# Patient Record
Sex: Male | Born: 1965 | Race: White | Hispanic: No | Marital: Single | Smoking: Never smoker
Health system: Southern US, Community
[De-identification: ages and names within clinical notes are randomized; demographics above are authoritative.]

## PROBLEM LIST (undated history)

## (undated) DIAGNOSIS — E785 Hyperlipidemia, unspecified: Secondary | ICD-10-CM

## (undated) DIAGNOSIS — E7800 Pure hypercholesterolemia, unspecified: Secondary | ICD-10-CM

## (undated) DIAGNOSIS — M549 Dorsalgia, unspecified: Secondary | ICD-10-CM

## (undated) DIAGNOSIS — I2510 Atherosclerotic heart disease of native coronary artery without angina pectoris: Secondary | ICD-10-CM

## (undated) HISTORY — DX: Pure hypercholesterolemia, unspecified: E78.00

## (undated) HISTORY — PX: BACK SURGERY: SHX140

## (undated) HISTORY — PX: SHUNT REPLACEMENT: SHX5403

## (undated) HISTORY — PX: CLUB FOOT RELEASE: SHX1363

---

## 1999-12-01 ENCOUNTER — Encounter: Admission: RE | Admit: 1999-12-01 | Discharge: 1999-12-01 | Payer: Self-pay | Admitting: Neurosurgery

## 1999-12-09 ENCOUNTER — Inpatient Hospital Stay (HOSPITAL_COMMUNITY): Admission: RE | Admit: 1999-12-09 | Discharge: 1999-12-11 | Payer: Self-pay | Admitting: Neurosurgery

## 1999-12-10 ENCOUNTER — Encounter: Payer: Self-pay | Admitting: Neurological Surgery

## 2000-01-01 ENCOUNTER — Encounter: Admission: RE | Admit: 2000-01-01 | Discharge: 2000-01-01 | Payer: Self-pay | Admitting: Neurosurgery

## 2000-02-12 ENCOUNTER — Encounter: Admission: RE | Admit: 2000-02-12 | Discharge: 2000-02-12 | Payer: Self-pay | Admitting: Neurosurgery

## 2000-04-15 ENCOUNTER — Encounter: Admission: RE | Admit: 2000-04-15 | Discharge: 2000-04-15 | Payer: Self-pay | Admitting: Neurosurgery

## 2000-07-15 ENCOUNTER — Encounter: Admission: RE | Admit: 2000-07-15 | Discharge: 2000-07-15 | Payer: Self-pay | Admitting: Neurosurgery

## 2001-01-17 ENCOUNTER — Encounter: Admission: RE | Admit: 2001-01-17 | Discharge: 2001-01-17 | Payer: Self-pay | Admitting: Neurosurgery

## 2002-02-06 ENCOUNTER — Emergency Department (HOSPITAL_COMMUNITY): Admission: EM | Admit: 2002-02-06 | Discharge: 2002-02-06 | Payer: Self-pay | Admitting: Emergency Medicine

## 2006-06-15 ENCOUNTER — Emergency Department (HOSPITAL_COMMUNITY): Admission: EM | Admit: 2006-06-15 | Discharge: 2006-06-15 | Payer: Self-pay | Admitting: Emergency Medicine

## 2006-07-04 ENCOUNTER — Emergency Department (HOSPITAL_COMMUNITY): Admission: EM | Admit: 2006-07-04 | Discharge: 2006-07-04 | Payer: Self-pay | Admitting: Emergency Medicine

## 2006-07-06 ENCOUNTER — Emergency Department (HOSPITAL_COMMUNITY): Admission: EM | Admit: 2006-07-06 | Discharge: 2006-07-06 | Payer: Self-pay | Admitting: Emergency Medicine

## 2006-07-08 ENCOUNTER — Ambulatory Visit (HOSPITAL_COMMUNITY): Admission: RE | Admit: 2006-07-08 | Discharge: 2006-07-08 | Payer: Self-pay | Admitting: Urology

## 2006-11-26 ENCOUNTER — Emergency Department (HOSPITAL_COMMUNITY): Admission: EM | Admit: 2006-11-26 | Discharge: 2006-11-26 | Payer: Self-pay | Admitting: Emergency Medicine

## 2010-11-15 ENCOUNTER — Encounter: Payer: Self-pay | Admitting: Urology

## 2010-11-15 ENCOUNTER — Encounter: Payer: Self-pay | Admitting: Family Medicine

## 2012-10-02 ENCOUNTER — Observation Stay (HOSPITAL_COMMUNITY)
Admission: EM | Admit: 2012-10-02 | Discharge: 2012-10-02 | Disposition: A | Discharge status: Home / Self Care | Payer: Medicare Other | Attending: Emergency Medicine | Admitting: Emergency Medicine

## 2012-10-02 ENCOUNTER — Emergency Department (HOSPITAL_COMMUNITY): Payer: Medicare Other

## 2012-10-02 ENCOUNTER — Encounter (HOSPITAL_COMMUNITY): Payer: Self-pay | Admitting: Emergency Medicine

## 2012-10-02 ENCOUNTER — Observation Stay (HOSPITAL_COMMUNITY): Payer: Medicare Other

## 2012-10-02 DIAGNOSIS — R0602 Shortness of breath: Secondary | ICD-10-CM | POA: Insufficient documentation

## 2012-10-02 DIAGNOSIS — I2510 Atherosclerotic heart disease of native coronary artery without angina pectoris: Secondary | ICD-10-CM

## 2012-10-02 DIAGNOSIS — Z79899 Other long term (current) drug therapy: Secondary | ICD-10-CM | POA: Insufficient documentation

## 2012-10-02 DIAGNOSIS — R911 Solitary pulmonary nodule: Secondary | ICD-10-CM

## 2012-10-02 DIAGNOSIS — Z7982 Long term (current) use of aspirin: Secondary | ICD-10-CM | POA: Insufficient documentation

## 2012-10-02 DIAGNOSIS — R079 Chest pain, unspecified: Principal | ICD-10-CM | POA: Insufficient documentation

## 2012-10-02 HISTORY — DX: Dorsalgia, unspecified: M54.9

## 2012-10-02 LAB — CBC
HCT: 37.3 % — ABNORMAL LOW (ref 39.0–52.0)
Hemoglobin: 12.3 g/dL — ABNORMAL LOW (ref 13.0–17.0)
MCHC: 33 g/dL (ref 30.0–36.0)
MCV: 74.7 fL — ABNORMAL LOW (ref 78.0–100.0)
RDW: 16 % — ABNORMAL HIGH (ref 11.5–15.5)

## 2012-10-02 LAB — POCT I-STAT, CHEM 8
Creatinine, Ser: 0.8 mg/dL (ref 0.50–1.35)
HCT: 39 % (ref 39.0–52.0)
Hemoglobin: 13.3 g/dL (ref 13.0–17.0)
Potassium: 3.3 mEq/L — ABNORMAL LOW (ref 3.5–5.1)
Sodium: 134 mEq/L — ABNORMAL LOW (ref 135–145)
TCO2: 25 mmol/L (ref 0–100)

## 2012-10-02 LAB — POCT I-STAT TROPONIN I
Troponin i, poc: 0 ng/mL (ref 0.00–0.08)
Troponin i, poc: 0.01 ng/mL (ref 0.00–0.08)
Troponin i, poc: 0 ng/mL (ref 0.00–0.08)

## 2012-10-02 LAB — BASIC METABOLIC PANEL
BUN: 11 mg/dL (ref 6–23)
Chloride: 96 mEq/L (ref 96–112)
Creatinine, Ser: 0.85 mg/dL (ref 0.50–1.35)
GFR calc Af Amer: >90 mL/min (ref >90)
GFR calc non Af Amer: >90 mL/min (ref >90)
Glucose, Bld: 122 mg/dL — ABNORMAL HIGH (ref 70–99)
Potassium: 3.2 mEq/L — ABNORMAL LOW (ref 3.5–5.1)

## 2012-10-02 LAB — D-DIMER, QUANTITATIVE: D-Dimer, Quant: <0.27 ug/mL-FEU (ref 0.00–0.48)

## 2012-10-02 LAB — PROTIME-INR: INR: 1.04 (ref 0.00–1.49)

## 2012-10-02 MED ORDER — IOHEXOL 350 MG/ML SOLN
80.0000 mL | Freq: Once | INTRAVENOUS | Status: AC | PRN
  Administered 2012-10-02: 80 mL via INTRAVENOUS

## 2012-10-02 MED ORDER — NITROGLYCERIN 0.4 MG SL SUBL
SUBLINGUAL_TABLET | SUBLINGUAL | Status: AC
  Administered 2012-10-02: 16:00:00
  Filled 2012-10-02: qty 25

## 2012-10-02 MED ORDER — ASPIRIN 81 MG PO CHEW
324.0000 mg | CHEWABLE_TABLET | Freq: Once | ORAL | Status: AC
  Administered 2012-10-02: 324 mg via ORAL
  Filled 2012-10-02: qty 4

## 2012-10-02 MED ORDER — SODIUM CHLORIDE 0.9 % IV SOLN: Freq: Once | INTRAVENOUS | Status: DC

## 2012-10-02 MED ORDER — SODIUM CHLORIDE 0.9 % IV SOLN
Freq: Once | INTRAVENOUS | Status: AC
  Administered 2012-10-02: 04:00:00 via INTRAVENOUS

## 2012-10-02 MED ORDER — METOPROLOL TARTRATE 25 MG PO TABS
50.0000 mg | ORAL_TABLET | Freq: Once | ORAL | Status: AC
  Administered 2012-10-02: 50 mg via ORAL
  Filled 2012-10-02: qty 2

## 2012-10-02 MED ORDER — MORPHINE SULFATE 4 MG/ML IJ SOLN: 4.0000 mg | INTRAMUSCULAR | Status: DC | PRN

## 2012-10-02 MED ORDER — MORPHINE SULFATE 4 MG/ML IJ SOLN
4.0000 mg | Freq: Once | INTRAMUSCULAR | Status: AC
  Administered 2012-10-02: 4 mg via INTRAVENOUS
  Filled 2012-10-02: qty 1

## 2012-10-02 MED ORDER — METOPROLOL TARTRATE 25 MG PO TABS
100.0000 mg | ORAL_TABLET | Freq: Once | ORAL | Status: AC
  Administered 2012-10-02: 100 mg via ORAL
  Filled 2012-10-02: qty 4

## 2012-10-02 MED ORDER — ONDANSETRON HCL 4 MG/2ML IJ SOLN: 4.0000 mg | Freq: Once | INTRAMUSCULAR | Status: DC

## 2012-10-02 MED ORDER — METOPROLOL TARTRATE 1 MG/ML IV SOLN
5.0000 mg | Freq: Once | INTRAVENOUS | Status: DC
  Filled 2012-10-02: qty 5

## 2012-10-02 NOTE — ED Notes (Signed)
Family at bedside. 

## 2012-10-02 NOTE — ED Notes (Signed)
Patient is alert and orientedx4.  Patient was explained discharge instructions and he understood them.  Patient's sister, Minerva Ends was also present during the explanation.  One of his family members are transporting him home.

## 2012-10-02 NOTE — ED Notes (Signed)
C/o pressure in center of chest that started 30 min ago.  States he also had pain a couple nights ago.  Denies nausea, vomiting, sob, or any other symptoms.

## 2012-10-02 NOTE — ED Notes (Signed)
Medications ordered at 1730 not given, [place], PA discharged the patient.

## 2012-10-02 NOTE — ED Provider Notes (Signed)
Cody Preston is a 46 y.o. male transferred to CDU from Pod B. Signout from [facility] as follows: Patient with chest pain at rest onset this a.m. he has no cardiac risk factors except for a strong family history. He is on CDU chest pain protocol pending coronary CT.  Verbal report from radiologist Dr. entered and appreciated: He states that the patient has extensive but nonobstructive coronary artery disease with a calcium score of over 500 this is in the 99th percentile. This does not account for the patient's symptoms. Incidentally a 6 mm right middle lobe pulmonary nodule is noted. Patient will be advised to follow with pulmonology and have a repeat chest CT in 12 months.  Cardiology consult from Dr. Sharyn Lull appreciated: He feels no emergent intervention or hospitalization is indicated at this time. He would like to see the patient in his office next week. Advises pulmonary followup for nodule.  Pt seen and examined at the bedside he is resting comfortably with his family at the bedside. He is currently chest pain-free. Physical exam shows heart rate is a regular rate and rhythm, lung sounds are clear to auscultation, abdominal exam is benign.  Results discussed with patient and family.  Followup with Dr. Sharyn Lull in the next week and and pulmonology referral for repeat chest CT in 6 months to evaluate pulmonary nodule as patient has risk factors of asbestos exposure.   Pt has been advised return to the ED if develop any exertional CP- strict return precautions discussed & patient's questions answered. Pt appears reliable for follow up and is agreeable to discharge.   Results for orders placed during the hospital encounter of 10/02/12  CBC      Component Value Range   WBC 6.1  4.0 - 10.5 K/uL   RBC 4.99  4.22 - 5.81 MIL/uL   Hemoglobin 12.3 (*) 13.0 - 17.0 g/dL   HCT 82.9 (*) 56.2 - 13.0 %   MCV 74.7 (*) 78.0 - 100.0 fL   MCH 24.6 (*) 26.0 - 34.0 pg   MCHC 33.0  30.0 - 36.0 g/dL   RDW 86.5 (*)  78.4 - 15.5 %   Platelets 438 (*) 150 - 400 K/uL  BASIC METABOLIC PANEL      Component Value Range   Sodium 132 (*) 135 - 145 mEq/L   Potassium 3.2 (*) 3.5 - 5.1 mEq/L   Chloride 96  96 - 112 mEq/L   CO2 27  19 - 32 mEq/L   Glucose, Bld 122 (*) 70 - 99 mg/dL   BUN 11  6 - 23 mg/dL   Creatinine, Ser 6.96  0.50 - 1.35 mg/dL   Calcium 9.1  8.4 - 29.5 mg/dL   GFR calc non Af Amer >90  >90 mL/min   GFR calc Af Amer >90  >90 mL/min  APTT      Component Value Range   aPTT 35  24 - 37 seconds  PROTIME-INR      Component Value Range   Prothrombin Time 13.5  11.6 - 15.2 seconds   INR 1.04  0.00 - 1.49  D-DIMER, QUANTITATIVE      Component Value Range   D-Dimer, Quant <0.27  0.00 - 0.48 ug/mL-FEU  POCT I-STAT TROPONIN I      Component Value Range   Troponin i, poc 0.00  0.00 - 0.08 ng/mL   Comment 3           POCT I-STAT, CHEM 8  Component Value Range   Sodium 134 (*) 135 - 145 mEq/L   Potassium 3.3 (*) 3.5 - 5.1 mEq/L   Chloride 97  96 - 112 mEq/L   BUN 11  6 - 23 mg/dL   Creatinine, Ser 8.11  0.50 - 1.35 mg/dL   Glucose, Bld 914 (*) 70 - 99 mg/dL   Calcium, Ion 7.82 (*) 1.12 - 1.23 mmol/L   TCO2 25  0 - 100 mmol/L   Hemoglobin 13.3  13.0 - 17.0 g/dL   HCT 95.6  21.3 - 08.6 %  POCT I-STAT TROPONIN I      Component Value Range   Troponin i, poc 0.01  0.00 - 0.08 ng/mL   Comment 3           POCT I-STAT TROPONIN I      Component Value Range   Troponin i, poc 0.00  0.00 - 0.08 ng/mL   Comment 3            Ct Heart Morp W/cta Cor W/score W/ca W/cm &/or Wo/cm  10/02/2012  *RADIOLOGY REPORT*  INDICATION: 46 year old male with history of intermittent chest pressure.  CT ANGIOGRAPHY OF THE HEART, CORONARY ARTERY, STRUCTURE, AND MORPHOLOGY  COMPARISON:  No priors.  CONTRAST: 80mL OMNIPAQUE IOHEXOL 350 MG/ML SOLN  TECHNIQUE:  CT angiography of the coronary vessels was performed on a 256 channel system using prospective ECG gating.  A scout and ECG- gated noncontrast exam (for calcium  scoring) were performed. Appropriate delay was determined by bolus tracking after injection of iodinated contrast, and an ECG-gated coronary CTA was performed with sub-mm slice collimation during late diastole.  Imaging post processing was performed on an independent workstation creating multiplanar and 3-D images, allowing for quantitative analysis of the heart and coronary arteries.  Note that this exam targets the heart and the chest was not imaged in its entirety.  PREMEDICATION: Lopressor  150 mg, P.O. Nitroglycerin  400 mcg, sublingual.  FINDINGS: Technical quality: Excellent. Heart rate:  62.  CORONARY ARTERIES: Left Main:              Mildly diseased with predominately noncalcified plaque resulting in 0-25% stenosis. LAD:              Heavily diseased with a larger burden of the calcified and noncalcified atherosclerotic plaque with areas of stenosis of 25-50% in the mid LAD.  A short segment of shallow myocardial bridging the distal LAD without any associated luminal narrowing of the tunneled segment of the vessel on these diastolic images. Diagonals:              Dimminutive D1, negative for atherosclerosis.  Large-caliber D2 with mild disease at the ostium resulting in 25-50% stenosis. LCx:              Mixed plaque at the ostium resulting in 0-25% stenosis. OMs:              Large-caliber OM1 with mixed plaque in the proximal vessel resulting in 25-50% stenosis.  Small caliber OM2, negative for atherosclerosis. RCA:              Moderately diseased with mixed calcified and noncalcified atherosclerotic plaque with areas of stenosis of 25- 50% in the mid RCA. PDA:                    Negative. Dominance:        Right.  CORONARY CALCIUM:  Total Agatston Score:  528 MESA database percentile:     99th  AORTA AND PULMONARY MEASUREMENTS:  Aortic root (21 - 40 mm):     26 mm at the annulus                               35 mm at the sinuses of Valsalva                               24 mm at the sinotubular   junction                                Ascending aorta:  (< 40 mm):  27 mm Descending aorta:  (< 40 mm): 21 mm Main pulmonary artery: (< 30 mm): 24 mm  OTHER FINDINGS: 6 mm subpleural nodule in the right middle lobe (image 22 of series 5).  Extensive scarring in the lung bases bilaterally (right lower lobe greater than left).  No acute consolidative air space disease. No pleural effusions.  Small amount pleural thickening posteriorly bilaterally (right greater than left), likely part of the underlying scarring.  Visualized portions of the upper abdomen are unremarkable.  There are no aggressive appearing lytic or blastic lesions noted in the visualized portions of the skeleton.  IMPRESSION: 1.  Extensive age advanced nonobstructive coronary artery disease, as detailed above.  The patient's total coronary artery calcium score is 528 which is 99th percentile for patient's of matched age, gender and race/ethnicity. 2.  No acute findings in the thorax to account for the patient's symptoms. 3.  6 mm subpleural nodule in the right middle lobe.  This is nonspecific, but does warrant attention on follow-up studies. If the patient is at high risk for bronchogenic carcinoma, follow-up chest CT at 6-12 months is recommended.  If the patient is at low risk for bronchogenic carcinoma, follow-up chest CT at 12 months is recommended.  This recommendation follows the consensus statement: Guidelines for Management of Small Pulmonary Nodules Detected on CT Scans: A Statement from the Fleischner Society as published in Radiology 2005; 237:395-400. 4.  Right coronary artery dominance.  Report was called to CDU mid level at [phone] at 04:45 p.m. on 10/02/2012.   Original Report Authenticated By: Trudie Reed, M.D.    Dg Chest Port 1 View  10/02/2012  *RADIOLOGY REPORT*  Clinical Data: Chest pain  PORTABLE CHEST - 1 VIEW  Comparison: None.  Findings: Linear atelectasis or fibrosis in the right lung base. The heart size and  pulmonary vascularity are normal. The lungs appear clear and expanded without focal air space disease or consolidation. No blunting of the costophrenic angles.  No pneumothorax.  Mediastinal contours appear intact.  IMPRESSION: No evidence of active pulmonary disease.  Linear atelectasis or fibrosis in the right lung base.   Original Report Authenticated By: Burman Nieves, M.D.         Wynetta Emery, PA-C 10/02/12 1755

## 2012-10-02 NOTE — Progress Notes (Signed)
Utilization review completed.  P.J. Onalee Steinbach,RN,BSN Case Manager [phone]  

## 2012-10-02 NOTE — ED Notes (Signed)
Cardiology paged.  

## 2012-10-02 NOTE — ED Notes (Signed)
Per MD pt to be given 50mg  lopressor orally and ct scan to be done at 3pm today

## 2012-10-02 NOTE — ED Notes (Signed)
Pt ambulated to restroom without assistance.

## 2012-10-02 NOTE — ED Notes (Addendum)
Into room via w/c, EDP at [facility], alert, NAD, calm, interactive, skin W&D, hands cold, resps e/u, c/o CP, onset 2d ago, comes and goes, describes as pressure,  worse when lying down, "feels better now than previously", h/o chronic back pain, officially disabled, "had just gone to bed around 0315". (Denies other sx), tender to sternal palpation, reproduceable. PCP Dr. Loleta Chance. Mentions h/o PE. Family h/o MI.

## 2012-10-02 NOTE — ED Notes (Signed)
Xray at BS 

## 2012-10-02 NOTE — ED Provider Notes (Signed)
History     CSN: [id number]  Arrival date & time 10/02/12  0348   First MD Initiated Contact with Patient 10/02/12 0356      Chief Complaint  Patient presents with  . Chest Pain    (Consider location/radiation/quality/duration/timing/severity/associated sxs/prior treatment) HPI Comments: Pt is a 46 y/o male who states that he has a hx of Fractured back and Pelvis, PE (after back fracture), and a very strong hx of ACS in the family with multiple family members who have had MI's.  He states that he has had a heaviness in his chest which has been intermittent, started 3 days ago but became more intense this evening prompting his visit.  He has had mild SOB with this but no nausea or diaphoresis.  He has taken Hydrocodone prior to arrival with good relief of his pain and he has very mild pain at this time.  He denies f/c/n/v/cough/swelling / trauma / travel / immobilization / hormone use or hx of CA.  PMD: Mirna Mires  Patient is a 46 y.o. male presenting with chest pain. The history is provided by the patient and medical records.  Chest Pain     Past Medical History  Diagnosis Date  . Back pain     Past Surgical History  Procedure Date  . Back surgery     No family history on file.  History  Substance Use Topics  . Smoking status: Never Smoker   . Smokeless tobacco: Not on file  . Alcohol Use: No      Review of Systems  Cardiovascular: Positive for chest pain.  All other systems reviewed and are negative.    Allergies  Review of patient's allergies indicates no known allergies.  Home Medications   Current Outpatient Rx  Name  Route  Sig  Dispense  Refill  . ASPIRIN EC 81 MG PO TBEC   Oral   Take 81 mg by mouth daily.         . IBUPROFEN 200 MG PO TABS   Oral   Take 200 mg by mouth every 6 (six) hours as needed. For pain         . TRAMADOL HCL 50 MG PO TABS   Oral   Take 50 mg by mouth every 8 (eight) hours as needed. For back pain            BP 132/92  Pulse 83  Temp 98.4 F (36.9 C) (Oral)  Resp 18  SpO2 100%  Physical Exam  Nursing note and vitals reviewed. Constitutional: He appears well-developed and well-nourished. No distress.  HENT:  Head: Normocephalic and atraumatic.  Mouth/Throat: Oropharynx is clear and moist. No oropharyngeal exudate.       Small non tender non erythematous growth to the L anterior chin (states has been there for some time)  Eyes: Conjunctivae normal and EOM are normal. Pupils are equal, round, and reactive to light. Right eye exhibits no discharge. Left eye exhibits no discharge. No scleral icterus.  Neck: Normal range of motion. Neck supple. No JVD present. No thyromegaly present.  Cardiovascular: Normal rate, regular rhythm, normal heart sounds and intact distal pulses.  Exam reveals no gallop and no friction rub.   No murmur heard. Pulmonary/Chest: Effort normal and breath sounds normal. No respiratory distress. He has no wheezes. He has no rales.  Abdominal: Soft. Bowel sounds are normal. He exhibits no distension and no mass. There is no tenderness.  Musculoskeletal: Normal range of motion. He exhibits  no edema and no tenderness.  Lymphadenopathy:    He has no cervical adenopathy.  Neurological: He is alert. Coordination normal.  Skin: Skin is warm and dry. No rash noted. No erythema.  Psychiatric: He has a normal mood and affect. His behavior is normal.    ED Course  Procedures (including critical care time)  Labs Reviewed  CBC - Abnormal; Notable for the following:    Hemoglobin 12.3 (*)     HCT 37.3 (*)     MCV 74.7 (*)     MCH 24.6 (*)     RDW 16.0 (*)     Platelets 438 (*)     All other components within normal limits  BASIC METABOLIC PANEL - Abnormal; Notable for the following:    Sodium 132 (*)     Potassium 3.2 (*)     Glucose, Bld 122 (*)     All other components within normal limits  POCT I-STAT, CHEM 8 - Abnormal; Notable for the following:    Sodium 134  (*)     Potassium 3.3 (*)     Glucose, Bld 124 (*)     Calcium, Ion 1.11 (*)     All other components within normal limits  APTT  PROTIME-INR  D-DIMER, QUANTITATIVE  POCT I-STAT TROPONIN I  POCT I-STAT TROPONIN I  POCT I-STAT TROPONIN I   Dg Chest Port 1 View  10/02/2012  *RADIOLOGY REPORT*  Clinical Data: Chest pain  PORTABLE CHEST - 1 VIEW  Comparison: None.  Findings: Linear atelectasis or fibrosis in the right lung base. The heart size and pulmonary vascularity are normal. The lungs appear clear and expanded without focal air space disease or consolidation. No blunting of the costophrenic angles.  No pneumothorax.  Mediastinal contours appear intact.  IMPRESSION: No evidence of active pulmonary disease.  Linear atelectasis or fibrosis in the right lung base.   Original Report Authenticated By: Burman Nieves, M.D.      1. Chest pain       MDM  The pt has no edema, he does have a tachycardia on the monitor to 105, his ECG is non ischemic but BP is elevated at 145/90, and the rest of his exam is benign.  Will need to r/o PE, but has strong FHx of ACS - would benefit from r/o if not a PE.   Pt does not have any sx at this time - he has had 2 neg sets of CE's but due to his FHx he will receive a Cardiac Coronary CT this AM.  Holding orders written.  Change of shift - care signed out to oncoming physician and PA     Vida Roller, MD 10/02/12 [phone]

## 2012-10-03 NOTE — ED Provider Notes (Signed)
Medical screening examination/treatment/procedure(s) were performed by non-physician practitioner and as supervising physician I was immediately available for consultation/collaboration.   Glynn Octave, MD 10/03/12 [phone]

## 2012-12-28 ENCOUNTER — Telehealth: Payer: Self-pay | Admitting: Internal Medicine

## 2012-12-28 NOTE — Telephone Encounter (Signed)
Patient called stating he needed to be seen because of his heart.  I have looked through patients chart and according to [facility] in ED patient had cardiac workup and CT done which showed a pulmonary nodule and rec patient see pulmonologist. Appt scheduled for 12/29/12 @ 145pm w Dr.Wert.

## 2012-12-29 ENCOUNTER — Institutional Professional Consult (permissible substitution): Payer: 59 | Admitting: Internal Medicine

## 2013-01-11 ENCOUNTER — Encounter: Payer: Self-pay | Admitting: Cardiology

## 2013-01-16 ENCOUNTER — Encounter: Payer: Self-pay | Admitting: Cardiovascular Disease

## 2013-01-16 ENCOUNTER — Ambulatory Visit (INDEPENDENT_AMBULATORY_CARE_PROVIDER_SITE_OTHER): Payer: Medicare Other | Admitting: Cardiovascular Disease

## 2013-01-16 VITALS — BP 136/86 | HR 112 | Ht 67.0 in | Wt 153.2 lb

## 2013-01-16 DIAGNOSIS — E785 Hyperlipidemia, unspecified: Secondary | ICD-10-CM

## 2013-01-16 DIAGNOSIS — E789 Disorder of lipoprotein metabolism, unspecified: Secondary | ICD-10-CM | POA: Insufficient documentation

## 2013-01-16 DIAGNOSIS — I2510 Atherosclerotic heart disease of native coronary artery without angina pectoris: Secondary | ICD-10-CM

## 2013-01-16 DIAGNOSIS — I25119 Atherosclerotic heart disease of native coronary artery with unspecified angina pectoris: Secondary | ICD-10-CM | POA: Insufficient documentation

## 2013-01-16 MED ORDER — SIMVASTATIN 20 MG PO TABS: 20.0000 mg | ORAL_TABLET | Freq: Every day | ORAL | Status: DC

## 2013-01-16 NOTE — Progress Notes (Signed)
Patient ID: Cody Preston, male   DOB: October 14, 1966, 47 y.o.   MRN: [id number] 47 yo with positive family history of CAD.  Seen in ER 12/13 for chest pain.  R/O ECG normal. Reviewed cardiac CT read by Dr George Ina. Calcium score over 500 but no obstructive disease. Has not had SSCP since. Concerned about his heart. Primary ? Mirna Mires.  Says he's had lipids checked but not on statin. Nonsmoker non diabetic He is sedentary and does not work. Larey Seat form 30 feet 15 years ago and has back problems.    ROS: Denies fever, malais, weight loss, blurry vision, decreased visual acuity, cough, sputum, SOB, hemoptysis, pleuritic pain, palpitaitons, heartburn, abdominal pain, melena, lower extremity edema, claudication, or rash.  All other systems reviewed and negative   General: Affect appropriate Healthy:  appears stated age HEENT: normal poor dentition Neck supple with no adenopathy JVP normal no bruits no thyromegaly Lungs clear with no wheezing and good diaphragmatic motion Heart:  S1/S2 no murmur,rub, gallop or click PMI normal Abdomen: benighn, BS positve, no tenderness, no AAA no bruit.  No HSM or HJR Distal pulses intact with no bruits No edema Neuro non-focal Skin warm and dry No muscular weakness  Medications Current Outpatient Prescriptions  Medication Sig Dispense Refill  . Acetaminophen (ARTHRITIS PAIN RELIEF PO) Take by mouth.      Marland Kitchen aspirin EC 81 MG tablet Take 81 mg by mouth daily.      . traMADol (ULTRAM) 50 MG tablet Take 50 mg by mouth every 8 (eight) hours as needed. For back pain      . simvastatin (ZOCOR) 20 MG tablet Take 1 tablet (20 mg total) by mouth at bedtime.  90 tablet  2   No current facility-administered medications for this visit.    Allergies Review of patient's allergies indicates no known allergies.  Family History: No family history on file.  Social History: History   Social History  . Marital Status: Single    Spouse Name: N/A    Number of Children:  N/A  . Years of Education: N/A   Occupational History  . Not on file.   Social History Main Topics  . Smoking status: Never Smoker   . Smokeless tobacco: Not on file  . Alcohol Use: No  . Drug Use: No  . Sexually Active:    Other Topics Concern  . Not on file   Social History Narrative  . No narrative on file    Electrocardiogram: 10/03/12  NSR normal ECG  Assessment and Plan

## 2013-01-16 NOTE — Patient Instructions (Addendum)
Your physician wants you to follow-up in: 6 months with Dr Eden Emms. You will receive a reminder letter in the mail two months in advance. If you don't receive a letter, please call our office to schedule the follow-up appointment.  Your physician has recommended you make the following change in your medication: START Simvastatin 20 mg daily  Your physician recommends that you return for lab work in: 6-8 weeks (lipid and liver profiles)

## 2013-01-16 NOTE — Assessment & Plan Note (Signed)
Nonobstructive but calcium score 99% for age and sex matched controls Start statin check labs in 8 weeks and take ASA 81mg 

## 2013-01-16 NOTE — Assessment & Plan Note (Signed)
Start statin target LDL less than 70 labs in 8 weeks

## 2013-01-19 ENCOUNTER — Institutional Professional Consult (permissible substitution): Payer: 59 | Admitting: Cardiology

## 2013-01-24 ENCOUNTER — Institutional Professional Consult (permissible substitution): Payer: 59 | Admitting: Internal Medicine

## 2013-02-23 ENCOUNTER — Encounter: Payer: Self-pay | Admitting: Critical Care Medicine

## 2013-02-23 ENCOUNTER — Ambulatory Visit (INDEPENDENT_AMBULATORY_CARE_PROVIDER_SITE_OTHER): Payer: Medicare Other | Admitting: Critical Care Medicine

## 2013-02-23 ENCOUNTER — Institutional Professional Consult (permissible substitution): Payer: Medicare Other | Admitting: Internal Medicine

## 2013-02-23 VITALS — BP 138/90 | HR 94 | Temp 97.6°F | Ht 67.0 in | Wt 149.0 lb

## 2013-02-23 DIAGNOSIS — R911 Solitary pulmonary nodule: Secondary | ICD-10-CM | POA: Insufficient documentation

## 2013-02-23 NOTE — Patient Instructions (Signed)
OBtain CT CHest at [facility] location I will call with results

## 2013-02-23 NOTE — Progress Notes (Signed)
Subjective:    Patient ID: Cody Preston, male    DOB: 04/22/1966, Cody y.o.   MRN: [id number]  HPI Cody y.o. WM hx of lung nodule seen on coronary artery CT done 09/2012.  Cody Preston had recent abd CT that confirmed and referred for further Eval. 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 No real cough.  This will spasm.  No edema in feet.  No wheeze.  Notes some heartburn.   Never smoker.   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 Cody Preston also denies any obvious fluctuation in symptoms with  weather or environmental change or other alleviating or aggravating factors  Past Medical History  Diagnosis Date  . Back pain   . Hypercholesteremia      Family History  Problem Relation Age of Onset  . Allergies Sister   . Heart attack Mother   . Heart attack Brother   . Arthritis Mother   . Arthritis Sister   . Lung cancer Mother      History   Social History  . Marital Status: Single    Spouse Name: N/A    Number of Children: N/A  . Years of Education: N/A   Occupational History  . Retired    Social History Main Topics  . Smoking status: Never Smoker   . Smokeless tobacco: Never Used  . Alcohol Use: Yes     Comment: occas  . Drug Use: No  . Sexually Active: Not on file   Other Topics Concern  . Not on file   Social History Narrative  . No narrative on file     No Known Allergies   Outpatient Prescriptions Prior to Visit  Medication Sig Dispense Refill  . Acetaminophen (ARTHRITIS PAIN RELIEF PO) Take by mouth as needed.       Marland Kitchen aspirin EC 81  MG tablet Take 81 mg by mouth daily.      . simvastatin (ZOCOR) 20 MG tablet Take 1 tablet (20 mg total) by mouth at bedtime.  90 tablet  2  . traMADol (ULTRAM) 50 MG tablet Take 50 mg by mouth every 8 (eight) hours as needed. For back pain       No facility-administered medications prior to visit.      Review of Systems  Constitutional: Negative for fever, chills, diaphoresis, activity change, appetite change, fatigue and unexpected weight change.  HENT: Negative for hearing loss, ear pain, nosebleeds, congestion, sore throat, facial swelling, rhinorrhea, sneezing, mouth sores, trouble swallowing, neck pain, neck stiffness, dental problem, voice change, postnasal drip, sinus pressure, tinnitus and ear discharge.   Eyes: Negative for photophobia, discharge, itching and visual disturbance.  Respiratory: Negative for apnea, cough, choking, chest tightness, shortness of breath, wheezing and stridor.   Cardiovascular: Negative for chest pain, palpitations and leg swelling.  Gastrointestinal: Negative for nausea, vomiting, abdominal pain, constipation, blood in stool and abdominal distention.  Genitourinary: Negative for dysuria, urgency, frequency, hematuria, flank pain, decreased urine volume and difficulty urinating.  Musculoskeletal: Positive for back pain. Negative for myalgias, joint swelling, arthralgias and gait problem.  Skin: Negative for color  change, pallor and rash.  Neurological: Negative for dizziness, tremors, seizures, syncope, speech difficulty, weakness, light-headedness, numbness and headaches.  Hematological: Negative for adenopathy. Does not bruise/bleed easily.  Psychiatric/Behavioral: Negative for confusion, sleep disturbance and agitation. The patient is not nervous/anxious.        Objective:   Physical Exam Filed Vitals:   02/23/13 1631  BP: 138/90  Pulse: 94  Temp: 97.6 F (36.4 C)  TempSrc: Oral  Height: 5\' 7"  (1.702 m)  Weight: 149 lb (67.586 kg)  SpO2: 98%     Gen: Pleasant, well-nourished, in no distress,  normal affect  ENT: No lesions,  mouth clear,  oropharynx clear, no postnasal drip  Neck: No JVD, no TMG, no carotid bruits  Lungs: No use of accessory muscles, no dullness to percussion, clear without rales or rhonchi  Cardiovascular: RRR, heart sounds normal, no murmur or gallops, no peripheral edema  Abdomen: soft and NT, no HSM,  BS normal  Musculoskeletal: No deformities, no cyanosis or clubbing  Neuro: alert, mild left sided weakness in LUE  Skin: Warm, no lesions or rashes  No results found.   CT Chest 09/2012: done for CAD eval.  6mm anterior subpleural nodule RUL no other nodules or LAN seen     Assessment & Plan:   Lung nodule 6mm Anterior sub pleural nodule seen on ABD CT and Coronary artery CT 09/2012. 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 Acetaminophen (ARTHRITIS PAIN RELIEF PO) Take by mouth as needed.       Marland Kitchen aspirin EC 81 MG tablet Take 81 mg by mouth daily.      . simvastatin (ZOCOR) 20 MG tablet Take 1 tablet (20 mg total) by mouth at bedtime.  90 tablet  2  . traMADol (ULTRAM) 50 MG tablet Take 50 mg by mouth every 8 (eight) hours as needed. For back pain       No facility-administered encounter medications on file as of 02/23/2013.

## 2013-02-24 NOTE — Assessment & Plan Note (Signed)
6mm Anterior sub pleural nodule seen on ABD CT and Coronary artery CT 09/2012. 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

## 2013-02-27 ENCOUNTER — Other Ambulatory Visit: Payer: Medicare Other

## 2013-03-02 ENCOUNTER — Ambulatory Visit (INDEPENDENT_AMBULATORY_CARE_PROVIDER_SITE_OTHER)
Admission: RE | Admit: 2013-03-02 | Discharge: 2013-03-02 | Disposition: A | Discharge status: Home / Self Care | Payer: Medicare Other | Source: Ambulatory Visit | Attending: Critical Care Medicine | Admitting: Critical Care Medicine

## 2013-03-02 DIAGNOSIS — R911 Solitary pulmonary nodule: Secondary | ICD-10-CM

## 2013-03-06 NOTE — Progress Notes (Signed)
Quick Note:  lmomtcb for pt ______ 

## 2013-03-08 NOTE — Progress Notes (Signed)
Quick Note:  Called, spoke with pt. Informed him of CT Chest results and recs per Dr. Delford Field. He verbalized understanding and voiced no further questions or concerns at this time. He is aware we will contact him closer to when scan is due next year to schedule. Dr. Delford Field has reminder for this in his box. ______

## 2013-03-29 ENCOUNTER — Ambulatory Visit: Payer: Medicare Other | Admitting: Critical Care Medicine

## 2014-03-07 ENCOUNTER — Telehealth: Payer: Self-pay | Admitting: *Deleted

## 2014-03-07 DIAGNOSIS — R911 Solitary pulmonary nodule: Secondary | ICD-10-CM

## 2014-03-07 NOTE — Telephone Encounter (Signed)
Message copied by Adalberto Ill on Thu Mar 07, 2014  1:53 PM ------      Message from: Asencion Noble E      Created: Thu Mar 07, 2014 11:33 AM       Needs f/u Ct chest on lung nodule, no contrast       ----- Message -----         From: Elsie Stain, MD         Sent: 03/05/2013   8:54 AM           To: Elsie Stain, MD            chk Ct chest       ------

## 2014-03-07 NOTE — Telephone Encounter (Signed)
ATC # listed as pt's home # - went directly to VM.  [facility] ATC # listed as cell #.  This # is not accepting calls at this time.  CT Chest order placed - will ned to inform pt PCCs will be calling to schedule this.

## 2014-03-11 NOTE — Telephone Encounter (Signed)
Looks like CT Chest is scheduled for June 8 ATC pt's home #- went directly to VM. lmomtcb ATC pt's cell # - went to [facility] stating # not accepting incoming calls. WCB

## 2014-03-12 NOTE — Telephone Encounter (Signed)
Called, spoke with pt. He is aware of pending CT Chest scheduled for June 8.  He is to call back if he has any further questions or concerns.  He voiced no further questions or concerns at this time.

## 2014-03-14 ENCOUNTER — Other Ambulatory Visit: Payer: Medicare Other

## 2014-04-01 ENCOUNTER — Other Ambulatory Visit: Payer: Medicare Other

## 2014-04-09 ENCOUNTER — Telehealth: Payer: Self-pay | Admitting: Critical Care Medicine

## 2014-04-14 NOTE — Telephone Encounter (Signed)
Opened in error

## 2014-06-04 ENCOUNTER — Inpatient Hospital Stay: Admission: RE | Admit: 2014-06-04 | Payer: Medicare Other | Source: Ambulatory Visit

## 2014-06-04 ENCOUNTER — Telehealth: Payer: Self-pay | Admitting: Critical Care Medicine

## 2014-06-04 NOTE — Telephone Encounter (Signed)
Yes. pls reschedule

## 2014-06-04 NOTE — Telephone Encounter (Signed)
PW do you want to set up a new appt date for CT?  Pt has cancelled the above dates for his CT>  thanks

## 2014-06-04 NOTE — Telephone Encounter (Signed)
Called pt's home #, [phone], it is not a working [employer] # listed as pt's cell # x 2, [phone], rang several times then received fast busy signal each call. WCB

## 2014-06-05 NOTE — Telephone Encounter (Signed)
ATC both home and cell #s Both numbers unavailable

## 2014-06-06 NOTE — Telephone Encounter (Signed)
ATC both home and cell #s again Both numbers unavailable

## 2014-06-07 NOTE — Telephone Encounter (Signed)
ATC again both home and cell #'s  Both NA

## 2014-06-11 NOTE — Telephone Encounter (Signed)
i have attempted to call pt again.  Both numbers are not working numbers at this time. Will sign off of this message at this time.

## 2014-06-20 ENCOUNTER — Encounter: Payer: Self-pay | Admitting: *Deleted

## 2014-06-20 NOTE — Telephone Encounter (Signed)
Noted  Pt not adherent to CT Scan followups.  Despite our efforts.

## 2014-06-20 NOTE — Telephone Encounter (Signed)
Called # listed as pt's cell # and pt's emergency contact #, [phone], received msg that the subscriber you are trying to call is not in service.  If you feel you have reached this msg in error please try your call again later. Called # listed as pt's home #, [phone], received msg that the number you have dialed is not a working number. There are no other contact numbers for pt in chart. I will sign off on msg and will send letter to address on file for pt.  Will send msg to Dr. Joya Gaskins as Juluis Rainier - pt has cancelled CT chest x 3.  No pending appts -- We have been unable to contact him to reschedule.

## 2014-06-20 NOTE — Telephone Encounter (Signed)
Letter mailed to pt.  

## 2014-06-27 ENCOUNTER — Telehealth: Payer: Self-pay | Admitting: Critical Care Medicine

## 2014-06-27 NOTE — Telephone Encounter (Signed)
Crystal  Was this just about updating the pts #'s and the CT scan that needs to be scheduled.  Anything further needed?  Please advise. thanks

## 2014-06-28 NOTE — Telephone Encounter (Signed)
Pt was contacted by Cody Preston at [facility] CT Dept and CT Chest was scheduled directly with patient for Wed 07/03/14 at 2:30 at [facility]. Pt advised by Cody Preston that he needs to keep this appointment. That pt has no showed at least 3 CT Chest appointments and the importance of keeping this appointment.  Nothing else needed at this time. Rhonda J Cobb

## 2014-06-28 NOTE — Telephone Encounter (Signed)
Need to speak with pt to see if he would like to reschedule CT.  It has been scheduled a few times and appt cancelled.  Called, spoke with pt.  Confirmed we have correct contact on file for him now.  Pt states he would like to proceed with having CT Chest scheduled.  He would like it done Wednesday or Thursday after lunch next week.  PCCs, please advise.  Thank you.  Pt aware we will call him back once scan is scheduled.

## 2014-07-03 ENCOUNTER — Telehealth: Payer: Self-pay | Admitting: Critical Care Medicine

## 2014-07-03 ENCOUNTER — Other Ambulatory Visit: Payer: Medicare Other

## 2014-07-03 NOTE — Telephone Encounter (Signed)
Noted and thanks.

## 2014-07-03 NOTE — Telephone Encounter (Signed)
Pt will reschedule this in the winter, not the fall per Rose at [facility] [address]

## 2014-07-03 NOTE — Telephone Encounter (Signed)
Per last CT results, recommendations were as follows: Notes Recorded by Elsie Stain, MD on 03/05/2013 at 8:54 AM Call the pt , Tell him CT shows likely benign lymph node unchanged. No other nodules rescan him in ONE YEAR  Pt cancelled CT appt and advised he will call and reschedule sometime in the winter. I will forward this as an FYI. [place] Bing, CMA

## 2014-07-11 ENCOUNTER — Telehealth: Payer: Self-pay | Admitting: Critical Care Medicine

## 2014-07-11 NOTE — Telephone Encounter (Addendum)
Pt had CT Chest on May 2014 with recs to have a repeat CT in 1 yr.   Repeat CT Chest has been scheduled multiple times and cancelled by pt:  Dates ar 03/14/14, 04/01/14, 06/04/14, and 07/03/14. Given 07/03/14 appt was also cancelled, spoke with Dr. Joya Gaskins regarding above again.  Per Dr. Joya Gaskins:  Call pt - last CT Chest there was a 6 mm nodule.  Dr. Joya Gaskins highly reccomends and encourages pt to follow through with CT Chest.  When calling pt to discuss, see if there are barriers to him cancelling appt. Called, spoke with pt - Educated pt on above per Dr. Joya Gaskins.  He verbalized understanding and states he would like to follow through with having the CT Chest.  Pt reports he's needed to cancel this because he's had vehicle issues and wasn't able to make it to appts.  Pt is requesting to have the CT Chest rescheduled in Novemeber in the afternoon.  Attempted to see if we can schedule this sooner - pt states Nov is the soonest he can schedule right now.  Explained to pt the importance of this follow up in a timely manner and encouraged to pt schedule this scan prior to Nov. However, pt declined at this time to schedule any sooner than Nov 2015.  Advised would have PCCs schedule.  They would call him back with a new appt and encouraged pt to call office back if he there are any changes and he would like to have scan sooner.   PCCs, will you please work on schedule CT Chest for Nov 2015.  Pt prefers afternoon appt.  Thank you.

## 2014-07-12 NOTE — Telephone Encounter (Signed)
i am aware of this change

## 2014-07-12 NOTE — Telephone Encounter (Signed)
Called patient and R/S CT Chest to Wed. Nov 4 at 3:00 at [facility] at [address] 3rd floor. No prep. Pt was advised to please try and keep this appointment. That this f/u CT Chest is very important. Pt was mailed appointment information date, time and location of the CT. No prep. Pt voiced understanding and stated that he would keep this appointment. Order given to [facility] to obtain pre cert in late October. Nothing else needed at this time. Rhonda J Cobb

## 2014-08-28 ENCOUNTER — Other Ambulatory Visit: Payer: Medicare Other

## 2014-08-30 ENCOUNTER — Ambulatory Visit (INDEPENDENT_AMBULATORY_CARE_PROVIDER_SITE_OTHER)
Admission: RE | Admit: 2014-08-30 | Discharge: 2014-08-30 | Disposition: A | Discharge status: Home / Self Care | Payer: Medicare Other | Source: Ambulatory Visit | Attending: Critical Care Medicine | Admitting: Critical Care Medicine

## 2014-08-30 DIAGNOSIS — R911 Solitary pulmonary nodule: Secondary | ICD-10-CM

## 2016-07-03 IMAGING — CT CT CHEST W/O CM
2 of 4 series · 15 of 36 positions shown, 18 images · IV contrast (Omnipaque 300)
Comparison: 03/02/2013 and 10/02/2012

CLINICAL DATA: Followup indeterminate right middle lobe nodule.

EXAM:
CT CHEST WITHOUT CONTRAST
TECHNIQUE: Multidetector CT imaging of the chest was performed following the
standard protocol without IV contrast.

[Series 2: chest routine with · axial · 0.60mm/px · z∈[-275,-45]mm · 12 of 56 slices shown, 15 images]
[im 5/56  mediastinal]
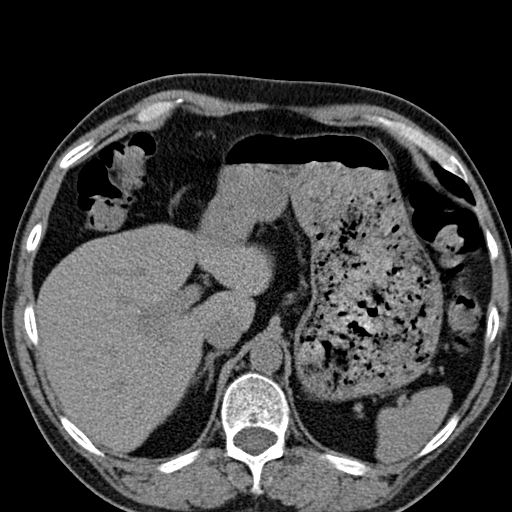
[im 5/56  lung]
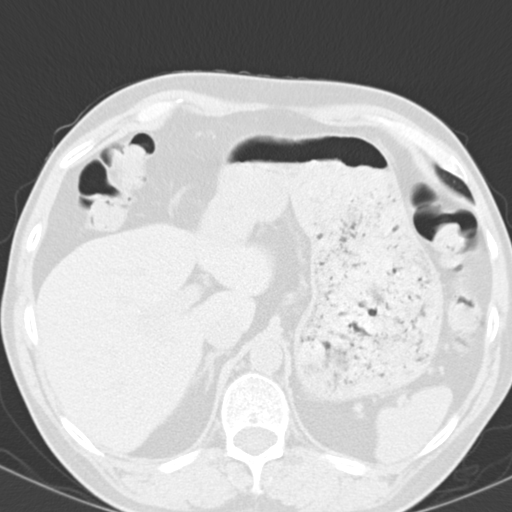
[im 9/56  lung]
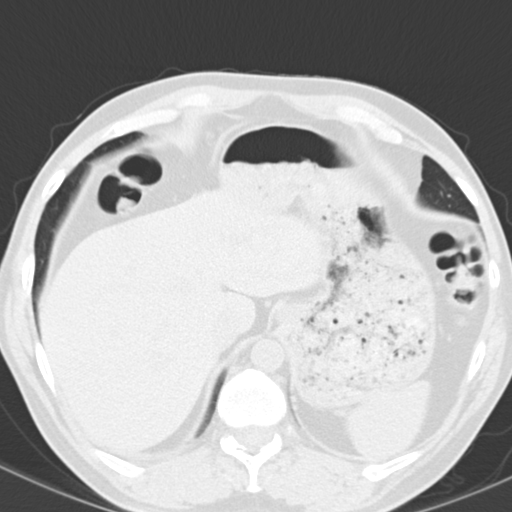
[im 13/56  lung]
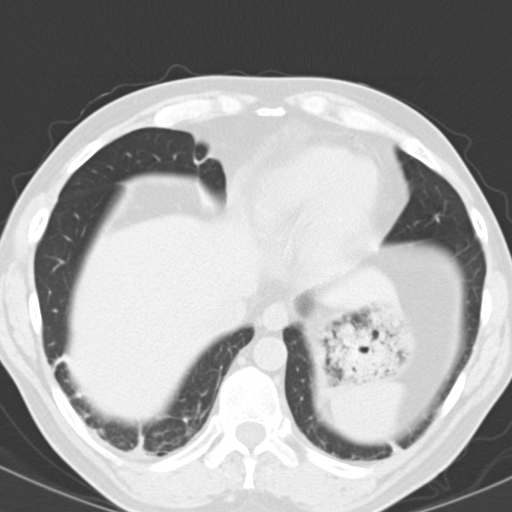
[im 17/56  lung]
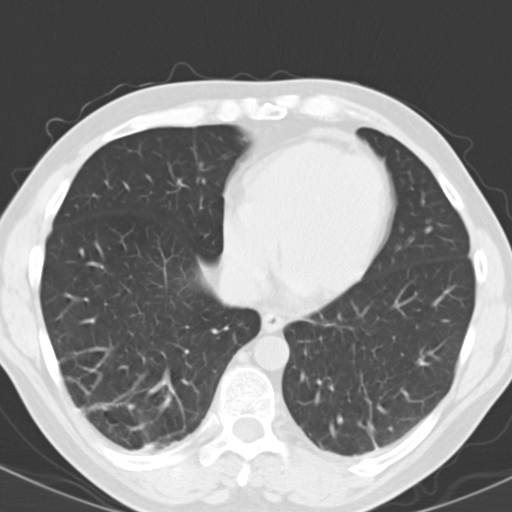
[im 22/56  mediastinal]
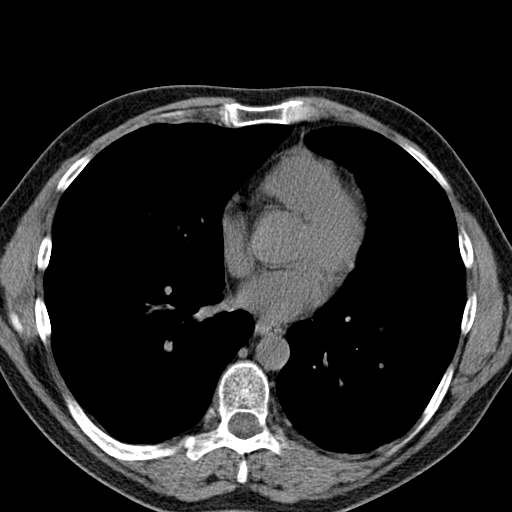
[im 22/56  lung]
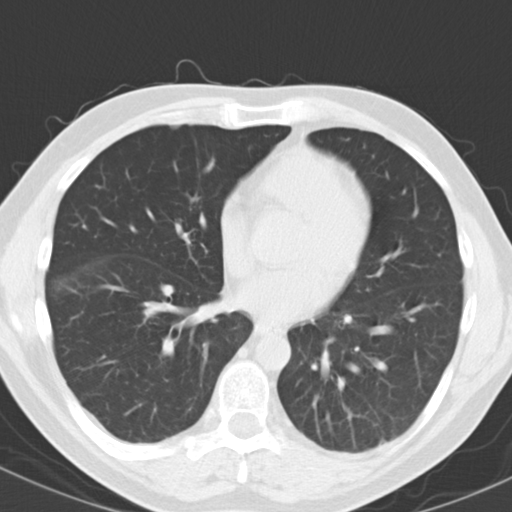
[im 26/56  lung]
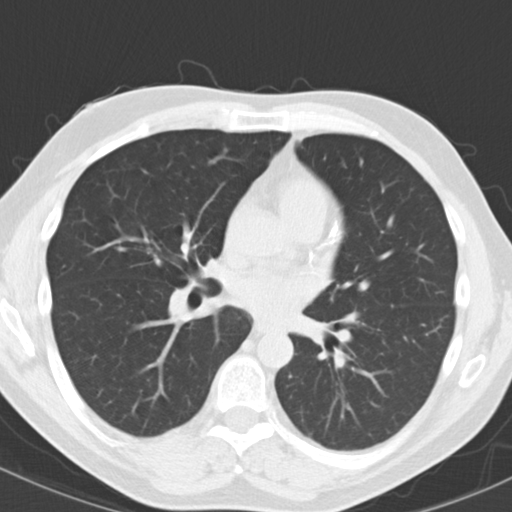
[im 30/56  lung]
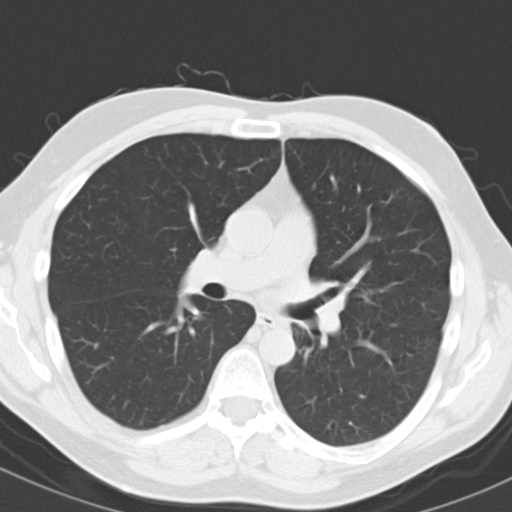
[im 34/56  lung]
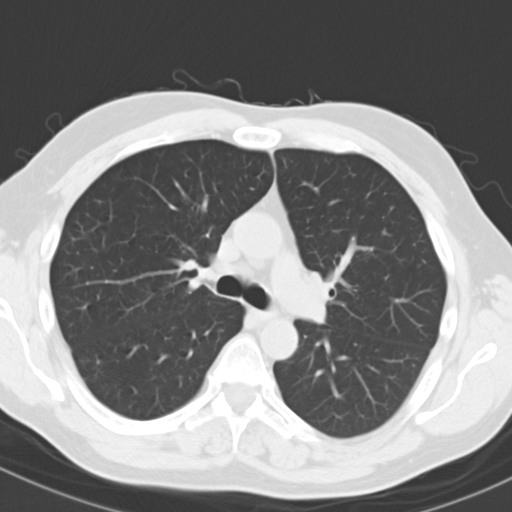
[im 39/56  mediastinal]
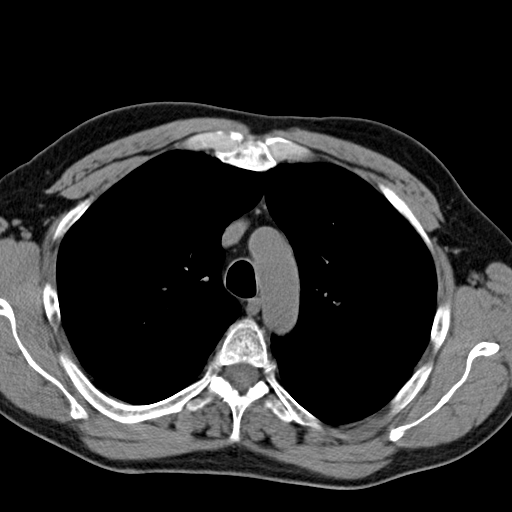
[im 39/56  lung]
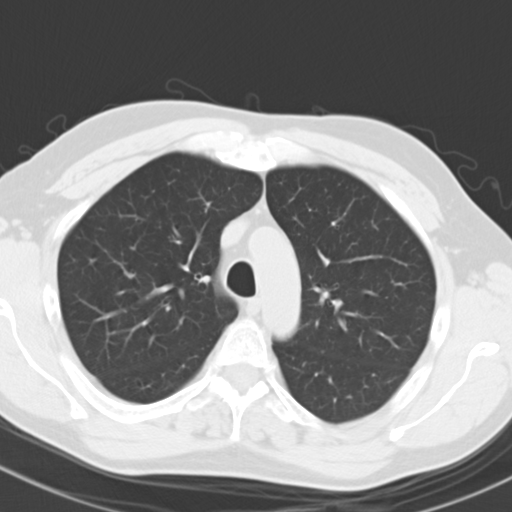
[im 43/56  lung]
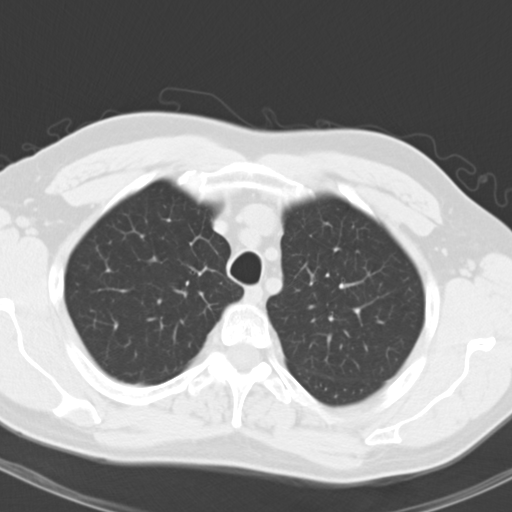
[im 47/56  lung]
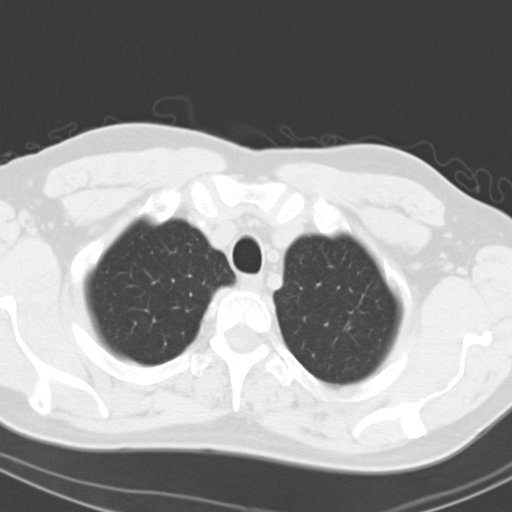
[im 51/56  lung]
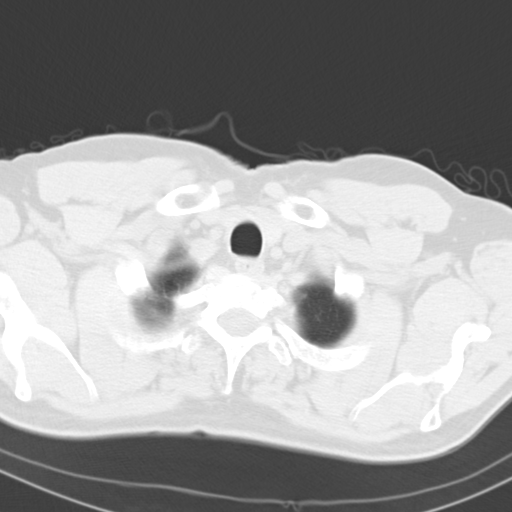

[Series 602: coronals · coronal · 0.63mm/px · 3 of 120 slices shown]
[im 24/120  lung]
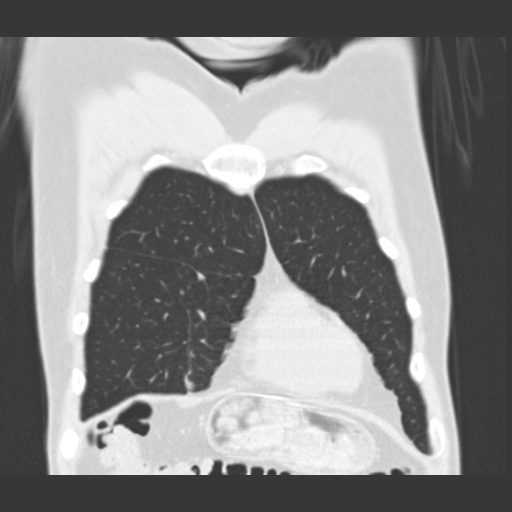
[im 48/120  lung]
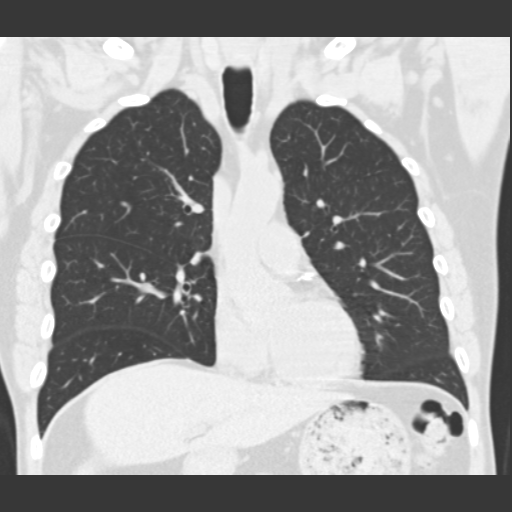
[im 72/120  lung]
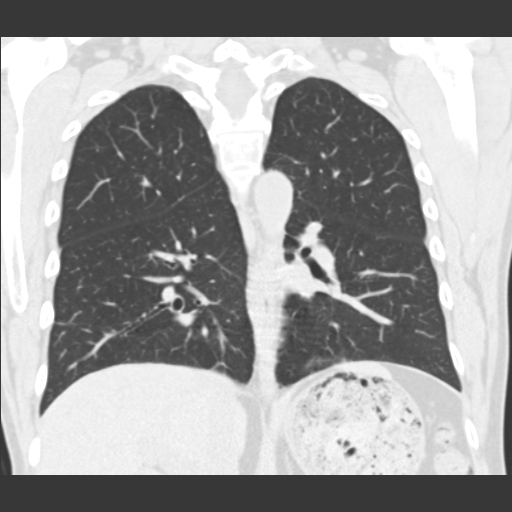

[15 of 36 positions shown; findings below may reference images not displayed]

FINDINGS: Mediastinum/Hilar Regions: No masses or pathologically enlarged
lymph nodes identified.

Other Thoracic Lymphadenopathy:  None.

Lungs: Tiny 6 mm subpleural nodule in the anterior aspect of the
right middle lobe on image 35 is stable. No new or enlarging
pulmonary nodules or masses are identified. Bibasilar scarring
remains stable. No evidence of pulmonary infiltrate.

Pleura:  No evidence of effusion or mass.

Vascular/Cardiac:  No acute findings identified.

Other:  None.

Musculoskeletal:  No suspicious bone lesions identified.
IMPRESSION: Stable 6 mm subpleural nodule in the anterior right middle lobe,
consistent with benign etiology.

Stable bibasilar scarring. No active disease or acute findings
within the thorax.

## 2017-03-01 DIAGNOSIS — E785 Hyperlipidemia, unspecified: Secondary | ICD-10-CM | POA: Diagnosis not present

## 2017-03-01 DIAGNOSIS — M545 Low back pain: Secondary | ICD-10-CM | POA: Diagnosis not present

## 2017-06-06 DIAGNOSIS — Z125 Encounter for screening for malignant neoplasm of prostate: Secondary | ICD-10-CM | POA: Diagnosis not present

## 2017-06-06 DIAGNOSIS — M545 Low back pain: Secondary | ICD-10-CM | POA: Diagnosis not present

## 2017-09-05 DIAGNOSIS — M545 Low back pain: Secondary | ICD-10-CM | POA: Diagnosis not present

## 2017-09-05 DIAGNOSIS — Z23 Encounter for immunization: Secondary | ICD-10-CM | POA: Diagnosis not present

## 2017-09-05 DIAGNOSIS — R220 Localized swelling, mass and lump, head: Secondary | ICD-10-CM | POA: Diagnosis not present

## 2017-09-05 DIAGNOSIS — E785 Hyperlipidemia, unspecified: Secondary | ICD-10-CM | POA: Diagnosis not present

## 2017-11-10 DIAGNOSIS — M545 Low back pain: Secondary | ICD-10-CM | POA: Diagnosis not present

## 2017-11-10 DIAGNOSIS — R5382 Chronic fatigue, unspecified: Secondary | ICD-10-CM | POA: Diagnosis not present

## 2017-11-10 DIAGNOSIS — G8929 Other chronic pain: Secondary | ICD-10-CM | POA: Diagnosis not present

## 2017-12-05 DIAGNOSIS — M545 Low back pain: Secondary | ICD-10-CM | POA: Diagnosis not present

## 2017-12-05 DIAGNOSIS — E785 Hyperlipidemia, unspecified: Secondary | ICD-10-CM | POA: Diagnosis not present

## 2018-03-13 DIAGNOSIS — M545 Low back pain: Secondary | ICD-10-CM | POA: Diagnosis not present

## 2018-03-13 DIAGNOSIS — M542 Cervicalgia: Secondary | ICD-10-CM | POA: Diagnosis not present

## 2018-03-13 DIAGNOSIS — E785 Hyperlipidemia, unspecified: Secondary | ICD-10-CM | POA: Diagnosis not present

## 2018-03-13 DIAGNOSIS — E782 Mixed hyperlipidemia: Secondary | ICD-10-CM | POA: Diagnosis not present

## 2018-03-13 DIAGNOSIS — R079 Chest pain, unspecified: Secondary | ICD-10-CM | POA: Diagnosis not present

## 2018-07-17 DIAGNOSIS — Z6824 Body mass index (BMI) 24.0-24.9, adult: Secondary | ICD-10-CM | POA: Diagnosis not present

## 2018-07-17 DIAGNOSIS — E785 Hyperlipidemia, unspecified: Secondary | ICD-10-CM | POA: Diagnosis not present

## 2018-10-16 DIAGNOSIS — Z6824 Body mass index (BMI) 24.0-24.9, adult: Secondary | ICD-10-CM | POA: Diagnosis not present

## 2018-10-16 DIAGNOSIS — E785 Hyperlipidemia, unspecified: Secondary | ICD-10-CM | POA: Diagnosis not present

## 2018-10-16 DIAGNOSIS — R799 Abnormal finding of blood chemistry, unspecified: Secondary | ICD-10-CM | POA: Diagnosis not present

## 2018-10-16 DIAGNOSIS — R221 Localized swelling, mass and lump, neck: Secondary | ICD-10-CM | POA: Diagnosis not present

## 2019-01-15 DIAGNOSIS — M545 Low back pain: Secondary | ICD-10-CM | POA: Diagnosis not present

## 2019-01-15 DIAGNOSIS — E785 Hyperlipidemia, unspecified: Secondary | ICD-10-CM | POA: Diagnosis not present

## 2019-01-15 DIAGNOSIS — Z0000 Encounter for general adult medical examination without abnormal findings: Secondary | ICD-10-CM | POA: Diagnosis not present

## 2019-04-17 DIAGNOSIS — Z6824 Body mass index (BMI) 24.0-24.9, adult: Secondary | ICD-10-CM | POA: Diagnosis not present

## 2019-04-17 DIAGNOSIS — E785 Hyperlipidemia, unspecified: Secondary | ICD-10-CM | POA: Diagnosis not present

## 2019-04-17 DIAGNOSIS — Z7189 Other specified counseling: Secondary | ICD-10-CM | POA: Diagnosis not present

## 2019-04-17 DIAGNOSIS — M545 Low back pain: Secondary | ICD-10-CM | POA: Diagnosis not present

## 2019-06-12 ENCOUNTER — Other Ambulatory Visit: Payer: Self-pay

## 2019-06-12 ENCOUNTER — Emergency Department (HOSPITAL_COMMUNITY)
Admission: EM | Admit: 2019-06-12 | Discharge: 2019-06-12 | Discharge status: Against Medical Advice | Payer: Medicare Other | Attending: Emergency Medicine | Admitting: Emergency Medicine

## 2019-06-12 ENCOUNTER — Encounter (HOSPITAL_COMMUNITY): Payer: Self-pay | Admitting: Emergency Medicine

## 2019-06-12 DIAGNOSIS — Z5321 Procedure and treatment not carried out due to patient leaving prior to being seen by health care provider: Secondary | ICD-10-CM | POA: Diagnosis not present

## 2019-06-12 DIAGNOSIS — R000 Tachycardia, unspecified: Secondary | ICD-10-CM | POA: Diagnosis not present

## 2019-06-12 DIAGNOSIS — R079 Chest pain, unspecified: Secondary | ICD-10-CM | POA: Diagnosis not present

## 2019-06-12 DIAGNOSIS — R0789 Other chest pain: Secondary | ICD-10-CM | POA: Diagnosis present

## 2019-06-12 MED ORDER — SODIUM CHLORIDE 0.9% FLUSH: 3.0000 mL | Freq: Once | INTRAVENOUS | Status: DC

## 2019-06-12 NOTE — ED Notes (Signed)
Patient sister brenda asking for an update  [phone] Please call when we can

## 2019-06-12 NOTE — ED Triage Notes (Signed)
Pt brought to ED by [facility] from home for c/o mid cp for the past 3 days, pt denies any SOB, nausea or vomiting. Pt states he thinks that he is positive for COVID, states he doesn't use his face mask every day. No close family know with COVID- 19. 324 mg ASA given by EMS pta to ed, SR to ST on the monitor still 4/10 CP. BP 142/88, HR 87, R-14, SPO2 99% RA 98 Tempeture.

## 2019-07-17 DIAGNOSIS — R079 Chest pain, unspecified: Secondary | ICD-10-CM | POA: Diagnosis not present

## 2019-09-06 ENCOUNTER — Ambulatory Visit: Payer: Self-pay | Admitting: Cardiology

## 2019-09-11 DIAGNOSIS — E785 Hyperlipidemia, unspecified: Secondary | ICD-10-CM | POA: Diagnosis not present

## 2019-09-11 DIAGNOSIS — R079 Chest pain, unspecified: Secondary | ICD-10-CM | POA: Diagnosis not present

## 2019-11-16 ENCOUNTER — Ambulatory Visit (INDEPENDENT_AMBULATORY_CARE_PROVIDER_SITE_OTHER): Payer: Medicare Other | Admitting: Cardiology

## 2019-11-16 ENCOUNTER — Other Ambulatory Visit: Payer: Self-pay

## 2019-11-16 ENCOUNTER — Encounter: Payer: Self-pay | Admitting: Cardiology

## 2019-11-16 VITALS — BP 141/94 | HR 81 | Temp 99.3°F | Ht 67.0 in | Wt 142.4 lb

## 2019-11-16 DIAGNOSIS — I209 Angina pectoris, unspecified: Secondary | ICD-10-CM | POA: Diagnosis not present

## 2019-11-16 DIAGNOSIS — I10 Essential (primary) hypertension: Secondary | ICD-10-CM

## 2019-11-16 DIAGNOSIS — R0609 Other forms of dyspnea: Secondary | ICD-10-CM

## 2019-11-16 DIAGNOSIS — E7800 Pure hypercholesterolemia, unspecified: Secondary | ICD-10-CM | POA: Diagnosis not present

## 2019-11-16 DIAGNOSIS — Z8249 Family history of ischemic heart disease and other diseases of the circulatory system: Secondary | ICD-10-CM | POA: Diagnosis not present

## 2019-11-16 MED ORDER — METOPROLOL SUCCINATE ER 50 MG PO TB24: 50.0000 mg | ORAL_TABLET | Freq: Every day | ORAL | 2 refills | Status: DC

## 2019-11-16 MED ORDER — NITROGLYCERIN 0.4 MG SL SUBL: 0.4000 mg | SUBLINGUAL_TABLET | SUBLINGUAL | 3 refills | Status: DC | PRN

## 2019-11-16 NOTE — Progress Notes (Signed)
Primary Physician/Referring:  Iona Beard, MD  Patient ID: Cody Preston, male    DOB: 1966-01-22, 54 y.o.   MRN: [id number]  Chief Complaint  Patient presents with  . Chest Pain  . Hyperlipidemia  . Coronary Artery Disease  . New Patient (Initial Visit)   HPI:    Cody Preston  is a 54 y.o. 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  He'd been doing well and now presents with chest pain that started about 6 months ago, described as chest tightness with radiation to his neck and jaw, comes on with physical exertion activity and is relieved with rest.  Recently has been getting worse and is associated with shortness of breath and also palpitations.  Past Medical History:  Diagnosis Date  . Back pain   . Hypercholesteremia    Past Surgical History:  Procedure Laterality Date  . BACK SURGERY    . BACK SURGERY    . CLUB FOOT RELEASE    . SHUNT REPLACEMENT     at 69 months of age   Social History   Tobacco Use  . Smoking status: Never Smoker  . Smokeless tobacco: Never Used  Substance Use Topics  . Alcohol use: Yes   ROS  Review of Systems  Constitution: Negative for weight gain.  Cardiovascular: Positive for chest pain, dyspnea on exertion and palpitations. Negative for leg swelling and syncope.  Respiratory: Negative for hemoptysis.   Endocrine: Negative for cold intolerance.  Hematologic/Lymphatic: Does not bruise/bleed easily.  Musculoskeletal: Positive for back pain and joint pain.  Gastrointestinal: Negative for hematochezia and melena.  Neurological: Negative for headaches and light-headedness.   Objective  Blood pressure (!) 141/94, pulse 81,  temperature 99.3 F (37.4 C), height 5\' 7"  (1.702 m), weight 142 lb 6.4 oz (64.6 kg), SpO2 98 %.  Vitals with BMI 11/16/2019 06/12/2019 02/23/2013  Height 5\' 7"  - 5\' 7"   Weight 142 lbs 6 oz - 149 lbs  BMI 99991111 - 0000000  Systolic Q000111Q 123456 0000000  Diastolic 94 A999333 90  Pulse 81 108 94     Physical Exam  Neck: No thyromegaly present.  Cardiovascular: Normal rate, regular rhythm, normal heart sounds and intact distal pulses. Exam reveals no gallop.  No murmur heard. No leg edema, no JVD.  Pulmonary/Chest: Effort normal and breath sounds normal.  Abdominal: Soft. Bowel sounds are normal.  Musculoskeletal:     Cervical back: Neck supple.  Skin: Skin is warm and dry.   Laboratory examination:   No results for input(s): NA, K, CL, CO2, GLUCOSE, BUN, CREATININE, CALCIUM, GFRNONAA, GFRAA in the last 8760 hours. CrCl cannot be calculated (Patient's most recent lab result is older than the maximum 21 days allowed.).  CMP Latest Ref Rng & Units 10/02/2012 10/02/2012  Glucose 70 - 99 mg/dL 124(H) 122(H)  BUN 6 - 23 mg/dL 11 11  Creatinine 0.50 - 1.35 mg/dL 0.80 0.85  Sodium 135 - 145 mEq/L 134(L) 132(L)  Potassium 3.5 - 5.1 mEq/L 3.3(L) 3.2(L)  Chloride 96 - 112 mEq/L 97 96  CO2 19 - 32 mEq/L - 27  Calcium 8.4 - 10.5 mg/dL - 9.1   CBC Latest Ref Rng &  Units 10/02/2012 10/02/2012  WBC 4.0 - 10.5 K/uL - 6.1  Hemoglobin 13.0 - 17.0 g/dL 13.3 12.3(L)  Hematocrit 39.0 - 52.0 % 39.0 37.3(L)  Platelets 150 - 400 K/uL - 438(H)   Lipid Panel  No results found for: CHOL, TRIG, HDL, CHOLHDL, VLDL, LDLCALC, LDLDIRECT HEMOGLOBIN A1C No results found for: HGBA1C, MPG TSH No results for input(s): TSH in the last 8760 hours.  Outside labs: 04/17/2019 Cholesterol, total 162.000 m  HDL 71.000 mg 04/17/2019 LDL 77.000 mg 04/17/2019 Triglycerides 63.000 mg 04/17/2019  Medications and allergies  No Known Allergies   Current Outpatient Medications  Medication Instructions  . Acetaminophen (ARTHRITIS PAIN RELIEF  PO) As needed  . aspirin EC 81 mg, Daily  . metoprolol succinate (TOPROL-XL) 50 mg, Oral, Daily, Take with or immediately following a meal.  . nitroGLYCERIN (NITROSTAT) 0.4 mg, Sublingual, Every 5 min PRN  . simvastatin (ZOCOR) 20 mg, Oral, Daily at bedtime  . traMADol (ULTRAM) 50 mg, Every 8 hours PRN    Radiology:  No results found.  Cardiac Studies:   None  Assessment     ICD-10-CM   1. Angina pectoris (HCC)  I20.9 EKG 12-Lead    metoprolol succinate (TOPROL-XL) 50 MG 24 hr tablet    nitroGLYCERIN (NITROSTAT) 0.4 MG SL tablet    PCV MYOCARDIAL PERFUSION WITH LEXISCAN    PCV ECHOCARDIOGRAM COMPLETE  2. Dyspnea on exertion  R06.00 PCV ECHOCARDIOGRAM COMPLETE  3. Primary hypertension  I10 PCV ECHOCARDIOGRAM COMPLETE  4. Family history of premature CAD. Mother and brother MI at 55Y and 61 Y  Z66.49   5. Hypercholesteremia  E78.00     EKG 11/15/2018: Normal sinus rhythm with rate of 94 bpm, normal axis.  No evidence of ischemia, normal EKG.    Meds ordered this encounter  Medications  . metoprolol succinate (TOPROL-XL) 50 MG 24 hr tablet    Sig: Take 1 tablet (50 mg total) by mouth daily. Take with or immediately following a meal.    Dispense:  30 tablet    Refill:  2  . nitroGLYCERIN (NITROSTAT) 0.4 MG SL tablet    Sig: Place 1 tablet (0.4 mg total) under the tongue every 5 (five) minutes as needed for up to 25 days for chest pain.    Dispense:  25 tablet    Refill:  3    There are no discontinued medications.   Recommendations:   [name]  is a 54 y.o. 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  He now presents with chest pain  suggestive of angina pectoris with radiation to his jaw started 6 months ago.  Symptoms are very classic, I'll start him on metoprolol succinate 50 mg p.o. q. Daily, blood pressure is also elevated, I rechecked his blood pressure, no change.  I suspect he probably has mild hypertension. S/L NTG was prescribed and explained how to and when to use it and to notify us if there is change in frequency of use. Interaction with cialis-like agents (if applicable was discussed). He will also continue with aspirin for prophylaxis for now. I reviewed his external labs (being uploaded), lipids are controlled and normal renal function and CBC.  Will schedule for an echocardiogram. Schedule for a Lexiscan Sestamibi stress test to evaluate for myocardial ischemia. Patient unable to do treadmill stress testing due to back pain. Office visit following the work-up/investigations.   [name], MD, [facility] 11/16/2019, 9:30 AM [facility] [place] Office: [phone]

## 2019-11-19 ENCOUNTER — Ambulatory Visit (INDEPENDENT_AMBULATORY_CARE_PROVIDER_SITE_OTHER): Payer: Medicare Other

## 2019-11-19 ENCOUNTER — Other Ambulatory Visit: Payer: Self-pay

## 2019-11-19 DIAGNOSIS — I209 Angina pectoris, unspecified: Secondary | ICD-10-CM

## 2019-11-19 DIAGNOSIS — R0602 Shortness of breath: Secondary | ICD-10-CM

## 2019-11-19 DIAGNOSIS — R9431 Abnormal electrocardiogram [ECG] [EKG]: Secondary | ICD-10-CM | POA: Diagnosis not present

## 2019-11-19 DIAGNOSIS — R0789 Other chest pain: Secondary | ICD-10-CM | POA: Diagnosis not present

## 2019-11-20 ENCOUNTER — Ambulatory Visit (INDEPENDENT_AMBULATORY_CARE_PROVIDER_SITE_OTHER): Payer: Medicare Other

## 2019-11-20 DIAGNOSIS — R0609 Other forms of dyspnea: Secondary | ICD-10-CM | POA: Diagnosis not present

## 2019-11-20 DIAGNOSIS — I10 Essential (primary) hypertension: Secondary | ICD-10-CM

## 2019-11-20 DIAGNOSIS — I209 Angina pectoris, unspecified: Secondary | ICD-10-CM

## 2019-11-20 NOTE — Progress Notes (Signed)
Pt aware.

## 2019-11-20 NOTE — Progress Notes (Signed)
High risk stress.  Please prepone his echo and OV with me to earlier dates, but not necessarily stat.

## 2019-11-20 NOTE — Progress Notes (Signed)
Can you get the dates and ill call him; if im not here then another MA can do it

## 2019-11-22 ENCOUNTER — Ambulatory Visit: Payer: Medicare Other | Admitting: Cardiology

## 2019-11-28 ENCOUNTER — Ambulatory Visit: Payer: Medicare Other | Admitting: Cardiology

## 2019-11-30 NOTE — Progress Notes (Signed)
Spoke with patient and discussed echo results. Patient said that he had an appt scheduled on Tues, but he had already canceled that appt., I transferred the call to [facility] to reschedule that appt.

## 2019-12-04 ENCOUNTER — Encounter: Payer: Self-pay | Admitting: Cardiology

## 2019-12-04 ENCOUNTER — Ambulatory Visit (INDEPENDENT_AMBULATORY_CARE_PROVIDER_SITE_OTHER): Payer: Medicare Other | Admitting: Cardiology

## 2019-12-04 ENCOUNTER — Other Ambulatory Visit: Payer: Self-pay

## 2019-12-04 VITALS — BP 130/92 | HR 75 | Temp 98.4°F | Resp 17 | Wt 144.5 lb

## 2019-12-04 DIAGNOSIS — I209 Angina pectoris, unspecified: Secondary | ICD-10-CM

## 2019-12-04 DIAGNOSIS — R9439 Abnormal result of other cardiovascular function study: Secondary | ICD-10-CM

## 2019-12-04 DIAGNOSIS — E7800 Pure hypercholesterolemia, unspecified: Secondary | ICD-10-CM

## 2019-12-04 DIAGNOSIS — I10 Essential (primary) hypertension: Secondary | ICD-10-CM

## 2019-12-04 MED ORDER — ROSUVASTATIN CALCIUM 20 MG PO TABS: 20.0000 mg | ORAL_TABLET | Freq: Every day | ORAL | 1 refills | Status: DC

## 2019-12-04 MED ORDER — AMLODIPINE BESYLATE 5 MG PO TABS: 5.0000 mg | ORAL_TABLET | Freq: Every day | ORAL | 2 refills | Status: DC

## 2019-12-04 NOTE — Progress Notes (Signed)
Primary Physician/Referring:  Iona Beard, MD  Patient ID: Cody Preston, male    DOB: 1966-06-17, 54 y.o.   MRN: [id number]  Chief Complaint  Patient presents with  . Hyperlipidemia  . Coronary Artery Disease  . Follow-up  . Results    echo   HPI:    Cody Preston  is a 54 y.o. 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 Recently evaluated for symptoms of angina. Underwent echocardiogram and nuclear stress test and now presents for follow up.  Patient continues to have symptoms of exertional chest discomfort and dyspnea that resolves with resting.  Nitroglycerin does seem to help his chest pain.  Sisters present at bedside.  Past Medical History:  Diagnosis Date  . Back pain   . Hypercholesteremia    Past Surgical History:  Procedure Laterality Date  . BACK SURGERY    . BACK SURGERY    . CLUB FOOT RELEASE    . SHUNT REPLACEMENT     at 35 months of age   Social History   Tobacco Use  . Smoking status: Never Smoker  . Smokeless tobacco: Never Used  Substance Use Topics  . Alcohol use: Not Currently   ROS  Review of Systems  Constitution: Negative for weight gain.  Cardiovascular: Positive for chest pain, dyspnea on exertion and palpitations. Negative for leg swelling and syncope.  Respiratory: Negative for hemoptysis.   Endocrine: Negative for cold intolerance.  Hematologic/Lymphatic: Does not bruise/bleed easily.  Musculoskeletal: Positive for back pain and joint pain.  Gastrointestinal: Negative for hematochezia and melena.  Neurological: Negative for headaches and light-headedness.   Objective  Blood pressure (!) 130/92, pulse 75, temperature 98.4 F  (36.9 C), resp. rate 17, weight 144 lb 8 oz (65.5 kg), SpO2 97 %.  Vitals with BMI 12/04/2019 12/04/2019 11/16/2019  Height - - 5\' 7"   Weight - 144 lbs 8 oz 142 lbs 6 oz  BMI - [id number]  Systolic [id number]  Diastolic 92 [id number] 94  Pulse - 75 81     Physical Exam  Neck: No thyromegaly present.  Cardiovascular: Normal rate, regular rhythm, normal heart sounds and intact distal pulses. Exam reveals no gallop.  No murmur heard. No leg edema, no JVD.  Pulmonary/Chest: Effort normal and breath sounds normal.  Abdominal: Soft. Bowel sounds are normal.  Musculoskeletal:     Cervical back: Neck supple.  Skin: Skin is warm and dry.   Laboratory examination:   No results for input(s): NA, K, CL, CO2, GLUCOSE, BUN, CREATININE, CALCIUM, GFRNONAA, GFRAA in the last 8760 hours. CrCl cannot be calculated (Patient's most recent lab result is older than the maximum 21 days allowed.).  CMP Latest Ref Rng & Units 10/02/2012 10/02/2012  Glucose 70 - 99 mg/dL 124(H) 122(H)  BUN 6 - 23 mg/dL 11 11  Creatinine 0.50 - 1.35 mg/dL 0.80 0.85  Sodium 135 - 145 mEq/L 134(L) 132(L)  Potassium 3.5 - 5.1 mEq/L 3.3(L) 3.2(L)  Chloride 96 - 112 mEq/L 97 96  CO2 19 - 32 mEq/L - 27  Calcium 8.4 - 10.5 mg/dL - 9.1   CBC Latest Ref Rng & Units 10/02/2012 10/02/2012  WBC 4.0 - 10.5 K/uL -  6.1  Hemoglobin 13.0 - 17.0 g/dL 13.3 12.3(L)  Hematocrit 39.0 - 52.0 % 39.0 37.3(L)  Platelets 150 - 400 K/uL - 438(H)   Lipid Panel  No results found for: CHOL, TRIG, HDL, CHOLHDL, VLDL, LDLCALC, LDLDIRECT HEMOGLOBIN A1C No results found for: HGBA1C, MPG TSH No results for input(s): TSH in the last 8760 hours.  Outside labs: 04/17/2019 Cholesterol, total 162.000 m  HDL 71.000 mg 04/17/2019 LDL 77.000 mg 04/17/2019 Triglycerides 63.000 mg 04/17/2019  Medications and allergies  No Known Allergies   Current Outpatient Medications  Medication Instructions  . Acetaminophen (ARTHRITIS PAIN RELIEF PO) As needed  . amLODipine  (NORVASC) 5 mg, Oral, Daily  . aspirin EC 81 mg, Daily  . metoprolol succinate (TOPROL-XL) 50 mg, Oral, Daily, Take with or immediately following a meal.  . nitroGLYCERIN (NITROSTAT) 0.4 mg, Sublingual, Every 5 min PRN  . rosuvastatin (CRESTOR) 20 mg, Oral, Daily  . traMADol (ULTRAM) 50 mg, Every 8 hours PRN    Radiology:  No results found.  Cardiac Studies:   Lexiscan Tetrofosmin Stress Test  11/19/2019: Abnormal ECG stress. Intravenous Lexiscan protocol. Resting EKG/ECG demonstrated normal sinus rhythm. Peak EKG/ECG revealed 1 mm horizontal ST depression of the inferolateral leads. During infusion the patient developed no chest pain, and exhibited arm pain, dizziness, jaw pain, & headache. Large extent perfusion defect consistent with severe ischemia in the mid to distal anterior and anterolateral wall in LAD distribution.  Gated SPECT imaging of the left ventricle demonstrating akinesis in the same region.  Stress LVEF moderately depressed at 37%.  No previous exam available for comparison. High risk study.   Echocardiogram 11/22/2019:  Left ventricle cavity is normal in size and function. Normal global wall  motion. Calculated EF 47%. Visual LVEF 55-60%  Left atrial cavity is mildly dilated by volume.  Structurally normal tricuspid valve. Mild tricuspid regurgitation. No  evidence of pulmonary hypertension.  Assessment     ICD-10-CM   1. Abnormal nuclear stress test  R94.39   2. Angina pectoris (HCC)  I20.9   3. Primary hypertension  I10   4. Hypercholesteremia  E78.00     EKG 11/15/2018: Normal sinus rhythm with rate of 94 bpm, normal axis.  No evidence of ischemia, normal EKG.    Meds ordered this encounter  Medications  . rosuvastatin (CRESTOR) 20 MG tablet    Sig: Take 1 tablet (20 mg total) by mouth daily.    Dispense:  30 tablet    Refill:  1    Order Specific Question:   Supervising Provider    Answer:   [name] [[id number]]  . amLODipine (NORVASC) 5 MG tablet     Sig: Take 1 tablet (5 mg total) by mouth daily.    Dispense:  30 tablet    Refill:  2    Order Specific Question:   Supervising Provider    Answer:   [name] [[id number]]    Medications Discontinued During This Encounter  Medication Reason  . simvastatin (ZOCOR) 20 MG tablet Discontinued by provider     Recommendations:   Cody Preston  is a 54 y.o. 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   I have  reviewed and discussed his recent echocardiogram and nuclear stress test results with the patient and his sister. Given his abnormal stress test findings and his persistent symptoms of angina, feel that we should pursue coronary angiogram for further evaluation. Patient and his sister agree. Schedule for cardiac catheterization, and possible angioplasty. We discussed regarding risks, benefits, alternatives to this including stress testing, CTA and continued medical therapy. Patient wants to proceed. Understands <1-2% risk of death, stroke, MI, urgent CABG, bleeding, infection, renal failure but not limited to these.  His blood pressure continues to be elevated, will add amlodipine 5 mg daily both for angina and hypertension. Continue with other present medications. Encouraged him to avoid any strenuous activities until he can have further evaluation.   He is on Simvastatin for hyperlipidemia, in view of abnormal nuclear stress test, will change to Crestor for improved LDL reduction. Will need reevaluation in the next 8 weeks. Will see him back after the procedure for follow up.    Miquel Dunn, MSN, APRN, FNP-C [facility]. [place] Office:  [phone] Fax: [phone]

## 2019-12-04 NOTE — H&P (View-Only) (Signed)
Primary Physician/Referring:  Iona Beard, MD  Patient ID: Cody Preston, male    DOB: 1966/02/21, 54 y.o.   MRN: [id number]  Chief Complaint  Patient presents with  . Hyperlipidemia  . Coronary Artery Disease  . Follow-up  . Results    echo   HPI:    Cody Preston  is a 54 y.o. 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 Recently evaluated for symptoms of angina. Underwent echocardiogram and nuclear stress test and now presents for follow up.  Patient continues to have symptoms of exertional chest discomfort and dyspnea that resolves with resting.  Nitroglycerin does seem to help his chest pain.  Sisters present at bedside.  Past Medical History:  Diagnosis Date  . Back pain   . Hypercholesteremia    Past Surgical History:  Procedure Laterality Date  . BACK SURGERY    . BACK SURGERY    . CLUB FOOT RELEASE    . SHUNT REPLACEMENT     at 43 months of age   Social History   Tobacco Use  . Smoking status: Never Smoker  . Smokeless tobacco: Never Used  Substance Use Topics  . Alcohol use: Not Currently   ROS  Review of Systems  Constitution: Negative for weight gain.  Cardiovascular: Positive for chest pain, dyspnea on exertion and palpitations. Negative for leg swelling and syncope.  Respiratory: Negative for hemoptysis.   Endocrine: Negative for cold intolerance.  Hematologic/Lymphatic: Does not bruise/bleed easily.  Musculoskeletal: Positive for back pain and joint pain.  Gastrointestinal: Negative for hematochezia and melena.  Neurological: Negative for headaches and light-headedness.   Objective  Blood pressure (!) 130/92, pulse 75, temperature 98.4 F  (36.9 C), resp. rate 17, weight 144 lb 8 oz (65.5 kg), SpO2 97 %.  Vitals with BMI 12/04/2019 12/04/2019 11/16/2019  Height - - 5\' 7"   Weight - 144 lbs 8 oz 142 lbs 6 oz  BMI - [id number]  Systolic [id number]  Diastolic 92 [id number] 94  Pulse - 75 81     Physical Exam  Neck: No thyromegaly present.  Cardiovascular: Normal rate, regular rhythm, normal heart sounds and intact distal pulses. Exam reveals no gallop.  No murmur heard. No leg edema, no JVD.  Pulmonary/Chest: Effort normal and breath sounds normal.  Abdominal: Soft. Bowel sounds are normal.  Musculoskeletal:     Cervical back: Neck supple.  Skin: Skin is warm and dry.   Laboratory examination:   No results for input(s): NA, K, CL, CO2, GLUCOSE, BUN, CREATININE, CALCIUM, GFRNONAA, GFRAA in the last 8760 hours. CrCl cannot be calculated (Patient's most recent lab result is older than the maximum 21 days allowed.).  CMP Latest Ref Rng & Units 10/02/2012 10/02/2012  Glucose 70 - 99 mg/dL 124(H) 122(H)  BUN 6 - 23 mg/dL 11 11  Creatinine 0.50 - 1.35 mg/dL 0.80 0.85  Sodium 135 - 145 mEq/L 134(L) 132(L)  Potassium 3.5 - 5.1 mEq/L 3.3(L) 3.2(L)  Chloride 96 - 112 mEq/L 97 96  CO2 19 - 32 mEq/L - 27  Calcium 8.4 - 10.5 mg/dL - 9.1   CBC Latest Ref Rng & Units 10/02/2012 10/02/2012  WBC 4.0 - 10.5 K/uL -  6.1  Hemoglobin 13.0 - 17.0 g/dL 13.3 12.3(L)  Hematocrit 39.0 - 52.0 % 39.0 37.3(L)  Platelets 150 - 400 K/uL - 438(H)   Lipid Panel  No results found for: CHOL, TRIG, HDL, CHOLHDL, VLDL, LDLCALC, LDLDIRECT HEMOGLOBIN A1C No results found for: HGBA1C, MPG TSH No results for input(s): TSH in the last 8760 hours.  Outside labs: 04/17/2019 Cholesterol, total 162.000 m  HDL 71.000 mg 04/17/2019 LDL 77.000 mg 04/17/2019 Triglycerides 63.000 mg 04/17/2019  Medications and allergies  No Known Allergies   Current Outpatient Medications  Medication Instructions  . Acetaminophen (ARTHRITIS PAIN RELIEF PO) As needed  . amLODipine  (NORVASC) 5 mg, Oral, Daily  . aspirin EC 81 mg, Daily  . metoprolol succinate (TOPROL-XL) 50 mg, Oral, Daily, Take with or immediately following a meal.  . nitroGLYCERIN (NITROSTAT) 0.4 mg, Sublingual, Every 5 min PRN  . rosuvastatin (CRESTOR) 20 mg, Oral, Daily  . traMADol (ULTRAM) 50 mg, Every 8 hours PRN    Radiology:  No results found.  Cardiac Studies:   Lexiscan Tetrofosmin Stress Test  11/19/2019: Abnormal ECG stress. Intravenous Lexiscan protocol. Resting EKG/ECG demonstrated normal sinus rhythm. Peak EKG/ECG revealed 1 mm horizontal ST depression of the inferolateral leads. During infusion the patient developed no chest pain, and exhibited arm pain, dizziness, jaw pain, & headache. Large extent perfusion defect consistent with severe ischemia in the mid to distal anterior and anterolateral wall in LAD distribution.  Gated SPECT imaging of the left ventricle demonstrating akinesis in the same region.  Stress LVEF moderately depressed at 37%.  No previous exam available for comparison. High risk study.   Echocardiogram 11/22/2019:  Left ventricle cavity is normal in size and function. Normal global wall  motion. Calculated EF 47%. Visual LVEF 55-60%  Left atrial cavity is mildly dilated by volume.  Structurally normal tricuspid valve. Mild tricuspid regurgitation. No  evidence of pulmonary hypertension.  Assessment     ICD-10-CM   1. Abnormal nuclear stress test  R94.39   2. Angina pectoris (HCC)  I20.9   3. Primary hypertension  I10   4. Hypercholesteremia  E78.00     EKG 11/15/2018: Normal sinus rhythm with rate of 94 bpm, normal axis.  No evidence of ischemia, normal EKG.    Meds ordered this encounter  Medications  . rosuvastatin (CRESTOR) 20 MG tablet    Sig: Take 1 tablet (20 mg total) by mouth daily.    Dispense:  30 tablet    Refill:  1    Order Specific Question:   Supervising Provider    Answer:   [name] [[id number]]  . amLODipine (NORVASC) 5 MG tablet     Sig: Take 1 tablet (5 mg total) by mouth daily.    Dispense:  30 tablet    Refill:  2    Order Specific Question:   Supervising Provider    Answer:   [name] [[id number]]    Medications Discontinued During This Encounter  Medication Reason  . simvastatin (ZOCOR) 20 MG tablet Discontinued by provider     Recommendations:   TREVIUS KEMPER  is a 54 y.o. 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   I have  reviewed and discussed his recent echocardiogram and nuclear stress test results with the patient and his sister. Given his abnormal stress test findings and his persistent symptoms of angina, feel that we should pursue coronary angiogram for further evaluation. Patient and his sister agree. Schedule for cardiac catheterization, and possible angioplasty. We discussed regarding risks, benefits, alternatives to this including stress testing, CTA and continued medical therapy. Patient wants to proceed. Understands <1-2% risk of death, stroke, MI, urgent CABG, bleeding, infection, renal failure but not limited to these.  His blood pressure continues to be elevated, will add amlodipine 5 mg daily both for angina and hypertension. Continue with other present medications. Encouraged him to avoid any strenuous activities until he can have further evaluation.   He is on Simvastatin for hyperlipidemia, in view of abnormal nuclear stress test, will change to Crestor for improved LDL reduction. Will need reevaluation in the next 8 weeks. Will see him back after the procedure for follow up.    Miquel Dunn, MSN, APRN, FNP-C [facility]. [place] Office:  [phone] Fax: [phone]

## 2019-12-06 ENCOUNTER — Encounter: Payer: Self-pay | Admitting: Cardiology

## 2019-12-14 ENCOUNTER — Other Ambulatory Visit (HOSPITAL_COMMUNITY)
Admission: RE | Admit: 2019-12-14 | Discharge: 2019-12-14 | Disposition: A | Discharge status: Home / Self Care | Payer: Medicare Other | Source: Ambulatory Visit | Attending: Cardiology | Admitting: Cardiology

## 2019-12-14 DIAGNOSIS — Z20822 Contact with and (suspected) exposure to covid-19: Secondary | ICD-10-CM | POA: Insufficient documentation

## 2019-12-14 DIAGNOSIS — R9439 Abnormal result of other cardiovascular function study: Secondary | ICD-10-CM | POA: Diagnosis not present

## 2019-12-14 DIAGNOSIS — Z01812 Encounter for preprocedural laboratory examination: Secondary | ICD-10-CM | POA: Diagnosis not present

## 2019-12-14 LAB — SARS CORONAVIRUS 2 (TAT 6-24 HRS): SARS Coronavirus 2: NEGATIVE

## 2019-12-15 LAB — CBC
Hematocrit: 40.2 % (ref 37.5–51.0)
Hemoglobin: 13.7 g/dL (ref 13.0–17.7)
MCH: 28.8 pg (ref 26.6–33.0)
MCHC: 34.1 g/dL (ref 31.5–35.7)
MCV: 85 fL (ref 79–97)
Platelets: 360 10*3/uL (ref 150–450)
RBC: 4.75 x10E6/uL (ref 4.14–5.80)
RDW: 13.5 % (ref 11.6–15.4)
WBC: 5 10*3/uL (ref 3.4–10.8)

## 2019-12-15 LAB — BASIC METABOLIC PANEL
BUN/Creatinine Ratio: 16 (ref 9–20)
BUN: 14 mg/dL (ref 6–24)
CO2: 24 mmol/L (ref 20–29)
Calcium: 9.4 mg/dL (ref 8.7–10.2)
Chloride: 95 mmol/L — ABNORMAL LOW (ref 96–106)
Creatinine, Ser: 0.86 mg/dL (ref 0.76–1.27)
GFR calc Af Amer: 115 mL/min/{1.73_m2} (ref >59)
GFR calc non Af Amer: 100 mL/min/{1.73_m2} (ref >59)
Glucose: 92 mg/dL (ref 65–99)
Potassium: 4.6 mmol/L (ref 3.5–5.2)
Sodium: 134 mmol/L (ref 134–144)

## 2019-12-18 ENCOUNTER — Ambulatory Visit (HOSPITAL_COMMUNITY)
Admission: RE | Admit: 2019-12-18 | Discharge: 2019-12-19 | Disposition: A | Discharge status: Home / Self Care | Payer: Medicare Other | Attending: Cardiology | Admitting: Cardiology

## 2019-12-18 ENCOUNTER — Other Ambulatory Visit: Payer: Self-pay

## 2019-12-18 ENCOUNTER — Encounter (HOSPITAL_COMMUNITY)
Admission: RE | Disposition: A | Discharge status: Home / Self Care | Payer: Self-pay | Source: Home / Self Care | Attending: Cardiology

## 2019-12-18 ENCOUNTER — Encounter (HOSPITAL_COMMUNITY): Payer: Self-pay | Admitting: Cardiology

## 2019-12-18 DIAGNOSIS — Z7982 Long term (current) use of aspirin: Secondary | ICD-10-CM | POA: Diagnosis not present

## 2019-12-18 DIAGNOSIS — I25118 Atherosclerotic heart disease of native coronary artery with other forms of angina pectoris: Secondary | ICD-10-CM | POA: Diagnosis not present

## 2019-12-18 DIAGNOSIS — Z8249 Family history of ischemic heart disease and other diseases of the circulatory system: Secondary | ICD-10-CM | POA: Insufficient documentation

## 2019-12-18 DIAGNOSIS — R9439 Abnormal result of other cardiovascular function study: Secondary | ICD-10-CM

## 2019-12-18 DIAGNOSIS — Z955 Presence of coronary angioplasty implant and graft: Secondary | ICD-10-CM

## 2019-12-18 DIAGNOSIS — I10 Essential (primary) hypertension: Secondary | ICD-10-CM | POA: Insufficient documentation

## 2019-12-18 DIAGNOSIS — E7800 Pure hypercholesterolemia, unspecified: Secondary | ICD-10-CM | POA: Diagnosis not present

## 2019-12-18 DIAGNOSIS — Z23 Encounter for immunization: Secondary | ICD-10-CM | POA: Diagnosis not present

## 2019-12-18 DIAGNOSIS — Z79899 Other long term (current) drug therapy: Secondary | ICD-10-CM | POA: Insufficient documentation

## 2019-12-18 DIAGNOSIS — E785 Hyperlipidemia, unspecified: Secondary | ICD-10-CM | POA: Insufficient documentation

## 2019-12-18 DIAGNOSIS — I25119 Atherosclerotic heart disease of native coronary artery with unspecified angina pectoris: Secondary | ICD-10-CM | POA: Diagnosis present

## 2019-12-18 DIAGNOSIS — Z9861 Coronary angioplasty status: Secondary | ICD-10-CM

## 2019-12-18 DIAGNOSIS — I2510 Atherosclerotic heart disease of native coronary artery without angina pectoris: Secondary | ICD-10-CM | POA: Diagnosis present

## 2019-12-18 HISTORY — PX: CORONARY ATHERECTOMY: CATH118238

## 2019-12-18 HISTORY — PX: LEFT HEART CATH AND CORONARY ANGIOGRAPHY: CATH118249

## 2019-12-18 HISTORY — DX: Hyperlipidemia, unspecified: E78.5

## 2019-12-18 HISTORY — PX: CORONARY STENT INTERVENTION: CATH118234

## 2019-12-18 HISTORY — DX: Atherosclerotic heart disease of native coronary artery without angina pectoris: I25.10

## 2019-12-18 HISTORY — PX: INTRAVASCULAR ULTRASOUND/IVUS: CATH118244

## 2019-12-18 LAB — POCT ACTIVATED CLOTTING TIME
Activated Clotting Time: 279 seconds
Activated Clotting Time: 324 seconds
Activated Clotting Time: 378 seconds
Activated Clotting Time: 395 seconds

## 2019-12-18 SURGERY — LEFT HEART CATH AND CORONARY ANGIOGRAPHY
Anesthesia: LOCAL

## 2019-12-18 MED ORDER — ACETAMINOPHEN 325 MG PO TABS
650.0000 mg | ORAL_TABLET | ORAL | Status: DC | PRN
  Administered 2019-12-18 – 2019-12-19 (×2): 650 mg via ORAL
  Filled 2019-12-18 (×2): qty 2

## 2019-12-18 MED ORDER — LIDOCAINE HCL (PF) 1 % IJ SOLN
INTRAMUSCULAR | Status: DC | PRN
  Administered 2019-12-18: 2 mL via SUBCUTANEOUS

## 2019-12-18 MED ORDER — ASPIRIN EC 81 MG PO TBEC
81.0000 mg | DELAYED_RELEASE_TABLET | Freq: Every day | ORAL | Status: DC
  Administered 2019-12-19: 09:00:00 81 mg via ORAL
  Filled 2019-12-18: qty 1

## 2019-12-18 MED ORDER — SODIUM CHLORIDE 0.9 % WEIGHT BASED INFUSION
3.0000 mL/kg/h | INTRAVENOUS | Status: DC
  Administered 2019-12-18: 10:00:00 3 mL/kg/h via INTRAVENOUS

## 2019-12-18 MED ORDER — CLOPIDOGREL BISULFATE 300 MG PO TABS
ORAL_TABLET | ORAL | Status: AC
  Filled 2019-12-18: qty 2

## 2019-12-18 MED ORDER — SODIUM CHLORIDE 0.9 % IV SOLN
250.0000 mL | INTRAVENOUS | Status: DC | PRN
  Administered 2019-12-18 (×2): 250 mL via INTRAVENOUS

## 2019-12-18 MED ORDER — HEPARIN SODIUM (PORCINE) 1000 UNIT/ML IJ SOLN
INTRAMUSCULAR | Status: DC | PRN
  Administered 2019-12-18: 4000 [IU] via INTRAVENOUS
  Administered 2019-12-18 (×2): 3000 [IU] via INTRAVENOUS
  Administered 2019-12-18: 4000 [IU] via INTRAVENOUS

## 2019-12-18 MED ORDER — MIDAZOLAM HCL 2 MG/2ML IJ SOLN
INTRAMUSCULAR | Status: AC
  Filled 2019-12-18: qty 2

## 2019-12-18 MED ORDER — HEPARIN (PORCINE) IN NACL 1000-0.9 UT/500ML-% IV SOLN
INTRAVENOUS | Status: AC
  Filled 2019-12-18: qty 1000

## 2019-12-18 MED ORDER — INFLUENZA VAC SPLIT QUAD 0.5 ML IM SUSY
0.5000 mL | PREFILLED_SYRINGE | INTRAMUSCULAR | Status: AC
  Administered 2019-12-19: 14:00:00 0.5 mL via INTRAMUSCULAR
  Filled 2019-12-18 (×2): qty 0.5

## 2019-12-18 MED ORDER — SODIUM CHLORIDE 0.9% FLUSH: 3.0000 mL | Freq: Two times a day (BID) | INTRAVENOUS | Status: DC

## 2019-12-18 MED ORDER — HEPARIN SODIUM (PORCINE) 1000 UNIT/ML IJ SOLN
INTRAMUSCULAR | Status: AC
  Filled 2019-12-18: qty 1

## 2019-12-18 MED ORDER — HYDRALAZINE HCL 20 MG/ML IJ SOLN: 10.0000 mg | INTRAMUSCULAR | Status: AC | PRN

## 2019-12-18 MED ORDER — LABETALOL HCL 5 MG/ML IV SOLN: 10.0000 mg | INTRAVENOUS | Status: AC | PRN

## 2019-12-18 MED ORDER — VERAPAMIL HCL 2.5 MG/ML IV SOLN
INTRAVENOUS | Status: DC | PRN
  Administered 2019-12-18: 10 mL via INTRA_ARTERIAL

## 2019-12-18 MED ORDER — ROSUVASTATIN CALCIUM 20 MG PO TABS
20.0000 mg | ORAL_TABLET | Freq: Every day | ORAL | Status: DC
  Administered 2019-12-18: 20 mg via ORAL
  Filled 2019-12-18: qty 1

## 2019-12-18 MED ORDER — CLOPIDOGREL BISULFATE 300 MG PO TABS
ORAL_TABLET | ORAL | Status: DC | PRN
  Administered 2019-12-18: 600 mg via ORAL

## 2019-12-18 MED ORDER — CLOPIDOGREL BISULFATE 75 MG PO TABS: 75.0000 mg | ORAL_TABLET | Freq: Every day | ORAL | 1 refills | Status: DC

## 2019-12-18 MED ORDER — MIDAZOLAM HCL 2 MG/2ML IJ SOLN
INTRAMUSCULAR | Status: DC | PRN
  Administered 2019-12-18: 2 mg via INTRAVENOUS
  Administered 2019-12-18: 1 mg via INTRAVENOUS
  Administered 2019-12-18: 2 mg via INTRAVENOUS
  Administered 2019-12-18 (×2): 1 mg via INTRAVENOUS

## 2019-12-18 MED ORDER — ASPIRIN 81 MG PO CHEW: 81.0000 mg | CHEWABLE_TABLET | ORAL | Status: DC

## 2019-12-18 MED ORDER — SODIUM CHLORIDE 0.9% FLUSH
3.0000 mL | INTRAVENOUS | Status: DC | PRN
  Administered 2019-12-19: 3 mL via INTRAVENOUS

## 2019-12-18 MED ORDER — NITROGLYCERIN 0.4 MG SL SUBL: 0.4000 mg | SUBLINGUAL_TABLET | SUBLINGUAL | Status: DC | PRN

## 2019-12-18 MED ORDER — VERAPAMIL HCL 2.5 MG/ML IV SOLN
INTRAVENOUS | Status: DC | PRN
  Administered 2019-12-18: 200 ug via INTRACORONARY

## 2019-12-18 MED ORDER — SODIUM CHLORIDE 0.9 % IV SOLN: INTRAVENOUS | Status: AC

## 2019-12-18 MED ORDER — HEPARIN (PORCINE) IN NACL 1000-0.9 UT/500ML-% IV SOLN
INTRAVENOUS | Status: DC | PRN
  Administered 2019-12-18 (×2): 500 mL

## 2019-12-18 MED ORDER — METOPROLOL SUCCINATE ER 50 MG PO TB24
50.0000 mg | ORAL_TABLET | Freq: Every day | ORAL | Status: DC
  Administered 2019-12-19: 50 mg via ORAL
  Filled 2019-12-18: qty 1

## 2019-12-18 MED ORDER — SODIUM CHLORIDE 0.9 % WEIGHT BASED INFUSION
1.0000 mL/kg/h | INTRAVENOUS | Status: DC
  Administered 2019-12-18: 16:00:00 1 mL/kg/h via INTRAVENOUS

## 2019-12-18 MED ORDER — VERAPAMIL HCL 2.5 MG/ML IV SOLN
INTRAVENOUS | Status: AC
  Filled 2019-12-18: qty 2

## 2019-12-18 MED ORDER — AMLODIPINE BESYLATE 5 MG PO TABS
5.0000 mg | ORAL_TABLET | Freq: Every day | ORAL | Status: DC
  Administered 2019-12-18: 5 mg via ORAL
  Filled 2019-12-18: qty 1

## 2019-12-18 MED ORDER — TRAMADOL HCL 50 MG PO TABS
75.0000 mg | ORAL_TABLET | Freq: Two times a day (BID) | ORAL | Status: DC
  Administered 2019-12-18 – 2019-12-19 (×2): 75 mg via ORAL
  Filled 2019-12-18 (×2): qty 2

## 2019-12-18 MED ORDER — FENTANYL CITRATE (PF) 100 MCG/2ML IJ SOLN
INTRAMUSCULAR | Status: AC
  Filled 2019-12-18: qty 2

## 2019-12-18 MED ORDER — NITROGLYCERIN 1 MG/10 ML FOR IR/CATH LAB
INTRA_ARTERIAL | Status: AC
  Filled 2019-12-18: qty 10

## 2019-12-18 MED ORDER — PANTOPRAZOLE SODIUM 40 MG PO TBEC
40.0000 mg | DELAYED_RELEASE_TABLET | Freq: Every day | ORAL | Status: DC
  Administered 2019-12-19: 09:00:00 40 mg via ORAL
  Filled 2019-12-18: qty 1

## 2019-12-18 MED ORDER — ONDANSETRON HCL 4 MG/2ML IJ SOLN: 4.0000 mg | Freq: Four times a day (QID) | INTRAMUSCULAR | Status: DC | PRN

## 2019-12-18 MED ORDER — SODIUM CHLORIDE 0.9 % IV SOLN: 250.0000 mL | INTRAVENOUS | Status: DC | PRN

## 2019-12-18 MED ORDER — CLOPIDOGREL BISULFATE 75 MG PO TABS
75.0000 mg | ORAL_TABLET | Freq: Every day | ORAL | Status: DC
  Administered 2019-12-19: 09:00:00 75 mg via ORAL
  Filled 2019-12-18: qty 1

## 2019-12-18 MED ORDER — IOHEXOL 350 MG/ML SOLN
INTRAVENOUS | Status: DC | PRN
  Administered 2019-12-18: 165 mL

## 2019-12-18 MED ORDER — LIDOCAINE HCL (PF) 1 % IJ SOLN
INTRAMUSCULAR | Status: AC
  Filled 2019-12-18: qty 30

## 2019-12-18 MED ORDER — SODIUM CHLORIDE 0.9% FLUSH: 3.0000 mL | INTRAVENOUS | Status: DC | PRN

## 2019-12-18 MED ORDER — ANGIOPLASTY BOOK
Freq: Once | Status: AC
  Administered 2019-12-19: 01:00:00 1
  Filled 2019-12-18: qty 1

## 2019-12-18 MED ORDER — FENTANYL CITRATE (PF) 100 MCG/2ML IJ SOLN
INTRAMUSCULAR | Status: DC | PRN
  Administered 2019-12-18 (×3): 25 ug via INTRAVENOUS
  Administered 2019-12-18: 50 ug via INTRAVENOUS
  Administered 2019-12-18: 25 ug via INTRAVENOUS

## 2019-12-18 MED FILL — CLOPIDOGREL 75 MG TABLET: 75 | 30 days supply | Qty: 30 | Fill #0

## 2019-12-18 SURGICAL SUPPLY — 30 items
BALLN SAPPHIRE 3.0X15 (BALLOONS) ×2
BALLN SAPPHIRE ~~LOC~~ 3.5X15 (BALLOONS) ×1 IMPLANT
BALLN SAPPHIRE ~~LOC~~ 4.0X8 (BALLOONS) ×1 IMPLANT
BALLN WOLVERINE 2.50X6 (BALLOONS) ×2
BALLN WOLVERINE 2.75X6 (BALLOONS) ×2
BALLOON SAPPHIRE 3.0X15 (BALLOONS) IMPLANT
BALLOON WOLVERINE 2.50X6 (BALLOONS) IMPLANT
BALLOON WOLVERINE 2.75X6 (BALLOONS) IMPLANT
CATH LAUNCHER 6FR EBU3.5 (CATHETERS) ×1 IMPLANT
CATH OPTICROSS HD (CATHETERS) ×1 IMPLANT
CATH OPTITORQUE TIG 4.0 5F (CATHETERS) ×1 IMPLANT
CATH TELEPORT (CATHETERS) ×1 IMPLANT
CROWN DIAMONDBACK CLASSIC 1.25 (BURR) ×1 IMPLANT
DEVICE RAD COMP TR BAND LRG (VASCULAR PRODUCTS) ×1 IMPLANT
ELECT DEFIB PAD ADLT CADENCE (PAD) ×1 IMPLANT
GLIDESHEATH SLEND A-KIT 6F 22G (SHEATH) ×1 IMPLANT
GUIDEWIRE INQWIRE 1.5J.035X260 (WIRE) IMPLANT
INQWIRE 1.5J .035X260CM (WIRE) ×2
KIT ENCORE 26 ADVANTAGE (KITS) ×1 IMPLANT
KIT HEART LEFT (KITS) ×2 IMPLANT
KIT HEMO VALVE WATCHDOG (MISCELLANEOUS) ×1 IMPLANT
LUBRICANT VIPERSLIDE CORONARY (MISCELLANEOUS) ×1 IMPLANT
PACK CARDIAC CATHETERIZATION (CUSTOM PROCEDURE TRAY) ×2 IMPLANT
SLED PULL BACK IVUS (MISCELLANEOUS) ×1 IMPLANT
STENT RESOLUTE ONYX 3.0X18 (Permanent Stent) ×1 IMPLANT
STENT RESOLUTE ONYX 3.0X26 (Permanent Stent) ×1 IMPLANT
TRANSDUCER W/STOPCOCK (MISCELLANEOUS) ×2 IMPLANT
TUBING CIL FLEX 10 FLL-RA (TUBING) ×2 IMPLANT
WIRE COUGAR XT STRL 300CM (WIRE) ×1 IMPLANT
WIRE VIPERWIRE COR FLEX .012 (WIRE) ×1 IMPLANT

## 2019-12-18 NOTE — Interval H&P Note (Signed)
History and Physical Interval Note:  12/18/2019 12:59 PM  Cody Preston  has presented today for surgery, with the diagnosis of positive stress test.  The various methods of treatment have been discussed with the patient and family. After consideration of risks, benefits and other options for treatment, the patient has consented to  Procedure(s): LEFT HEART CATH AND CORONARY ANGIOGRAPHY (N/A) as a surgical intervention.  The patient's history has been reviewed, patient examined, no change in status, stable for surgery.  I have reviewed the patient's chart and labs.  Questions were answered to the patient's satisfaction.    2016/2017 Appropriate Use Criteria for Coronary Revascularization Clinical Presentation: Diabetes Mellitus? Symptom Status? S/P CABG? Antianginal Therapy (# of long-acting drugs)? Results of Non-invasive testing? FFR/iFR results in all diseased vessels? Patient undergoing renal transplant? Patient undergoing percutaneous valve procedure (TAVR, MitraClip, Others)? Symptom Status:  Ischemic Symptoms  Non-invasive Testing:  High risk  If no or indeterminate stress test, FFR/iFR results in all diseased vessels:  N/A  Diabetes Mellitus:  No  S/P CABG:  No  Antianginal therapy (number of long-acting drugs):  >=2  Patient undergoing renal transplant:  No  Patient undergoing percutaneous valve procedure:  No    newline 1 Vessel Disease PCI CABG  No proximal LAD involvement, No proximal left dominant LCX involvement A (8); Indication 2 M (6); Indication 2   Proximal left dominant LCX involvement A (8); Indication 5 A (8); Indication 5   Proximal LAD involvement A (8); Indication 5 A (8); Indication 5   newline 2 Vessel Disease  No proximal LAD involvement A (8); Indication 8 A (7); Indication 8   Proximal LAD involvement A (8); Indication 11 A (8); Indication 11   newline 3 Vessel Disease  Low disease complexity (e.g., focal stenoses, SYNTAX <=22) A (8); Indication 17 A (8);  Indication 17   Intermediate or high disease complexity (e.g., SYNTAX >=23) M (6); Indication 21 A (9); Indication 21   newline Left Main Disease  Isolated LMCA disease: ostial or midshaft A (7); Indication 24 A (9); Indication 24   Isolated LMCA disease: bifurcation involvement M (6); Indication 25 A (9); Indication 25   LMCA ostial or midshaft, concurrent low disease burden multivessel disease (e.g., 1-2 additional focal stenoses, SYNTAX <=22) A (7); Indication 26 A (9); Indication 26   LMCA ostial or midshaft, concurrent intermediate or high disease burden multivessel disease (e.g., 1-2 additional bifurcation stenoses, long stenoses, SYNTAX >=23) M (4); Indication 27 A (9); Indication 27   LMCA bifurcation involvement, concurrent low disease burden multivessel disease (e.g., 1-2 additional focal stenoses, SYNTAX <=22) M (6); Indication 28 A (9); Indication 28   LMCA bifurcation involvement, concurrent intermediate or high disease burden multivessel disease (e.g., 1-2 additional bifurcation stenoses, long stenoses, SYNTAX >=23) R (3); Indication 29 A (9); Indication [place]

## 2019-12-19 DIAGNOSIS — E7800 Pure hypercholesterolemia, unspecified: Secondary | ICD-10-CM | POA: Diagnosis not present

## 2019-12-19 DIAGNOSIS — Z7982 Long term (current) use of aspirin: Secondary | ICD-10-CM | POA: Diagnosis not present

## 2019-12-19 DIAGNOSIS — Z23 Encounter for immunization: Secondary | ICD-10-CM | POA: Diagnosis not present

## 2019-12-19 DIAGNOSIS — I25118 Atherosclerotic heart disease of native coronary artery with other forms of angina pectoris: Secondary | ICD-10-CM | POA: Diagnosis not present

## 2019-12-19 DIAGNOSIS — Z79899 Other long term (current) drug therapy: Secondary | ICD-10-CM | POA: Diagnosis not present

## 2019-12-19 DIAGNOSIS — E785 Hyperlipidemia, unspecified: Secondary | ICD-10-CM | POA: Diagnosis not present

## 2019-12-19 DIAGNOSIS — Z8249 Family history of ischemic heart disease and other diseases of the circulatory system: Secondary | ICD-10-CM | POA: Diagnosis not present

## 2019-12-19 DIAGNOSIS — R9439 Abnormal result of other cardiovascular function study: Secondary | ICD-10-CM | POA: Diagnosis not present

## 2019-12-19 DIAGNOSIS — I10 Essential (primary) hypertension: Secondary | ICD-10-CM | POA: Diagnosis not present

## 2019-12-19 LAB — CBC
HCT: 36.8 % — ABNORMAL LOW (ref 39.0–52.0)
Hemoglobin: 12.5 g/dL — ABNORMAL LOW (ref 13.0–17.0)
MCH: 29.1 pg (ref 26.0–34.0)
MCHC: 34 g/dL (ref 30.0–36.0)
MCV: 85.6 fL (ref 80.0–100.0)
Platelets: 300 10*3/uL (ref 150–400)
RBC: 4.3 MIL/uL (ref 4.22–5.81)
RDW: 13.7 % (ref 11.5–15.5)
WBC: 8.7 10*3/uL (ref 4.0–10.5)
nRBC: 0 % (ref 0.0–0.2)

## 2019-12-19 LAB — BASIC METABOLIC PANEL
Anion gap: 10 (ref 5–15)
BUN: 11 mg/dL (ref 6–20)
CO2: 26 mmol/L (ref 22–32)
Calcium: 8.6 mg/dL — ABNORMAL LOW (ref 8.9–10.3)
Chloride: 99 mmol/L (ref 98–111)
Creatinine, Ser: 0.83 mg/dL (ref 0.61–1.24)
GFR calc Af Amer: >60 mL/min (ref >60)
GFR calc non Af Amer: >60 mL/min (ref >60)
Glucose, Bld: 97 mg/dL (ref 70–99)
Potassium: 3.7 mmol/L (ref 3.5–5.1)
Sodium: 135 mmol/L (ref 135–145)

## 2019-12-19 MED ORDER — PANTOPRAZOLE SODIUM 40 MG PO TBEC: 40.0000 mg | DELAYED_RELEASE_TABLET | Freq: Every day | ORAL | 2 refills | Status: DC

## 2019-12-19 MED FILL — Nitroglycerin IV Soln 100 MCG/ML in D5W: INTRA_ARTERIAL | Qty: 10 | Status: AC

## 2019-12-19 NOTE — Discharge Summary (Addendum)
Physician Discharge Summary  Patient ID: Cody Preston MRN: [id number] DOB/AGE: [birth year] 54 y.o.  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  Patient underwent IVUS guided atherectomy and complex stenting to prox-mid LAD. Patient ambulated without chest pain this morning. He is being discharged on DAPT with Aspirin and plavix. Ibuprofen is stopped. Minimize use of tramadol. Lipoprotein a is being checked. He will follow up outpatient on 01/01/2020 with Dr. Einar Gip.   Patient had asymptoamtic 8 beat NSVT at 4 AM during sleep. With minimal physical activity, his heart rate would increase to up to 140 in sinus tachycardia without any specific chest pain complaints. I suspect this is due to deconditioning. Patient and sister brenda mentioned that he has cut down any physical activity since August 2020 due to chest pain. I anticipate his exaggerated HR response will improve with better conditioning. Continue beta blocker metoprolol succinate 50 mg for now.   Discharge Exam: Blood pressure 115/79, pulse 77, temperature 98.1 F (36.7 C), temperature source Oral, resp. rate 20, height 5\' 7"  (1.702 m), weight 63 kg, SpO2 99 %.   Physical Exam  Constitutional: He is oriented to person, place, and time. He appears well-developed and well-nourished. No distress.  HENT:  Head: Normocephalic and atraumatic.  Eyes: Pupils are equal, round, and reactive to light. Conjunctivae are normal.  Neck: No JVD present.  Cardiovascular: Normal rate, regular rhythm and intact distal pulses.  Pulmonary/Chest: Effort normal and breath sounds normal. He has no wheezes. He has no  rales.  Abdominal: Soft. Bowel sounds are normal. There is no rebound.  Musculoskeletal:        General: No edema.  Lymphadenopathy:    He has no cervical adenopathy.  Neurological: He is alert and oriented to person, place, and time. No cranial nerve deficit.  Skin: Skin is warm and dry.  Psychiatric: He has a normal mood and affect.  Nursing note and vitals reviewed.    Significant Diagnostic Studies:  EKG 12/19/19: Sinus rhythm. Normal EKG.  Coronary angiogram/intervention 12/18/19: LM: Normal LAD: Prox calcific 95% stenosis        Mid calcific 75% stenosis, followed by focal 30% stenosis        Calcified Diag 1 CTO LCx: Prox 40% stenosis RCA: Mid 20% stenosis  Diamondback 360 orbital atherectomy IVUS guided PTCA and overlapping stents prox-mid lAD     Resolute Onyx 3.0X26 mm DES prox LAD, post dilated proximally up to 4.0X8 [place] balloon     Resolute Onyx 3.0X18 mm DES mid LAD   12 months DAPT, given complex overlapping stenting   Labs:   Lab Results  Component Value Date   WBC 8.7 12/19/2019   HGB 12.5 (L) 12/19/2019   HCT 36.8 (L) 12/19/2019   MCV 85.6 12/19/2019   PLT 300 12/19/2019    Recent Labs  Lab 12/19/19 0302  NA 135  K 3.7  CL 99  CO2 26  BUN 11  CREATININE 0.83  CALCIUM 8.6*  GLUCOSE 97    Radiology: CARDIAC CATHETERIZATION  Result Date: 12/18/2019 LM: Normal LAD: Prox calcific 95% stenosis        Mid calcific 75% stenosis, followed by focal 30% stenosis  Calcified Diag 1 CTO LCx: Prox 40% stenosis RCA: Mid 20% stenosis Diamondback 360 orbital atherectomy IVUS guided PTCA and overlapping stents prox-mid lAD     Resolute Onyx 3.0X26 mm DES prox LAD, post dilated proximally up to 4.0X8 [place] balloon     Resolute Onyx 3.0X18 mm DES mid LAD 12 months DAPT, given complex overlapping stenting Nigel Mormon, MD [facility] [place] Pager: [phone] Office: [phone]    PCV ECHOCARDIOGRAM COMPLETE  Addendum Date: 11/26/2019    Echocardiogram 11/22/2019: Left ventricle cavity is normal in size and function. Normal global wall motion. Calculated EF 47%. Visual LVEF 55-60% Left atrial cavity is mildly dilated by volume. Structurally normal tricuspid valve.  Mild tricuspid regurgitation. No evidence of pulmonary hypertension.  Addendum Date: 11/26/2019   Echocardiogram 11/22/2019: Left ventricle cavity is normal in size and function. Normal global wall motion. Calculated EF 47%. Visual LVEF 55-60% Left atrial cavity is mildly dilated by volume. Structurally normal tricuspid valve.  Mild tricuspid regurgitation. No evidence of pulmonary hypertension.  Result Date: 11/26/2019 Echocardiogram 11/22/2019: Left ventricle cavity is normal in size and function. Normal global wall motion. Calculated EF 47%. Left atrial cavity is mildly dilated by volume. Structurally normal tricuspid valve.  Mild tricuspid regurgitation. No evidence of pulmonary hypertension.  PCV MYOCARDIAL PERFUSION WITH LEXISCAN  Result Date: 11/19/2019 Carlton Adam Tetrofosmin Stress Test  11/19/2019: Abnormal ECG stress. Intravenous Lexiscan protocol. Resting EKG/ECG demonstrated normal sinus rhythm. Peak EKG/ECG revealed 1 mm horizontal ST depression of the inferolateral leads. During infusion the patient developed no chest pain, and exhibited arm pain, dizziness, jaw pain, & headache. Large extent perfusion defect consistent with severe ischemia in the mid to distal anterior and anterolateral wall in LAD distribution. Gated SPECT imaging of the left ventricle demonstrating akinesis in the same region.  Stress LVEF moderately depressed at 37%. No previous exam available for comparison. High risk study.      FOLLOW UP PLANS AND APPOINTMENTS Discharge Instructions    AMB Referral to Cardiac Rehabilitation - Phase II   Complete by: As directed    Diagnosis: Coronary Stents   After initial evaluation and assessments completed: Virtual Based Care may be provided alone  or in conjunction with Phase 2 Cardiac Rehab based on patient barriers.: Yes   Diet - low sodium heart healthy   Complete by: As directed    Increase activity slowly   Complete by: As directed      Allergies as of 12/19/2019   No Known Allergies     Medication List    STOP taking these medications   ibuprofen 200 MG tablet Commonly known as: ADVIL     TAKE these medications   amLODipine 5 MG tablet Commonly known as: NORVASC Take 1 tablet (5 mg total) by mouth daily. What changed: when to take this   aspirin EC 81 MG tablet Take 81 mg by mouth daily.   clopidogrel 75 MG tablet Commonly known as: Plavix Take 1 tablet (75 mg total) by mouth daily.   esomeprazole 20 MG capsule Commonly known as: NEXIUM Take 20 mg by mouth in the morning and at bedtime.   metoprolol succinate 50 MG 24 hr tablet Commonly known as: TOPROL-XL Take 1 tablet (50 mg total) by mouth daily. Take with or immediately following a meal.   nitroGLYCERIN 0.4 MG SL tablet Commonly known as: NITROSTAT Place 1 tablet (0.4 mg total) under the tongue every 5 (five) minutes as needed for up to 25 days for chest pain.  rosuvastatin 20 MG tablet Commonly known as: CRESTOR Take 1 tablet (20 mg total) by mouth daily. What changed: when to take this   traMADol 50 MG tablet Commonly known as: ULTRAM Take 75 mg by mouth in the morning and at bedtime. For back pain         Vernell Leep MD, [facility] Pager: [phone] Office: [phone] If no answer: [phone]

## 2019-12-19 NOTE — Progress Notes (Signed)
CARDIAC REHAB PHASE I   PRE:  Rate/Rhythm: 75 SR  BP:  Sitting: 136/84      SaO2: 100 RA  MODE:  Ambulation: 250 ft   POST:  Rate/Rhythm: 120-130s ST  BP:  Sitting: 123/86    SaO2: 99 RA  Pt ambulated 25ft in hallway standby assist with hobbling gait. Pt c/o "tinge of CP, and SOB" near end of walk. Pt denied dizziness. Pt educated on importance of ASA, Plavix, and NTG. Pt given stent card and heart healthy diet. Reviewed restrictions, site care, and exercise guidelines. Reinforced importance of ASA and Plavix. Will refer to CRP II Geiger Rufina Falco, RN BSN 12/19/2019 8:35 AM

## 2019-12-19 NOTE — Progress Notes (Signed)
TR BAND REMOVAL  LOCATION:    right radial  DEFLATED PER PROTOCOL:    Yes.    TIME BAND OFF / DRESSING APPLIED:    22:30   SITE UPON ARRIVAL:    Level 0  SITE AFTER BAND REMOVAL:    Level 0  CIRCULATION SENSATION AND MOVEMENT:    Within Normal Limits   Yes.    COMMENTS:   Post TR band instructions given. Pt tolerated well. 

## 2019-12-20 LAB — LIPOPROTEIN A (LPA): Lipoprotein (a): <8.4 nmol/L (ref <75.0)

## 2019-12-24 ENCOUNTER — Encounter (HOSPITAL_COMMUNITY): Payer: Self-pay | Admitting: *Deleted

## 2019-12-24 NOTE — Progress Notes (Signed)
Referral received and verified for MD signature.  Follow up appt is 01/01/20 Insurance benefits and eligibility to be determined. Medicaid reimbursement form sent to Dr. Virgina Jock to complete. Pt will be contacted for scheduling with the return of the signed medicaid form and satisfactory completion of his follow up appt. Cherre Huger, BSN Cardiac and Training and development officer

## 2019-12-28 ENCOUNTER — Telehealth (HOSPITAL_COMMUNITY): Payer: Self-pay

## 2019-12-28 NOTE — Telephone Encounter (Signed)
Pt insurance is active and benefits verified through [facility] Medicare Co-pay 0, DED 0/0 met, out of pocket $7,550/0 met, co-insurance 20%. no pre-authorization required. Passport, 12/28/2019@3 :03pm, REF# [id number]  Will contact patient to see if he is interested in the Cardiac Rehab Program. If interested, patient will need to complete follow up appt. Once completed, patient will be contacted for scheduling upon review by the RN Navigator.  2ndary insurance is active and benefits verified through [place]. Co-pay 0, DED 0/0 met, out of pocket 0/0 met, co-insurance 0. No pre-authorization required. Passport, 12/28/2019@2 :57pm, REF# [id number]

## 2019-12-31 ENCOUNTER — Ambulatory Visit: Payer: Medicare Other | Admitting: Cardiology

## 2019-12-31 ENCOUNTER — Telehealth: Payer: Self-pay

## 2019-12-31 DIAGNOSIS — R0609 Other forms of dyspnea: Secondary | ICD-10-CM | POA: Diagnosis not present

## 2019-12-31 DIAGNOSIS — E7800 Pure hypercholesterolemia, unspecified: Secondary | ICD-10-CM | POA: Diagnosis not present

## 2019-12-31 DIAGNOSIS — I25118 Atherosclerotic heart disease of native coronary artery with other forms of angina pectoris: Secondary | ICD-10-CM | POA: Diagnosis not present

## 2019-12-31 DIAGNOSIS — I10 Essential (primary) hypertension: Secondary | ICD-10-CM | POA: Diagnosis not present

## 2019-12-31 NOTE — Progress Notes (Signed)
Primary Physician/Referring:  Iona Beard, MD  Patient ID: Cody Preston, male    DOB: 01-31-1966, 54 y.o.   MRN: [id number]  No chief complaint on file.  HPI:    Cody Preston  is a 54 y.o. 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 Recently evaluated for symptoms of angina. Underwent echocardiogram and nuclear stress test and now presents for follow up.  Patient continues to have symptoms of exertional chest discomfort and dyspnea that resolves with resting.  Nitroglycerin does seem to help his chest pain.  Sisters present at bedside.  Past Medical History:  Diagnosis Date  . Back pain   . Coronary artery disease   . Hypercholesteremia   . Hyperlipidemia    Past Surgical History:  Procedure Laterality Date  . BACK SURGERY    . BACK SURGERY    . CLUB FOOT RELEASE    . CORONARY ATHERECTOMY N/A 12/18/2019   Procedure: CORONARY ATHERECTOMY;  Surgeon: Nigel Mormon, MD;  Location: [place] [facility];  Service: Cardiovascular;  Laterality: N/A;  . CORONARY STENT INTERVENTION  12/18/2019  . CORONARY STENT INTERVENTION N/A 12/18/2019   Procedure: CORONARY STENT INTERVENTION;  Surgeon: Nigel Mormon, MD;  Location: [place] [facility];  Service: Cardiovascular;  Laterality: N/A;  . INTRAVASCULAR ULTRASOUND/IVUS N/A 12/18/2019   Procedure: Intravascular Ultrasound/IVUS;  Surgeon: Nigel Mormon, MD;  Location: [place] [facility];  Service: Cardiovascular;  Laterality: N/A;  . LEFT HEART CATH AND CORONARY ANGIOGRAPHY N/A 12/18/2019   Procedure: LEFT HEART CATH AND CORONARY ANGIOGRAPHY;  Surgeon: Nigel Mormon, MD;  Location: [place] [facility];  Service:  Cardiovascular;  Laterality: N/A;  . SHUNT REPLACEMENT     at 4 months of age   Social History   Tobacco Use  . Smoking status: Never Smoker  . Smokeless tobacco: Never Used  Substance Use Topics  . Alcohol use: Not Currently   ROS  Review of Systems  Cardiovascular: Negative for chest pain, dyspnea on exertion and leg swelling.  Gastrointestinal: Negative for melena.   Objective  There were no vitals taken for this visit.  Vitals with BMI 12/19/2019 12/19/2019 12/19/2019  Height - - -  Weight - - 138 lbs 14 oz  BMI - - 123XX123  Systolic [id number]  Diastolic 86 84 79  Pulse - - 77     Physical Exam  Neck: No thyromegaly present.  Cardiovascular: Normal rate, regular rhythm, normal heart sounds and intact distal pulses. Exam reveals no gallop.  No murmur heard. No leg edema, no JVD.  Pulmonary/Chest: Effort normal and breath sounds normal.  Abdominal: Soft. Bowel sounds are normal.  Musculoskeletal:     Cervical back: Neck supple.  Skin: Skin is warm and dry.   Laboratory examination:   Recent Labs    12/14/19 1318 12/19/19 0302  NA 134 135  K 4.6 3.7  CL 95* 99  CO2 24 26  GLUCOSE 92 97  BUN 14 11  CREATININE 0.86 0.83  CALCIUM 9.4 8.6*  GFRNONAA 100 >60  GFRAA 115 >60   estimated creatinine clearance is 92.8 mL/min (by C-G formula based on SCr of 0.83 mg/dL).  CMP Latest  Ref Rng & Units 12/19/2019 12/14/2019 10/02/2012  Glucose 70 - 99 mg/dL 97 92 124(H)  BUN 6 - 20 mg/dL 11 14 11   Creatinine 0.61 - 1.24 mg/dL 0.83 0.86 0.80  Sodium 135 - 145 mmol/L 135 134 134(L)  Potassium 3.5 - 5.1 mmol/L 3.7 4.6 3.3(L)  Chloride 98 - 111 mmol/L 99 95(L) 97  CO2 22 - 32 mmol/L 26 24 -  Calcium 8.9 - 10.3 mg/dL 8.6(L) 9.4 -   CBC Latest Ref Rng & Units 12/19/2019 12/14/2019 10/02/2012  WBC 4.0 - 10.5 K/uL 8.7 5.0 -  Hemoglobin 13.0 - 17.0 g/dL 12.5(L) 13.7 13.3  Hematocrit 39.0 - 52.0 % 36.8(L) 40.2 39.0  Platelets 150 - 400 K/uL 300 360 -   Lipid Panel  No results  found for: CHOL, TRIG, HDL, CHOLHDL, VLDL, LDLCALC, LDLDIRECT HEMOGLOBIN A1C No results found for: HGBA1C, MPG TSH No results for input(s): TSH in the last 8760 hours.  Outside labs: 04/17/2019 Cholesterol, total 162.000 m  HDL 71.000 mg 04/17/2019 LDL 77.000 mg 04/17/2019 Triglycerides 63.000 mg 04/17/2019  Medications and allergies  No Known Allergies   Current Outpatient Medications  Medication Instructions  . amLODipine (NORVASC) 5 mg, Oral, Daily  . aspirin EC 81 mg, Daily  . clopidogrel (PLAVIX) 75 mg, Oral, Daily  . metoprolol succinate (TOPROL-XL) 50 mg, Oral, Daily, Take with or immediately following a meal.  . nitroGLYCERIN (NITROSTAT) 0.4 mg, Sublingual, Every 5 min PRN  . pantoprazole (PROTONIX) 40 mg, Oral, Daily  . rosuvastatin (CRESTOR) 20 mg, Oral, Daily  . traMADol (ULTRAM) 75 mg, Oral, 2 times daily, For back pain    Radiology:  No results found.  Cardiac Studies:   Lexiscan Tetrofosmin Stress Test  11/19/2019: Abnormal ECG stress. Intravenous Lexiscan protocol. Resting EKG/ECG demonstrated normal sinus rhythm. Peak EKG/ECG revealed 1 mm horizontal ST depression of the inferolateral leads. During infusion the patient developed no chest pain, and exhibited arm pain, dizziness, jaw pain, & headache. Large extent perfusion defect consistent with severe ischemia in the mid to distal anterior and anterolateral wall in LAD distribution.  Gated SPECT imaging of the left ventricle demonstrating akinesis in the same region.  Stress LVEF moderately depressed at 37%.  No previous exam available for comparison. High risk study.   Echocardiogram 11/22/2019:  Left ventricle cavity is normal in size and function. Normal global wall  motion. Calculated EF 47%. Visual LVEF 55-60%  Left atrial cavity is mildly dilated by volume.  Structurally normal tricuspid valve. Mild tricuspid regurgitation. No  evidence of pulmonary hypertension.   Coronary angiogram 12/18/2019: Left  main normal, proximal and mid calcified 95 and 75% stenosis, moderate disease in left circumflex stent.   SP orbital atherectomy followed by stenting 0.0 x 46 mm and a 3.0 x 18 mm resolute Onyx DES, proximal stent placed dilated to 3.0 x 8 mm [place] balloon.  EKG 12/19/2019: Sinus tachycardia at rate of 101 bpm, normal axis, no evidence of ischemia.  Low-voltage complexes.  No significant change from 11/15/2018.  Assessment     ICD-10-CM   1. Coronary artery disease of native artery of native heart with stable angina pectoris ([place])  I25.118   2. Primary hypertension  I10   3. Hypercholesteremia  E78.00   4. Dyspnea on exertion  R06.00      No orders of the defined types were placed in this encounter.   There are no discontinued medications.   Recommendations:   Cody Preston  is a 54 y.o.  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   I have reviewed and discussed his recent echocardiogram and nuclear stress test results with the patient and his sister. Given his abnormal stress test findings and his persistent symptoms of angina, feel that we should pursue coronary angiogram for further evaluation. Patient and his sister agree. Schedule for cardiac catheterization, and possible angioplasty. We discussed regarding risks, benefits, alternatives to this including stress testing, CTA and continued medical therapy. Patient wants to proceed. Understands <1-2% risk of death, stroke, MI, urgent CABG, bleeding, infection, renal failure but not limited to these.  His blood pressure continues to be elevated, will add amlodipine 5 mg daily both for angina and hypertension.  Continue with other present medications. Encouraged him to avoid any strenuous activities until he can have further evaluation.   He is on Simvastatin for hyperlipidemia, in view of abnormal nuclear stress test, will change to Crestor for improved LDL reduction. Will need reevaluation in the next 8 weeks. Will see him back after the procedure for follow up.    [name], MD, [facility] 03/12/2020, 11:08 PM [place] [facility]. [place] Office: [phone]

## 2020-01-01 ENCOUNTER — Ambulatory Visit: Payer: Medicare Other | Admitting: Cardiology

## 2020-01-01 DIAGNOSIS — I2510 Atherosclerotic heart disease of native coronary artery without angina pectoris: Secondary | ICD-10-CM | POA: Diagnosis not present

## 2020-01-01 DIAGNOSIS — I10 Essential (primary) hypertension: Secondary | ICD-10-CM | POA: Diagnosis not present

## 2020-01-01 DIAGNOSIS — E785 Hyperlipidemia, unspecified: Secondary | ICD-10-CM | POA: Diagnosis not present

## 2020-01-18 ENCOUNTER — Ambulatory Visit: Payer: Medicare Other | Admitting: Cardiology

## 2020-02-15 ENCOUNTER — Other Ambulatory Visit: Payer: Self-pay | Admitting: Cardiology

## 2020-02-15 DIAGNOSIS — I209 Angina pectoris, unspecified: Secondary | ICD-10-CM

## 2020-02-18 ENCOUNTER — Ambulatory Visit: Payer: Medicare Other | Admitting: Cardiology

## 2020-02-20 ENCOUNTER — Other Ambulatory Visit: Payer: Self-pay | Admitting: Cardiology

## 2020-03-12 ENCOUNTER — Other Ambulatory Visit: Payer: Self-pay | Admitting: Cardiology

## 2020-03-12 ENCOUNTER — Encounter: Payer: Self-pay | Admitting: Cardiology

## 2020-03-18 ENCOUNTER — Other Ambulatory Visit: Payer: Self-pay | Admitting: Cardiology

## 2020-04-08 DIAGNOSIS — E785 Hyperlipidemia, unspecified: Secondary | ICD-10-CM | POA: Diagnosis not present

## 2020-04-08 DIAGNOSIS — I10 Essential (primary) hypertension: Secondary | ICD-10-CM | POA: Diagnosis not present

## 2020-04-08 DIAGNOSIS — I2510 Atherosclerotic heart disease of native coronary artery without angina pectoris: Secondary | ICD-10-CM | POA: Diagnosis not present

## 2020-04-10 ENCOUNTER — Other Ambulatory Visit: Payer: Self-pay

## 2020-04-10 MED ORDER — PANTOPRAZOLE SODIUM 40 MG PO TBEC: 40.0000 mg | DELAYED_RELEASE_TABLET | Freq: Every day | ORAL | 0 refills | Status: DC

## 2020-04-19 ENCOUNTER — Inpatient Hospital Stay (HOSPITAL_COMMUNITY)
Admission: EM | Admit: 2020-04-19 | Discharge: 2020-04-22 | DRG: 250 | Disposition: A | Discharge status: Home / Self Care | Payer: Medicare Other | Attending: Cardiology | Admitting: Cardiology

## 2020-04-19 ENCOUNTER — Emergency Department (HOSPITAL_COMMUNITY): Payer: Medicare Other

## 2020-04-19 ENCOUNTER — Encounter (HOSPITAL_COMMUNITY)
Admission: EM | Disposition: A | Discharge status: Home / Self Care | Payer: Self-pay | Source: Home / Self Care | Attending: Cardiology

## 2020-04-19 ENCOUNTER — Encounter (HOSPITAL_COMMUNITY): Payer: Self-pay

## 2020-04-19 DIAGNOSIS — I472 Ventricular tachycardia: Secondary | ICD-10-CM | POA: Diagnosis not present

## 2020-04-19 DIAGNOSIS — I499 Cardiac arrhythmia, unspecified: Secondary | ICD-10-CM | POA: Diagnosis not present

## 2020-04-19 DIAGNOSIS — Z7982 Long term (current) use of aspirin: Secondary | ICD-10-CM | POA: Diagnosis not present

## 2020-04-19 DIAGNOSIS — Z9114 Patient's other noncompliance with medication regimen: Secondary | ICD-10-CM | POA: Diagnosis not present

## 2020-04-19 DIAGNOSIS — M549 Dorsalgia, unspecified: Secondary | ICD-10-CM | POA: Diagnosis not present

## 2020-04-19 DIAGNOSIS — Z8249 Family history of ischemic heart disease and other diseases of the circulatory system: Secondary | ICD-10-CM | POA: Diagnosis not present

## 2020-04-19 DIAGNOSIS — Z955 Presence of coronary angioplasty implant and graft: Secondary | ICD-10-CM | POA: Diagnosis not present

## 2020-04-19 DIAGNOSIS — Z79899 Other long term (current) drug therapy: Secondary | ICD-10-CM

## 2020-04-19 DIAGNOSIS — R570 Cardiogenic shock: Secondary | ICD-10-CM | POA: Diagnosis present

## 2020-04-19 DIAGNOSIS — I25110 Atherosclerotic heart disease of native coronary artery with unstable angina pectoris: Secondary | ICD-10-CM | POA: Diagnosis present

## 2020-04-19 DIAGNOSIS — I214 Non-ST elevation (NSTEMI) myocardial infarction: Secondary | ICD-10-CM | POA: Diagnosis present

## 2020-04-19 DIAGNOSIS — E7800 Pure hypercholesterolemia, unspecified: Secondary | ICD-10-CM | POA: Diagnosis present

## 2020-04-19 DIAGNOSIS — E782 Mixed hyperlipidemia: Secondary | ICD-10-CM | POA: Diagnosis not present

## 2020-04-19 DIAGNOSIS — R45851 Suicidal ideations: Secondary | ICD-10-CM | POA: Diagnosis not present

## 2020-04-19 DIAGNOSIS — J90 Pleural effusion, not elsewhere classified: Secondary | ICD-10-CM | POA: Diagnosis not present

## 2020-04-19 DIAGNOSIS — Z743 Need for continuous supervision: Secondary | ICD-10-CM | POA: Diagnosis not present

## 2020-04-19 DIAGNOSIS — I10 Essential (primary) hypertension: Secondary | ICD-10-CM | POA: Diagnosis not present

## 2020-04-19 DIAGNOSIS — Z7901 Long term (current) use of anticoagulants: Secondary | ICD-10-CM

## 2020-04-19 DIAGNOSIS — I200 Unstable angina: Secondary | ICD-10-CM

## 2020-04-19 DIAGNOSIS — R079 Chest pain, unspecified: Secondary | ICD-10-CM | POA: Diagnosis not present

## 2020-04-19 DIAGNOSIS — Z20822 Contact with and (suspected) exposure to covid-19: Secondary | ICD-10-CM | POA: Diagnosis present

## 2020-04-19 DIAGNOSIS — W19XXXA Unspecified fall, initial encounter: Secondary | ICD-10-CM | POA: Diagnosis not present

## 2020-04-19 DIAGNOSIS — I2121 ST elevation (STEMI) myocardial infarction involving left circumflex coronary artery: Secondary | ICD-10-CM | POA: Diagnosis not present

## 2020-04-19 DIAGNOSIS — F4325 Adjustment disorder with mixed disturbance of emotions and conduct: Secondary | ICD-10-CM | POA: Diagnosis present

## 2020-04-19 DIAGNOSIS — R0789 Other chest pain: Secondary | ICD-10-CM | POA: Diagnosis not present

## 2020-04-19 HISTORY — PX: LEFT HEART CATH AND CORONARY ANGIOGRAPHY: CATH118249

## 2020-04-19 HISTORY — PX: CORONARY/GRAFT ACUTE MI REVASCULARIZATION: CATH118305

## 2020-04-19 HISTORY — PX: CORONARY THROMBECTOMY: CATH118304

## 2020-04-19 LAB — CBC
HCT: 39 % (ref 39.0–52.0)
Hemoglobin: 12.8 g/dL — ABNORMAL LOW (ref 13.0–17.0)
MCH: 29.1 pg (ref 26.0–34.0)
MCHC: 32.8 g/dL (ref 30.0–36.0)
MCV: 88.6 fL (ref 80.0–100.0)
Platelets: 325 10*3/uL (ref 150–400)
RBC: 4.4 MIL/uL (ref 4.22–5.81)
RDW: 12.5 % (ref 11.5–15.5)
WBC: 9.3 10*3/uL (ref 4.0–10.5)
nRBC: 0 % (ref 0.0–0.2)

## 2020-04-19 LAB — BASIC METABOLIC PANEL
Anion gap: 10 (ref 5–15)
BUN: 16 mg/dL (ref 6–20)
CO2: 24 mmol/L (ref 22–32)
Calcium: 9.2 mg/dL (ref 8.9–10.3)
Chloride: 98 mmol/L (ref 98–111)
Creatinine, Ser: 1.03 mg/dL (ref 0.61–1.24)
GFR calc Af Amer: >60 mL/min (ref >60)
GFR calc non Af Amer: >60 mL/min (ref >60)
Glucose, Bld: 123 mg/dL — ABNORMAL HIGH (ref 70–99)
Potassium: 4.2 mmol/L (ref 3.5–5.1)
Sodium: 132 mmol/L — ABNORMAL LOW (ref 135–145)

## 2020-04-19 LAB — TROPONIN I (HIGH SENSITIVITY): Troponin I (High Sensitivity): 37 ng/L — ABNORMAL HIGH (ref <18)

## 2020-04-19 SURGERY — CORONARY/GRAFT ACUTE MI REVASCULARIZATION
Anesthesia: LOCAL

## 2020-04-19 MED ORDER — NOREPINEPHRINE BITARTRATE 1 MG/ML IV SOLN
INTRAVENOUS | Status: DC | PRN
  Administered 2020-04-19: 10 ug/min via INTRAVENOUS

## 2020-04-19 MED ORDER — HEPARIN (PORCINE) IN NACL 1000-0.9 UT/500ML-% IV SOLN
INTRAVENOUS | Status: AC
  Filled 2020-04-19: qty 1000

## 2020-04-19 MED ORDER — HEPARIN BOLUS VIA INFUSION
3500.0000 [IU] | Freq: Once | INTRAVENOUS | Status: AC
  Administered 2020-04-19: 3500 [IU] via INTRAVENOUS
  Filled 2020-04-19: qty 3500

## 2020-04-19 MED ORDER — IOHEXOL 350 MG/ML SOLN
INTRAVENOUS | Status: AC
  Filled 2020-04-19: qty 1

## 2020-04-19 MED ORDER — ATROPINE SULFATE 1 MG/ML IJ SOLN: 0.4000 mg | Freq: Once | INTRAMUSCULAR | Status: DC

## 2020-04-19 MED ORDER — LIDOCAINE HCL (PF) 1 % IJ SOLN
INTRAMUSCULAR | Status: DC | PRN
  Administered 2020-04-19: 2 mL

## 2020-04-19 MED ORDER — ONDANSETRON HCL 4 MG/2ML IJ SOLN
INTRAMUSCULAR | Status: AC
  Filled 2020-04-19: qty 2

## 2020-04-19 MED ORDER — FENTANYL CITRATE (PF) 100 MCG/2ML IJ SOLN
50.0000 ug | Freq: Once | INTRAMUSCULAR | Status: AC
  Administered 2020-04-19: 50 ug via INTRAVENOUS
  Filled 2020-04-19: qty 2

## 2020-04-19 MED ORDER — HEPARIN (PORCINE) 25000 UT/250ML-% IV SOLN
850.0000 [IU]/h | INTRAVENOUS | Status: DC
  Administered 2020-04-19: 850 [IU]/h via INTRAVENOUS
  Filled 2020-04-19: qty 250

## 2020-04-19 MED ORDER — SODIUM CHLORIDE 0.9 % IV BOLUS
1000.0000 mL | Freq: Once | INTRAVENOUS | Status: AC
  Administered 2020-04-19: 1000 mL via INTRAVENOUS

## 2020-04-19 MED ORDER — NITROGLYCERIN IN D5W 200-5 MCG/ML-% IV SOLN
0.0000 ug/min | INTRAVENOUS | Status: DC
  Filled 2020-04-19: qty 250

## 2020-04-19 MED ORDER — DOPAMINE-DEXTROSE 3.2-5 MG/ML-% IV SOLN
INTRAVENOUS | Status: AC
  Administered 2020-04-19: 10 ug/kg/min via INTRAVENOUS
  Filled 2020-04-19: qty 250

## 2020-04-19 MED ORDER — VERAPAMIL HCL 2.5 MG/ML IV SOLN
INTRAVENOUS | Status: AC
  Filled 2020-04-19: qty 2

## 2020-04-19 MED ORDER — HEPARIN SODIUM (PORCINE) 1000 UNIT/ML IJ SOLN
INTRAMUSCULAR | Status: DC | PRN
  Administered 2020-04-19: 8000 [IU] via INTRAVENOUS
  Administered 2020-04-20: 5000 [IU] via INTRAVENOUS

## 2020-04-19 MED ORDER — VERAPAMIL HCL 2.5 MG/ML IV SOLN
INTRAVENOUS | Status: DC | PRN
  Administered 2020-04-19: 10 mL via INTRA_ARTERIAL

## 2020-04-19 MED ORDER — SODIUM CHLORIDE 0.9% FLUSH
3.0000 mL | Freq: Two times a day (BID) | INTRAVENOUS | Status: DC
  Administered 2020-04-20 (×2): 3 mL via INTRAVENOUS

## 2020-04-19 MED ORDER — HEPARIN (PORCINE) IN NACL 1000-0.9 UT/500ML-% IV SOLN
INTRAVENOUS | Status: DC | PRN
  Administered 2020-04-19 (×3): 500 mL

## 2020-04-19 MED ORDER — ATROPINE SULFATE 1 MG/10ML IJ SOSY
PREFILLED_SYRINGE | INTRAMUSCULAR | Status: AC
  Filled 2020-04-19: qty 10

## 2020-04-19 MED ORDER — DOPAMINE-DEXTROSE 3.2-5 MG/ML-% IV SOLN: 0.0000 ug/kg/min | INTRAVENOUS | Status: DC

## 2020-04-19 MED ORDER — ONDANSETRON HCL 4 MG/2ML IJ SOLN
4.0000 mg | Freq: Once | INTRAMUSCULAR | Status: AC
  Administered 2020-04-19: 4 mg via INTRAVENOUS

## 2020-04-19 MED ORDER — NOREPINEPHRINE 4 MG/250ML-% IV SOLN
INTRAVENOUS | Status: AC
  Filled 2020-04-19: qty 250

## 2020-04-19 MED ORDER — NITROGLYCERIN 1 MG/10 ML FOR IR/CATH LAB
INTRA_ARTERIAL | Status: AC
  Filled 2020-04-19: qty 10

## 2020-04-19 MED ORDER — ATROPINE SULFATE 1 MG/10ML IJ SOSY
1.0000 mg | PREFILLED_SYRINGE | Freq: Once | INTRAMUSCULAR | Status: AC
  Administered 2020-04-19: 1 mg via INTRAVENOUS

## 2020-04-19 SURGICAL SUPPLY — 23 items
BALLN SAPPHIRE 2.5X12 (BALLOONS) ×4
BALLOON SAPPHIRE 2.5X12 (BALLOONS) IMPLANT
CATH 5FR JL3.5 JR4 ANG PIG MP (CATHETERS) ×1 IMPLANT
CATH EXTRAC PRONTO 5.5F 138CM (CATHETERS) ×1 IMPLANT
CATH LAUNCHER 6FR AL1 (CATHETERS) IMPLANT
CATH LAUNCHER 6FR EBU3.5 (CATHETERS) ×1 IMPLANT
CATH LAUNCHER 6FR JL4 (CATHETERS) ×1 IMPLANT
CATH VISTA GUIDE 6FR XB3.5 (CATHETERS) ×1 IMPLANT
CATHETER LAUNCHER 6FR AL1 (CATHETERS) ×2
DEVICE RAD COMP TR BAND LRG (VASCULAR PRODUCTS) ×1 IMPLANT
GLIDESHEATH SLEND A-KIT 6F 22G (SHEATH) ×1 IMPLANT
GUIDELINER 6F (CATHETERS) ×1 IMPLANT
GUIDEWIRE INQWIRE 1.5J.035X260 (WIRE) IMPLANT
INQWIRE 1.5J .035X260CM (WIRE) ×2
KIT ENCORE 26 ADVANTAGE (KITS) ×1 IMPLANT
KIT HEART LEFT (KITS) ×2 IMPLANT
PACK CARDIAC CATHETERIZATION (CUSTOM PROCEDURE TRAY) ×2 IMPLANT
SHEATH PROBE COVER 6X72 (BAG) ×1 IMPLANT
TRANSDUCER W/STOPCOCK (MISCELLANEOUS) ×2 IMPLANT
TUBING CIL FLEX 10 FLL-RA (TUBING) ×2 IMPLANT
WIRE COUGAR XT STRL 190CM (WIRE) ×1 IMPLANT
WIRE MAILMAN 182CM (WIRE) ×1 IMPLANT
WIRE RUNTHROUGH .014X180CM (WIRE) ×1 IMPLANT

## 2020-04-19 NOTE — H&P (Signed)
Primary Physician/Referring:  Iona Beard, MD  Patient ID: Cody Preston, male    DOB: 1966/06/05, 54 y.o.   MRN: [id number]  Chief Complaint  Patient presents with  . Chest Pain   HPI:    Cody Preston  is a 54 y.o. 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  He underwent coronary angiography and complex angioplasty with orbital atherectomy followed by stent to the proximal LAD on 12/18/2019, and since angioplasty he has not seen his back, even after multiple attempts to reach him.  He had been doing well until this afternoon around 2:00 when he went to visit his brother, started having chest tightness, progressively getting worse to the point that EMS was called, he presented to the emergency room late this evening via EMS.  EKG revealed hyperacute inferior T wave abnormality and ST depression in the anterior leads but no STEMI, but in view of continued chest pain, hypotension, bradycardia, I was called to see the patient.  Patient continues to have severe chest pain with radiation to his jaw, feels extremely nauseous, threw up once while I was examining him.  I have activated the Cath Lab to take him to the cardiac catheterization lab.  I also started him on dopamine and in view of bradycardia, 1 ampoule of atropine was given to the patient along with Zofran.  Past Medical History:  Diagnosis Date  . Back pain   . Coronary artery disease   . Hypercholesteremia   . Hyperlipidemia    Past Surgical History:  Procedure Laterality Date  . BACK SURGERY    . BACK SURGERY    . CLUB FOOT RELEASE    . CORONARY ATHERECTOMY N/A 12/18/2019   Procedure: CORONARY ATHERECTOMY;  Surgeon:  Nigel Mormon, MD;  Location: [facility];  Service: Cardiovascular;  Laterality: N/A;  . CORONARY STENT INTERVENTION  12/18/2019  . CORONARY STENT INTERVENTION N/A 12/18/2019   Procedure: CORONARY STENT INTERVENTION;  Surgeon: Nigel Mormon, MD;  Location: [facility];  Service: Cardiovascular;  Laterality: N/A;  . INTRAVASCULAR ULTRASOUND/IVUS N/A 12/18/2019   Procedure: Intravascular Ultrasound/IVUS;  Surgeon: Nigel Mormon, MD;  Location: [facility];  Service: Cardiovascular;  Laterality: N/A;  . LEFT HEART CATH AND CORONARY ANGIOGRAPHY N/A 12/18/2019   Procedure: LEFT HEART CATH AND CORONARY ANGIOGRAPHY;  Surgeon: Nigel Mormon, MD;  Location: [facility];  Service: Cardiovascular;  Laterality: N/A;  . SHUNT REPLACEMENT     at 81 months of age   Social History   Tobacco Use  . Smoking status: Never Smoker  . Smokeless tobacco: Never Used  Substance Use Topics  . Alcohol use: Not Currently   ROS  Review of Systems  Cardiovascular: Positive for chest pain and dyspnea on exertion. Negative for leg swelling.  Respiratory: Positive for shortness of breath.   Gastrointestinal: Positive for nausea and vomiting. Negative for melena.  All other systems reviewed and are negative.  Objective  Blood pressure (!) 67/48, pulse 67, temperature 98.1 F (36.7 C), temperature source Oral, resp. rate (!) 34, height 5\' 7"  (1.702 m), weight 68.9 kg, SpO2 96 %.  Vitals with BMI 04/19/2020 04/19/2020 04/19/2020  Height - - -  Weight - - -  BMI - - -  Systolic - - 67  Diastolic - - 48  Pulse 67 39 38     Physical Exam Constitutional:      General: He is in acute distress.     Appearance: He is ill-appearing and diaphoretic.  Neck:     Thyroid: No thyromegaly.  Cardiovascular:     Rate and Rhythm: Normal rate and regular rhythm.     Pulses: Intact distal pulses.     Heart sounds: Normal heart sounds. No murmur heard.  No gallop.       Comments: No leg edema, no JVD. Pulmonary:     Effort: Pulmonary effort is normal.     Breath sounds: Normal breath sounds.  Abdominal:     General: Bowel sounds are normal.     Palpations: Abdomen is soft.  Skin:    General: Skin is warm.     Coloration: Skin is pale.  Neurological:     Mental Status: He is alert.    Laboratory examination:   Recent Labs    12/14/19 1318 12/19/19 0302 04/19/20 2053  NA 134 135 132*  K 4.6 3.7 4.2  CL 95* 99 98  CO2 24 26 24   GLUCOSE 92 97 123*  BUN 14 11 16   CREATININE 0.86 0.83 1.03  CALCIUM 9.4 8.6* 9.2  GFRNONAA 100 >60 >60  GFRAA 115 >60 >60   estimated creatinine clearance is 78.4 mL/min (by C-G formula based on SCr of 1.03 mg/dL).  CMP Latest Ref Rng & Units 04/19/2020 12/19/2019 12/14/2019  Glucose 70 - 99 mg/dL 123(H) 97 92  BUN 6 - 20 mg/dL 16 11 14   Creatinine 0.61 - 1.24 mg/dL 1.03 0.83 0.86  Sodium 135 - 145 mmol/L 132(L) 135 134  Potassium 3.5 - 5.1 mmol/L 4.2 3.7 4.6  Chloride 98 - 111 mmol/L 98 99 95(L)  CO2 22 - 32 mmol/L 24 26 24   Calcium 8.9 - 10.3 mg/dL 9.2 8.6(L) 9.4   CBC Latest Ref Rng & Units 04/19/2020 12/19/2019 12/14/2019  WBC 4.0 - 10.5 K/uL 9.3 8.7 5.0  Hemoglobin 13.0 - 17.0 g/dL 12.8(L) 12.5(L) 13.7  Hematocrit 39 - 52 % 39.0 36.8(L) 40.2  Platelets 150 - 400 K/uL 325 300 360   Lipid Panel  No results found for: CHOL, TRIG, HDL, CHOLHDL, VLDL, LDLCALC, LDLDIRECT HEMOGLOBIN A1C No results found for: HGBA1C, MPG TSH No results for input(s): TSH in the last 8760 hours.  Outside labs: 04/17/2019 Cholesterol, total 162.000 m  HDL 71.000 mg 04/17/2019 LDL 77.000 mg 04/17/2019 Triglycerides 63.000 mg 04/17/2019  Medications and allergies  No Known Allergies   Current Outpatient Medications  Medication Instructions  . amLODipine (NORVASC) 5 mg, Oral, Daily  . aspirin EC 81 mg, Daily  . clopidogrel (PLAVIX) 75 mg, Oral, Daily  . metoprolol succinate (TOPROL-XL) 50 MG 24 hr tablet TAKE 1 TABLET BY MOUTH  DAILY. TAKE WITH OR IMMEDIATELY FOLLOWING A MEAL.  . nitroGLYCERIN (NITROSTAT) 0.4 mg, Sublingual, Every 5 min PRN  . pantoprazole (PROTONIX) 40 mg, Oral, Daily  . rosuvastatin (CRESTOR) 20 mg, Oral, Daily  . traMADol (ULTRAM) 75 mg, Oral, 2 times daily, For back pain    Radiology:  DG Chest 2 View  Result Date: 04/19/2020 CLINICAL DATA:  Chest pain EXAM: CHEST - 2 VIEW COMPARISON:  Radiograph 03/07/2014, CT 08/30/2014 FINDINGS: Chronically coarsened interstitial and bronchitic features are similar to comparison exams. Mild hyperinflation and flattening of  the diaphragms. No consolidation, features of edema, pneumothorax, or effusion. The cardiomediastinal contours are unremarkable. No acute osseous or soft tissue abnormality. Telemetry leads overlie the chest. IMPRESSION: 1. No acute cardiopulmonary disease. 2. Chronically coarsened interstitial and bronchitic features with hyperinflation. Electronically Signed   By: Lovena Le M.D.   On: 04/19/2020 21:45    Cardiac Studies:   Lexiscan Tetrofosmin Stress Test  11/19/2019: Abnormal ECG stress. Intravenous Lexiscan protocol. Resting EKG/ECG demonstrated normal sinus rhythm. Peak EKG/ECG revealed 1 mm horizontal ST depression of the inferolateral leads. During infusion the patient developed no chest pain, and exhibited arm pain, dizziness, jaw pain, & headache. Large extent perfusion defect consistent with severe ischemia in the mid to distal anterior and anterolateral wall in LAD distribution.  Gated SPECT imaging of the left ventricle demonstrating akinesis in the same region.  Stress LVEF moderately depressed at 37%.  No previous exam available for comparison. High risk study.   Echocardiogram 11/22/2019:  Left ventricle cavity is normal in size and function. Normal global wall  motion. Calculated EF 47%. Visual LVEF 55-60%  Left atrial cavity is mildly dilated by volume.  Structurally normal tricuspid valve. Mild tricuspid  regurgitation. No  evidence of pulmonary hypertension.   Coronary angiogram 12/18/2019: Left main normal, proximal and mid calcified 95 and 75% stenosis, moderate disease in left circumflex stent.   SP orbital atherectomy followed by stenting 3.0x26 mm and a 3.0 x 18 mm resolute Onyx DES, proximal stent placed dilated to 4.0 x 8 mm [place] balloon.  EKG 12/19/2019: Sinus tachycardia at rate of 101 bpm, normal axis, no evidence of ischemia.  Low-voltage complexes.  No significant change from 11/15/2018.  EKG 04/19/2020: Marked sinus bradycardia, hyperacute T abnormalities inferior leads with ST depression in V1 to V4, does not meet criteria for ischemia or acute ST elevation MI but very suspicious for ACS.  Assessment     ICD-10-CM   1. Unstable angina (HCC)  I20.0   2.  Coronary artery disease with unstable angina, history of angioplasty to proximal LAD on 12/18/2019.  Now presented with cardiogenic shock with hypotension, bradycardia and abnormal EKG. 3.  Primary hypertension 4.  Family history of premature coronary artery disease with mother and brother having had MI at age of 53 years of age respectively. 5.  Hypercholesterolemia  Meds ordered this encounter  Medications  . sodium chloride 0.9 % bolus 1,000 mL  . fentaNYL (SUBLIMAZE) injection 50 mcg  . nitroGLYCERIN 50 mg in dextrose 5 % 250 mL (0.2 mg/mL) infusion  . sodium chloride 0.9 % bolus 1,000 mL  . heparin bolus via infusion 3,500 Units  . heparin ADULT infusion 100 units/mL (25000 units/253mL sodium chloride 0.45%)  . DOPamine (INTROPIN) 800 mg in dextrose 5 % 250 mL (3.2 mg/mL) infusion  . DOPamine (INTROPIN) 3.2-5 MG/ML-% infusion    Cody Preston, Cody Preston   : cabinet override  . DISCONTD: atropine injection 0.4 mg  . atropine 1 MG/10ML injection 1 mg  . atropine 1 MG/10ML injection    Cody Preston, Cody Preston   : cabinet override  . sodium chloride 0.9 % bolus 1,000 mL  . ondansetron (ZOFRAN) injection 4 mg  . ondansetron (ZOFRAN) 4  MG/2ML injection    Cody Preston, Cody Preston  : cabinet override    Medications Discontinued During This Encounter  Medication Reason  . atropine injection 0.4 mg      Recommendations:   Cody Preston  is a 54 y.o.  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 He underwent coronary angiography and complex angioplasty with orbital atherectomy followed by stent to the proximal LAD on 12/18/2019.  Unfortunately he has not had any follow-up with [place] in spite of [place] trying to reach him.  He has not taken his Plavix since angioplasty took it for a few days.  He is in cardiogenic shock with hypotension, ongoing chest pain and EKG revealing ST depressions in the anterior leads and hyperacute appearing T waves abnormality in the inferior leads.  Her taken to the cardiac catheter lab on a emergent basis.  I have discussed with his brother who is at the bedside regarding risks associated with this, as patient is extremely sick will take him on a emergent basis.  I will make further recommendations after that.  Critical care time spent 40 minutes  [name], MD, [facility] 04/19/2020, 11:03 PM [facility] [place] Office: [phone]

## 2020-04-19 NOTE — Progress Notes (Signed)
ANTICOAGULATION CONSULT NOTE - Initial Consult  Pharmacy Consult for heparin Indication: chest pain/ACS  No Known Allergies  Patient Measurements: Height: 5\' 7"  (170.2 cm) Weight: 68.9 kg (152 lb) IBW/kg (Calculated) : 66.1 Heparin Dosing Weight: 68 kg  Vital Signs: Temp: 98.1 F (36.7 C) (06/26 2054) Temp Source: Oral (06/26 2054) BP: 92/62 (06/26 2227) Pulse Rate: 50 (06/26 2227)  Labs: Recent Labs    04/19/20 2053  HGB 12.8*  HCT 39.0  PLT 325  CREATININE 1.03  TROPONINIHS 37*    Estimated Creatinine Clearance: 78.4 mL/min (by C-G formula based on SCr of 1.03 mg/dL).   Medical History: Past Medical History:  Diagnosis Date  . Back pain   . Coronary artery disease   . Hypercholesteremia   . Hyperlipidemia     Medications: See med history  Assessment: 54 yo man with chest pain to start heparin.  He was not on anticoagulation PTA Goal of Therapy:  Heparin level 0.3-0.7 units/ml Monitor platelets by anticoagulation protocol: Yes   Plan:  Heparin bolus 3500 units and drip at 850 units/hr Check heparin level 6 hours after start and daily CBC daily Monitor for bleeding complications  Cody Preston 04/19/2020,10:28 PM

## 2020-04-19 NOTE — ED Triage Notes (Signed)
Pt came in [facility], c/o of CP that started around 1500 today. Pt took Nitro at home and had minor relief. The pain started to come back, called EMS. EMS gave 324ASA, and 1Nitro in route. Pt does has some minor pain relief with that but it is now coming back. Pt describes the pain centralized CP that radiated into his jaw.  120/78, 127CBG, 65HR, 98%RA.  18G LAC

## 2020-04-19 NOTE — Code Documentation (Signed)
Responded to Code STEMI called at 2253 on pt already in ED. Assisted with cath lab prep and transported to cath lab with ED RN and Dr. Einar Gip.

## 2020-04-19 NOTE — ED Notes (Signed)
Pad placed on pt d/t bradycardia rate 35-40. MD at bedside.

## 2020-04-19 NOTE — ED Provider Notes (Signed)
[facility] EMERGENCY DEPARTMENT Provider Note   CSN: [id number] Arrival date & time: 04/19/20  2046     History Chief Complaint  Patient presents with  . Chest Pain    Cody Preston is a 54 y.o. male.  HPI   Pt is a 54 y/o male with a h/o CAD, HLD, HTN who presents to the ED today for eval of chest pain that started 2pm while he was watching. Pain located to the mid chest and radiates to the jaw. Pain rated 10/10 and feels like a stabbing pain. Denies specific exacerbating or alleviating factors. States he feels like his head is hot but he does not have diaphoresis. He denies associated SOB, nausea, vomiting, or abd pain. States he had similar pain last summer and was told it was his heart. He recently had 2 stents placed in Feb 2021.  He took 2 NTG at home and it did not resolve his sxs. He received 325 mg ASA with EMS PTA.  States he is supposed to be on a blood thinner (plavix? - listed in chart) but he is not taking it because he is afraid he will fall and have bleeding.    Past Medical History:  Diagnosis Date  . Back pain   . Coronary artery disease   . Hypercholesteremia   . Hyperlipidemia     Patient Active Problem List   Diagnosis Date Noted  . Abnormal stress test 12/18/2019  . Post PTCA 12/18/2019  . Lung nodule 02/23/2013  . CAD (coronary artery disease) 01/16/2013  . Abnormal cholesterol test 01/16/2013    Past Surgical History:  Procedure Laterality Date  . BACK SURGERY    . BACK SURGERY    . CLUB FOOT RELEASE    . CORONARY ATHERECTOMY N/A 12/18/2019   Procedure: CORONARY ATHERECTOMY;  Surgeon: Nigel Mormon, MD;  Location: [place] [facility];  Service: Cardiovascular;  Laterality: N/A;  . CORONARY STENT INTERVENTION  12/18/2019  . CORONARY STENT INTERVENTION N/A 12/18/2019   Procedure: CORONARY STENT INTERVENTION;  Surgeon: Nigel Mormon, MD;  Location: [place] [facility];  Service: Cardiovascular;  Laterality: N/A;  .  INTRAVASCULAR ULTRASOUND/IVUS N/A 12/18/2019   Procedure: Intravascular Ultrasound/IVUS;  Surgeon: Nigel Mormon, MD;  Location: [place] [facility];  Service: Cardiovascular;  Laterality: N/A;  . LEFT HEART CATH AND CORONARY ANGIOGRAPHY N/A 12/18/2019   Procedure: LEFT HEART CATH AND CORONARY ANGIOGRAPHY;  Surgeon: Nigel Mormon, MD;  Location: [place] [facility];  Service: Cardiovascular;  Laterality: N/A;  . SHUNT REPLACEMENT     at 29 months of age       Family History  Problem Relation Age of Onset  . Allergies Sister   . Heart attack Mother   . Arthritis Mother   . Lung cancer Mother   . Arthritis Sister   . Alzheimer's disease Father   . Cancer Sister   . Heart attack Brother   . Hyperlipidemia Brother     Social History   Tobacco Use  . Smoking status: Never Smoker  . Smokeless tobacco: Never Used  Vaping Use  . Vaping Use: Never used  Substance Use Topics  . Alcohol use: Not Currently  . Drug use: No    Home Medications Prior to Admission medications   Medication Sig Start Date End Date Taking? Authorizing Provider  amLODipine (NORVASC) 5 MG tablet TAKE 1 TABLET (5 MG TOTAL) BY MOUTH DAILY. 03/13/20 06/11/20  [name], MD  aspirin EC 81 MG tablet Take 81 mg by mouth daily.    [provider]  clopidogrel (PLAVIX) 75 MG tablet Take 1 tablet (75 mg total) by mouth daily. 12/18/19 12/17/20  Patwardhan, Reynold Bowen, MD  metoprolol succinate (TOPROL-XL) 50 MG 24 hr tablet TAKE 1 TABLET BY MOUTH DAILY. TAKE WITH OR IMMEDIATELY FOLLOWING A MEAL. 02/15/20   [name], MD  nitroGLYCERIN (NITROSTAT) 0.4 MG SL tablet Place 1 tablet (0.4 mg total) under the tongue every 5 (five) minutes as needed for up to 25 days for chest pain. 11/16/19 12/12/19  [name], MD  pantoprazole (PROTONIX) 40 MG tablet Take 1 tablet (40 mg total) by mouth daily. 04/10/20 04/10/21  Patwardhan, Reynold Bowen, MD  rosuvastatin (CRESTOR) 20 MG tablet TAKE 1 TABLET (20 MG TOTAL) BY MOUTH DAILY.  02/20/20 05/20/20  Miquel Dunn, NP  traMADol (ULTRAM) 50 MG tablet Take 75 mg by mouth in the morning and at bedtime. For back pain     [provider]    Allergies    Patient has no known allergies.  Review of Systems   Review of Systems  Physical Exam Updated Vital Signs BP (!) 67/48   Pulse 67   Temp 98.1 F (36.7 C) (Oral)   Resp (!) 34   Ht 5\' 7"  (1.702 m)   Wt 68.9 kg   SpO2 96%   BMI 23.81 kg/m   Physical Exam  ED Results / Procedures / Treatments   Labs (all labs ordered are listed, but only abnormal results are displayed) Labs Reviewed  BASIC METABOLIC PANEL - Abnormal; Notable for the following components:      Result Value   Sodium 132 (*)    Glucose, Bld 123 (*)    All other components within normal limits  CBC - Abnormal; Notable for the following components:   Hemoglobin 12.8 (*)    All other components within normal limits  TROPONIN I (HIGH SENSITIVITY) - Abnormal; Notable for the following components:   Troponin I (High Sensitivity) 37 (*)    All other components within normal limits  SARS CORONAVIRUS 2 BY RT PCR (HOSPITAL ORDER, [place] [facility])  HEPARIN LEVEL (UNFRACTIONATED)  CBC  TROPONIN I (HIGH SENSITIVITY)    EKG EKG Interpretation  Date/Time:  Saturday April 19 2020 20:55:41 EDT Ventricular Rate:  55 PR Interval:    QRS Duration: 89 QT Interval:  398 QTC Calculation: 381 R Axis:   74 Text Interpretation: Sinus rhythm Anteroseptal infarct, age indeterminate ST elevation, consider inferior injury Baseline wander in lead(s) V3 When compared to prior, new t wave inversions in lead V2 and some depressions in V4. No STEMI Confirmed by Antony Blackbird [phone]) on 04/19/2020 9:02:17 PM   EKG Interpretation  Date/Time:  Saturday April 19 2020 20:55:41 EDT Ventricular Rate:  55 PR Interval:    QRS Duration: 89 QT Interval:  398 QTC Calculation: 381 R Axis:   74 Text Interpretation: Sinus rhythm  Anteroseptal infarct, age indeterminate ST elevation, consider inferior injury Baseline wander in lead(s) V3 When compared to prior, new t wave inversions in lead V2 and some depressions in V4. No STEMI Confirmed by Antony Blackbird [phone]) on 04/19/2020 9:02:17 PM        Radiology DG Chest 2 View  Result Date: 04/19/2020 CLINICAL DATA:  Chest pain EXAM: CHEST - 2 VIEW COMPARISON:  Radiograph 03/07/2014, CT 08/30/2014 FINDINGS: Chronically coarsened interstitial and bronchitic features are similar to comparison exams. Mild hyperinflation  and flattening of the diaphragms. No consolidation, features of edema, pneumothorax, or effusion. The cardiomediastinal contours are unremarkable. No acute osseous or soft tissue abnormality. Telemetry leads overlie the chest. IMPRESSION: 1. No acute cardiopulmonary disease. 2. Chronically coarsened interstitial and bronchitic features with hyperinflation. Electronically Signed   By: Lovena Le M.D.   On: 04/19/2020 21:45    Procedures Procedures (including critical care time)  CRITICAL CARE Performed by: Rodney Booze   Total critical care time: 40 minutes  Critical care time was exclusive of separately billable procedures and treating other patients.  Critical care was necessary to treat or prevent imminent or life-threatening deterioration.  Critical care was time spent personally by me on the following activities: development of treatment plan with patient and/or surrogate as well as nursing, discussions with consultants, evaluation of patient's response to treatment, examination of patient, obtaining history from patient or surrogate, ordering and performing treatments and interventions, ordering and review of laboratory studies, ordering and review of radiographic studies, pulse oximetry and re-evaluation of patient's condition.   Medications Ordered in ED Medications  nitroGLYCERIN 50 mg in dextrose 5 % 250 mL (0.2 mg/mL) infusion (0 mcg/min  Intravenous Hold 04/19/20 2231)  heparin ADULT infusion 100 units/mL (25000 units/262mL sodium chloride 0.45%) (850 Units/hr Intravenous New Bag/Given 04/19/20 2230)  DOPamine (INTROPIN) 800 mg in dextrose 5 % 250 mL (3.2 mg/mL) infusion (0 mcg/kg/min  68.9 kg Intravenous Stopped 04/19/20 2353)  sodium chloride flush (NS) 0.9 % injection 3 mL ( Intravenous Automatically Held 05/05/20 2200)  lidocaine (PF) (XYLOCAINE) 1 % injection (2 mLs Infiltration Given 04/19/20 2338)  Heparin (Porcine) in NaCl 1000-0.9 UT/500ML-% SOLN (500 mLs  Given 04/19/20 2340)  norepinephrine (LEVOPHED) 4 mg in dextrose 5 % 250 mL (0.016 mg/mL) infusion (5 mcg/min Intravenous Rate/Dose Change 04/19/20 2352)  Radial Cocktail/Verapamil only (10 mLs Intra-arterial Given 04/19/20 2344)  heparin sodium (porcine) injection (8,000 Units Intravenous Given 04/19/20 2349)  tirofiban (AGGRASTAT) infusion 50 mcg/mL 100 mL (0.15 mcg/kg/min  68.9 kg Intravenous New Bag/Given 04/20/20 0006)  tirofiban (AGGRASTAT) bolus via infusion (1,722.5 mcg Intravenous Given 04/20/20 0002)  sodium chloride 0.9 % bolus 1,000 mL (0 mLs Intravenous Stopped 04/19/20 2222)  fentaNYL (SUBLIMAZE) injection 50 mcg (50 mcg Intravenous Given 04/19/20 2134)  sodium chloride 0.9 % bolus 1,000 mL (0 mLs Intravenous Stopped 04/19/20 2257)  heparin bolus via infusion 3,500 Units (3,500 Units Intravenous Bolus from Bag 04/19/20 2231)  atropine 1 MG/10ML injection 1 mg (1 mg Intravenous Given 04/19/20 2255)  sodium chloride 0.9 % bolus 1,000 mL (1,000 mLs Intravenous New Bag/Given 04/19/20 2258)  ondansetron (ZOFRAN) injection 4 mg (4 mg Intravenous Given 04/19/20 2302)    ED Course  I have reviewed the triage vital signs and the nursing notes.  Pertinent labs & imaging results that were available during my care of the patient were reviewed by me and considered in my medical decision making (see chart for details).    MDM Rules/Calculators/A&P                           54 year old male presenting for evaluation of chest pain starting at 2 PM today.  Pain radiates to the jaw.  Recently had 2 stents placed in February 2021.  States has been noncompliant with his Plavix.  CBC is without leukocytosis, mild anemia present which is at baseline BMP nonacute Initial troponin is elevated at 37  EKG with Sinus rhythm Anteroseptal infarct, age indeterminate  ST elevation, consider inferior injury Baseline wander in lead(s) V3 When compared to prior, new t wave inversions in lead V2 and some depressions in V4. No STEMI   CXR  1. No acute cardiopulmonary disease. 2. Chronically coarsened interstitial and bronchitic features with hyperinflation.  CONSULT with Dr. Einar Gip of cardiology who reviewed EKGs. Recommends heparin and ntg drip. States to contact him if pts pain persists and he will consider taking pt to cath lab tonight.   Rechecked pt immediately following consult. He is still c/o 10/10 chest pain after receiving fentanyl 1 hour ago. HR now in the 40s and BP low at 99 systolic. Additional IVF initiated and ntg drip held at this time. Repaged Dr. Einar Gip given patient's clinical status appears to be declining, he will see pt and likely take to cath lab.   10:50 PM HR in 30s, repeat ekg shows junctional rhythm. Pads place. Dr Einar Gip made aware and is now at bedside. He will take to cath lab   Final Clinical Impression(s) / ED Diagnoses Final diagnoses:  Unstable angina [facility])    Rx / DC Orders ED Discharge Orders    None       Bishop Dublin 04/20/20 0010    Tegeler, Gwenyth Allegra, MD 04/21/20 [phone]

## 2020-04-20 ENCOUNTER — Other Ambulatory Visit: Payer: Self-pay

## 2020-04-20 ENCOUNTER — Inpatient Hospital Stay (HOSPITAL_COMMUNITY): Payer: Medicare Other

## 2020-04-20 DIAGNOSIS — I25110 Atherosclerotic heart disease of native coronary artery with unstable angina pectoris: Secondary | ICD-10-CM | POA: Diagnosis not present

## 2020-04-20 DIAGNOSIS — I5021 Acute systolic (congestive) heart failure: Secondary | ICD-10-CM | POA: Diagnosis not present

## 2020-04-20 DIAGNOSIS — E7800 Pure hypercholesterolemia, unspecified: Secondary | ICD-10-CM | POA: Diagnosis present

## 2020-04-20 DIAGNOSIS — I10 Essential (primary) hypertension: Secondary | ICD-10-CM | POA: Diagnosis present

## 2020-04-20 DIAGNOSIS — I214 Non-ST elevation (NSTEMI) myocardial infarction: Secondary | ICD-10-CM | POA: Diagnosis present

## 2020-04-20 DIAGNOSIS — Z20822 Contact with and (suspected) exposure to covid-19: Secondary | ICD-10-CM | POA: Diagnosis not present

## 2020-04-20 DIAGNOSIS — M549 Dorsalgia, unspecified: Secondary | ICD-10-CM | POA: Diagnosis present

## 2020-04-20 DIAGNOSIS — Z79899 Other long term (current) drug therapy: Secondary | ICD-10-CM | POA: Diagnosis not present

## 2020-04-20 DIAGNOSIS — Z9114 Patient's other noncompliance with medication regimen: Secondary | ICD-10-CM | POA: Diagnosis not present

## 2020-04-20 DIAGNOSIS — I200 Unstable angina: Secondary | ICD-10-CM | POA: Diagnosis present

## 2020-04-20 DIAGNOSIS — R45851 Suicidal ideations: Secondary | ICD-10-CM | POA: Diagnosis not present

## 2020-04-20 DIAGNOSIS — I2121 ST elevation (STEMI) myocardial infarction involving left circumflex coronary artery: Secondary | ICD-10-CM | POA: Diagnosis not present

## 2020-04-20 DIAGNOSIS — I2510 Atherosclerotic heart disease of native coronary artery without angina pectoris: Secondary | ICD-10-CM | POA: Diagnosis not present

## 2020-04-20 DIAGNOSIS — E782 Mixed hyperlipidemia: Secondary | ICD-10-CM | POA: Diagnosis present

## 2020-04-20 DIAGNOSIS — Z7901 Long term (current) use of anticoagulants: Secondary | ICD-10-CM | POA: Diagnosis not present

## 2020-04-20 DIAGNOSIS — Z9861 Coronary angioplasty status: Secondary | ICD-10-CM | POA: Diagnosis not present

## 2020-04-20 DIAGNOSIS — Z8249 Family history of ischemic heart disease and other diseases of the circulatory system: Secondary | ICD-10-CM | POA: Diagnosis not present

## 2020-04-20 DIAGNOSIS — Z7982 Long term (current) use of aspirin: Secondary | ICD-10-CM | POA: Diagnosis not present

## 2020-04-20 DIAGNOSIS — Z955 Presence of coronary angioplasty implant and graft: Secondary | ICD-10-CM | POA: Diagnosis not present

## 2020-04-20 DIAGNOSIS — F4325 Adjustment disorder with mixed disturbance of emotions and conduct: Secondary | ICD-10-CM | POA: Diagnosis present

## 2020-04-20 DIAGNOSIS — R570 Cardiogenic shock: Secondary | ICD-10-CM | POA: Diagnosis not present

## 2020-04-20 DIAGNOSIS — I472 Ventricular tachycardia: Secondary | ICD-10-CM | POA: Diagnosis not present

## 2020-04-20 LAB — ECHOCARDIOGRAM COMPLETE
Height: 67 in
Weight: 2455.04 oz

## 2020-04-20 LAB — BASIC METABOLIC PANEL
Anion gap: 7 (ref 5–15)
Anion gap: 8 (ref 5–15)
BUN: 15 mg/dL (ref 6–20)
BUN: 15 mg/dL (ref 6–20)
CO2: 22 mmol/L (ref 22–32)
CO2: 22 mmol/L (ref 22–32)
Calcium: 7.9 mg/dL — ABNORMAL LOW (ref 8.9–10.3)
Calcium: 8.3 mg/dL — ABNORMAL LOW (ref 8.9–10.3)
Chloride: 104 mmol/L (ref 98–111)
Chloride: 104 mmol/L (ref 98–111)
Creatinine, Ser: 0.97 mg/dL (ref 0.61–1.24)
Creatinine, Ser: 0.94 mg/dL (ref 0.61–1.24)
GFR calc Af Amer: >60 mL/min (ref >60)
GFR calc Af Amer: >60 mL/min (ref >60)
GFR calc non Af Amer: >60 mL/min (ref >60)
GFR calc non Af Amer: >60 mL/min (ref >60)
Glucose, Bld: 157 mg/dL — ABNORMAL HIGH (ref 70–99)
Glucose, Bld: 149 mg/dL — ABNORMAL HIGH (ref 70–99)
Potassium: 3.9 mmol/L (ref 3.5–5.1)
Potassium: 3.6 mmol/L (ref 3.5–5.1)
Sodium: 133 mmol/L — ABNORMAL LOW (ref 135–145)
Sodium: 134 mmol/L — ABNORMAL LOW (ref 135–145)

## 2020-04-20 LAB — CBC
HCT: 35.2 % — ABNORMAL LOW (ref 39.0–52.0)
Hemoglobin: 11.6 g/dL — ABNORMAL LOW (ref 13.0–17.0)
MCH: 29.1 pg (ref 26.0–34.0)
MCHC: 33 g/dL (ref 30.0–36.0)
MCV: 88.4 fL (ref 80.0–100.0)
Platelets: 282 10*3/uL (ref 150–400)
RBC: 3.98 MIL/uL — ABNORMAL LOW (ref 4.22–5.81)
RDW: 12.8 % (ref 11.5–15.5)
WBC: 11.5 10*3/uL — ABNORMAL HIGH (ref 4.0–10.5)
nRBC: 0 % (ref 0.0–0.2)

## 2020-04-20 LAB — MRSA PCR SCREENING: MRSA by PCR: NEGATIVE

## 2020-04-20 LAB — SARS CORONAVIRUS 2 BY RT PCR (HOSPITAL ORDER, PERFORMED IN ~~LOC~~ HOSPITAL LAB): SARS Coronavirus 2: NEGATIVE

## 2020-04-20 LAB — MAGNESIUM: Magnesium: 1.8 mg/dL (ref 1.7–2.4)

## 2020-04-20 LAB — BRAIN NATRIURETIC PEPTIDE: B Natriuretic Peptide: 378.8 pg/mL — ABNORMAL HIGH (ref 0.0–100.0)

## 2020-04-20 LAB — TROPONIN I (HIGH SENSITIVITY): Troponin I (High Sensitivity): >27000 ng/L (ref <18)

## 2020-04-20 LAB — HEPARIN LEVEL (UNFRACTIONATED): Heparin Unfractionated: 1.72 IU/mL — ABNORMAL HIGH (ref 0.30–0.70)

## 2020-04-20 MED ORDER — TIROFIBAN HCL IN NACL 5-0.9 MG/100ML-% IV SOLN
0.1500 ug/kg/min | INTRAVENOUS | Status: AC
  Administered 2020-04-20: 0.15 ug/kg/min via INTRAVENOUS
  Filled 2020-04-20: qty 100

## 2020-04-20 MED ORDER — METOPROLOL SUCCINATE ER 25 MG PO TB24
25.0000 mg | ORAL_TABLET | Freq: Every day | ORAL | Status: DC
  Administered 2020-04-20: 25 mg via ORAL
  Filled 2020-04-20: qty 1

## 2020-04-20 MED ORDER — HEPARIN SODIUM (PORCINE) 1000 UNIT/ML IJ SOLN
INTRAMUSCULAR | Status: AC
  Filled 2020-04-20: qty 1

## 2020-04-20 MED ORDER — ONDANSETRON HCL 4 MG/2ML IJ SOLN: 4.0000 mg | Freq: Four times a day (QID) | INTRAMUSCULAR | Status: DC | PRN

## 2020-04-20 MED ORDER — HYDRALAZINE HCL 20 MG/ML IJ SOLN: 5.0000 mg | INTRAMUSCULAR | Status: AC | PRN

## 2020-04-20 MED ORDER — IOHEXOL 350 MG/ML SOLN
INTRAVENOUS | Status: DC | PRN
  Administered 2020-04-20: 155 mL

## 2020-04-20 MED ORDER — TIROFIBAN (AGGRASTAT) BOLUS VIA INFUSION
INTRAVENOUS | Status: DC | PRN
  Administered 2020-04-20: 1722.5 ug via INTRAVENOUS

## 2020-04-20 MED ORDER — TIROFIBAN HCL IN NACL 5-0.9 MG/100ML-% IV SOLN
INTRAVENOUS | Status: AC | PRN
  Administered 2020-04-20: 0.15 ug/kg/min via INTRAVENOUS

## 2020-04-20 MED ORDER — METOPROLOL SUCCINATE ER 50 MG PO TB24: 50.0000 mg | ORAL_TABLET | Freq: Every day | ORAL | Status: DC

## 2020-04-20 MED ORDER — POTASSIUM CHLORIDE CRYS ER 20 MEQ PO TBCR
40.0000 meq | EXTENDED_RELEASE_TABLET | Freq: Every day | ORAL | Status: DC
  Administered 2020-04-20: 40 meq via ORAL
  Filled 2020-04-20: qty 2

## 2020-04-20 MED ORDER — BISACODYL 10 MG RE SUPP
10.0000 mg | Freq: Every day | RECTAL | Status: DC | PRN
  Administered 2020-04-20 – 2020-04-21 (×2): 10 mg via RECTAL
  Filled 2020-04-20 (×2): qty 1

## 2020-04-20 MED ORDER — NITROGLYCERIN 0.4 MG SL SUBL: 0.4000 mg | SUBLINGUAL_TABLET | SUBLINGUAL | Status: DC | PRN

## 2020-04-20 MED ORDER — ROSUVASTATIN CALCIUM 20 MG PO TABS
20.0000 mg | ORAL_TABLET | Freq: Every day | ORAL | Status: DC
  Administered 2020-04-20: 20 mg via ORAL
  Filled 2020-04-20: qty 1

## 2020-04-20 MED ORDER — METOPROLOL SUCCINATE ER 25 MG PO TB24
25.0000 mg | ORAL_TABLET | Freq: Once | ORAL | Status: AC
  Administered 2020-04-20: 25 mg via ORAL
  Filled 2020-04-20: qty 1

## 2020-04-20 MED ORDER — MAGNESIUM SULFATE 2 GM/50ML IV SOLN
2.0000 g | Freq: Once | INTRAVENOUS | Status: AC
  Administered 2020-04-20: 2 g via INTRAVENOUS
  Filled 2020-04-20: qty 50

## 2020-04-20 MED ORDER — PANTOPRAZOLE SODIUM 40 MG IV SOLR
40.0000 mg | Freq: Once | INTRAVENOUS | Status: AC
  Administered 2020-04-20: 40 mg via INTRAVENOUS
  Filled 2020-04-20: qty 40

## 2020-04-20 MED ORDER — SODIUM CHLORIDE 0.9 % WEIGHT BASED INFUSION
1.0000 mL/kg/h | INTRAVENOUS | Status: AC
  Administered 2020-04-20: 1 mL/kg/h via INTRAVENOUS

## 2020-04-20 MED ORDER — CHLORHEXIDINE GLUCONATE CLOTH 2 % EX PADS
6.0000 | MEDICATED_PAD | Freq: Every day | CUTANEOUS | Status: DC
  Administered 2020-04-20 – 2020-04-22 (×3): 6 via TOPICAL

## 2020-04-20 MED ORDER — ACETAMINOPHEN 325 MG PO TABS: 650.0000 mg | ORAL_TABLET | ORAL | Status: DC | PRN

## 2020-04-20 MED ORDER — TICAGRELOR 90 MG PO TABS
ORAL_TABLET | ORAL | Status: AC
  Filled 2020-04-20: qty 2

## 2020-04-20 MED ORDER — TRAMADOL HCL 50 MG PO TABS
75.0000 mg | ORAL_TABLET | Freq: Two times a day (BID) | ORAL | Status: DC | PRN
  Administered 2020-04-20 – 2020-04-22 (×3): 75 mg via ORAL
  Filled 2020-04-20 (×3): qty 2

## 2020-04-20 MED ORDER — TICAGRELOR 90 MG PO TABS
ORAL_TABLET | ORAL | Status: DC | PRN
  Administered 2020-04-20: 180 mg via ORAL

## 2020-04-20 MED ORDER — LIDOCAINE HCL (PF) 1 % IJ SOLN
INTRAMUSCULAR | Status: AC
  Filled 2020-04-20: qty 30

## 2020-04-20 MED ORDER — FUROSEMIDE 10 MG/ML IJ SOLN
20.0000 mg | Freq: Two times a day (BID) | INTRAMUSCULAR | Status: DC
  Administered 2020-04-20 (×2): 20 mg via INTRAVENOUS
  Filled 2020-04-20 (×2): qty 2

## 2020-04-20 MED ORDER — TICAGRELOR 90 MG PO TABS
ORAL_TABLET | ORAL | Status: AC
  Filled 2020-04-20: qty 1

## 2020-04-20 MED ORDER — SODIUM CHLORIDE 0.9% FLUSH: 3.0000 mL | INTRAVENOUS | Status: DC | PRN

## 2020-04-20 MED ORDER — SODIUM CHLORIDE 0.9% FLUSH
3.0000 mL | Freq: Two times a day (BID) | INTRAVENOUS | Status: DC
  Administered 2020-04-20 – 2020-04-21 (×3): 3 mL via INTRAVENOUS

## 2020-04-20 MED ORDER — ASPIRIN 81 MG PO CHEW
81.0000 mg | CHEWABLE_TABLET | Freq: Every day | ORAL | Status: DC
  Administered 2020-04-20 – 2020-04-22 (×3): 81 mg via ORAL
  Filled 2020-04-20 (×3): qty 1

## 2020-04-20 MED ORDER — POTASSIUM CHLORIDE CRYS ER 20 MEQ PO TBCR
40.0000 meq | EXTENDED_RELEASE_TABLET | Freq: Every day | ORAL | Status: DC
  Administered 2020-04-21: 40 meq via ORAL
  Filled 2020-04-20: qty 2

## 2020-04-20 MED ORDER — TICAGRELOR 90 MG PO TABS
90.0000 mg | ORAL_TABLET | Freq: Two times a day (BID) | ORAL | Status: DC
  Administered 2020-04-20 – 2020-04-22 (×5): 90 mg via ORAL
  Filled 2020-04-20 (×5): qty 1

## 2020-04-20 MED ORDER — PANTOPRAZOLE SODIUM 40 MG PO TBEC
40.0000 mg | DELAYED_RELEASE_TABLET | Freq: Every day | ORAL | Status: DC
  Administered 2020-04-20 – 2020-04-22 (×3): 40 mg via ORAL
  Filled 2020-04-20 (×3): qty 1

## 2020-04-20 MED ORDER — SODIUM CHLORIDE 0.9 % IV SOLN: 250.0000 mL | INTRAVENOUS | Status: DC | PRN

## 2020-04-20 MED ORDER — ROSUVASTATIN CALCIUM 20 MG PO TABS
40.0000 mg | ORAL_TABLET | Freq: Every day | ORAL | Status: DC
  Administered 2020-04-21: 40 mg via ORAL
  Filled 2020-04-20: qty 2

## 2020-04-20 NOTE — Progress Notes (Signed)
  Echocardiogram 2D Echocardiogram has been performed.  Merrie Roof F 04/20/2020, 3:36 PM

## 2020-04-20 NOTE — Progress Notes (Signed)
ANTICOAGULATION CONSULT NOTE   Pharmacy Consult for heparin Indication: chest pain/ACS  No Known Allergies  Patient Measurements: Height: 5\' 7"  (170.2 cm) Weight: 69.6 kg (153 lb 7 oz) IBW/kg (Calculated) : 66.1 Heparin Dosing Weight: 68 kg  Vital Signs: Temp: 98.1 F (36.7 C) (06/27 0725) Temp Source: Oral (06/27 0300) BP: 140/103 (06/27 0900) Pulse Rate: 98 (06/27 0900)  Labs: Recent Labs    04/19/20 2053 04/20/20 0344  HGB 12.8* 11.6*  HCT 39.0 35.2*  PLT 325 282  HEPARINUNFRC  --  1.72*  CREATININE 1.03 0.97  TROPONINIHS 37* >27,000*    Estimated Creatinine Clearance: 83.3 mL/min (by C-G formula based on SCr of 0.97 mg/dL).   Medical History: Past Medical History:  Diagnosis Date  . Back pain   . Coronary artery disease   . Hypercholesteremia   . Hyperlipidemia     Medications: See med history  Assessment: 54 yo man with chest pain to start heparin.  He was not on anticoagulation PTA  Pt now s/p cath lab.  Heparin level from early this AM elevated, likely from heparin boluses in cath procedure.  Heparin gtt now turned off.  Goal of Therapy:  Heparin level 0.3-0.7 units/ml Monitor platelets by anticoagulation protocol: Yes   Plan:  Heparin not resumed s/p cath. Finishing tirofiban infusion at 1300 today.  [place] Crane, Roylene Reason, BCCP Clinical Pharmacist  04/20/2020 10:18 AM   [facility] pharmacy phone numbers are listed on [email or web address]

## 2020-04-20 NOTE — Progress Notes (Signed)
Progress Note  Patient Name: Cody Preston Date of Encounter: 04/20/2020  Attending physician: [name], MD Primary care provider: Iona Beard, MD Primary Cardiologist:  Consultant:Demarea Lorey Terri Skains, DO  Subjective: Cody Preston is a 54 y.o. male who was seen and examined at bedside at approximately 1045am No events overnight. Patient denies any chest pain at rest or with effort related activities, no shortness of breath at rest or with effort related activities, no orthopnea, paroxysmal nocturnal dyspnea or lower extremity swelling. Patient wanting to go home.  Episodes of NSVT over night on telemetry.  Case discussed and reviewed with his nurse.  Objective: Vital Signs in the last 24 hours: Temp:  [98.1 F (36.7 C)-98.6 F (37 C)] 98.1 F (36.7 C) (06/27 0725) Pulse Rate:  [38-246] 98 (06/27 0900) Resp:  [9-34] 24 (06/27 0900) BP: (67-141)/(43-103) 140/103 (06/27 0900) SpO2:  [0 %-100 %] 96 % (06/27 0900) Weight:  [68.9 kg-69.6 kg] 69.6 kg (06/27 0300)  Intake/Output:  Intake/Output Summary (Last 24 hours) at 04/20/2020 1052 Last data filed at 04/20/2020 0900 Gross per 24 hour  Intake 2512.07 ml  Output 280 ml  Net 2232.07 ml    Net IO Since Admission: 2,232.07 mL [04/20/20 1052]  Weights:  Filed Weights   04/19/20 2051 04/20/20 0300  Weight: 68.9 kg 69.6 kg    Telemetry: Personally reviewed, PVCs, and NSVT  Physical examination: PHYSICAL EXAM: Vitals with BMI 04/20/2020 04/20/2020 04/20/2020  Height - - -  Weight - - -  BMI - - -  Systolic 315 400 867  Diastolic 619 95 74  Pulse 98 98 103   CONSTITUTIONAL: Well-developed and well-nourished. No acute distress.  SKIN: Skin is warm and dry. No rash noted. No cyanosis. No pallor. No jaundice HEAD: Normocephalic and atraumatic.  EYES: No scleral icterus MOUTH/THROAT: Moist oral membranes.   NECK: No JVD present. No thyromegaly noted. Cyst noted on the left lateral neck. LYMPHATIC: No visible cervical adenopathy.   CHEST Normal respiratory effort. No intercostal retractions  LUNGS: Clear to auscultation bilaterally.  No stridor. No wheezes. No rales.  CARDIOVASCULAR: Regular, positive S1-S2, no murmurs rubs or gallops appreciated. ABDOMINAL: No apparent ascites.  EXTREMITIES: No peripheral edema.  Right radial artery hemostasis achieved. HEMATOLOGIC: No significant bruising NEUROLOGIC: Oriented to person, place, and time. Nonfocal. Normal muscle tone.  PSYCHIATRIC: Normal mood and affect. Normal behavior. Cooperative  Lab Results: Hematology Recent Labs  Lab 04/19/20 2053 04/20/20 0344  WBC 9.3 11.5*  RBC 4.40 3.98*  HGB 12.8* 11.6*  HCT 39.0 35.2*  MCV 88.6 88.4  MCH 29.1 29.1  MCHC 32.8 33.0  RDW 12.5 12.8  PLT 325 282    Chemistry Recent Labs  Lab 04/19/20 2053 04/20/20 0344  NA 132* 133*  K 4.2 3.9  CL 98 104  CO2 24 22  GLUCOSE 123* 157*  BUN 16 15  CREATININE 1.03 0.97  CALCIUM 9.2 7.9*  GFRNONAA >60 >60  GFRAA >60 >60  ANIONGAP 10 7     Cardiac Enzymes: Cardiac Panel (last 3 results) Recent Labs    04/19/20 2053 04/20/20 0344  TROPONINIHS 37* >27,000*    BNP (last 3 results) No results for input(s): BNP in the last 8760 hours.  ProBNP (last 3 results) No results for input(s): PROBNP in the last 8760 hours.   DDimer No results for input(s): DDIMER in the last 168 hours.   Hemoglobin A1c: No results found for: HGBA1C, MPG  TSH No results for input(s): TSH in the  last 8760 hours.  Lipid Panel No results found for: CHOL, TRIG, HDL, CHOLHDL, VLDL, LDLCALC, LDLDIRECT  Imaging: DG Chest 2 View  Result Date: 04/19/2020 CLINICAL DATA:  Chest pain EXAM: CHEST - 2 VIEW COMPARISON:  Radiograph 03/07/2014, CT 08/30/2014 FINDINGS: Chronically coarsened interstitial and bronchitic features are similar to comparison exams. Mild hyperinflation and flattening of the diaphragms. No consolidation, features of edema, pneumothorax, or effusion. The cardiomediastinal  contours are unremarkable. No acute osseous or soft tissue abnormality. Telemetry leads overlie the chest. IMPRESSION: 1. No acute cardiopulmonary disease. 2. Chronically coarsened interstitial and bronchitic features with hyperinflation. Electronically Signed   By: Lovena Le M.D.   On: 04/19/2020 21:45   CARDIAC CATHETERIZATION  Result Date: 04/20/2020 Coronary angiogram and angioplasty 04/19/2020: LV: Moderate to severely elevated LVEDP of 25 mmHg.  No pressure gradient across the aortic valve.  LVEF 50% without wall motion abnormality in the RAO projection.  No significant MR. RCA: Large caliber vessel, widely patent.  Mild disease. Left main: Mildly calcified but no significant disease. LAD: Large caliber vessel.  Previously placed stent in the proximal LAD in February 2021 is widely patent.  D1 is occluded and that is chronic.  Faint ipsilateral collaterals are evident.  A moderate-sized D2 is widely patent. Circumflex: Occluded in the proximal segment.  Mild to moderate amount of calcification evident.  SP thrombectomy with Pronto V4 catheter followed by balloon angioplasty with a 2.5 x 12 mm sapphire balloon.  In spite of utilization of multiple guidewires including cougar XT, mailman, and also utilization of guide liner for support.  Suspect significant amount of calcification, leading to difficulty in delivering the stents.  A 3.5 x 22 mm resolute Onyx and a 3.5 x 18 mm Orsero stents were attempted to cross. TIMI-3 flow was established with minimal residual stenosis of 5 to 10%. Recommendation: Patient will be started on aspirin, indefinitely and Brilinta for at least a year.  A 4-year.  Could consider repeat coronary angiography in 4 to 6 weeks for evaluation for restenosis, and consider arthrectomy followed by stenting. If remains stable, can be discharged in 24 to 48 hours.  155 mL contrast utilized.  Extremely difficult procedure.   Cardiac database: EKG: EKG 04/19/2020: Marked sinus  bradycardia, hyperacute T abnormalities inferior leads with ST depression in V1 to V4, does not meet criteria for ischemia or acute ST elevation MI but very suspicious for ACS.  Echocardiogram: 11/22/2019:  Left ventricle cavity is normal in size and function. Normal global wall motion. Calculated EF 47%. Visual LVEF 55-60% Left atrial cavity is mildly dilated by volume.  Structurally normal tricuspid valve. Mild tricuspid regurgitation. No evidence of pulmonary hypertension.   Stress test: Lexiscan Tetrofosmin Stress Test 11/19/2019: Abnormal ECG stress. Intravenous Lexiscan protocol. Resting EKG/ECG demonstrated normal sinus rhythm. Peak EKG/ECG revealed 1 mm horizontal ST depression of the inferolateral leads. During infusion the patient developed no chest pain, and exhibited arm pain, dizziness, jaw pain, & headache. Large extent perfusion defect consistent with severe ischemia in the mid to distal anterior and anterolateral wall in LAD distribution.  Gated SPECT imaging of the left ventricle demonstrating akinesis in the same region. Stress LVEF moderately depressed at 37%.  No previous exam available for comparison. High risk study.   Heart catheterization: Coronary angiogram 12/18/2019: Left main normal, proximal and mid calcified 95 and 75% stenosis, moderate disease in left circumflex stent.   SP orbital atherectomy followed by stenting 3.0x26 mm and a 3.0 x 18 mm resolute Onyx  DES, proximal stent placed dilated to 4.0 x 8 mm Knox balloon.  Coronary angiogram and angioplasty 04/19/2020: LV: Moderate to severely elevated LVEDP of 25 mmHg.  No pressure gradient across the aortic valve.  LVEF 50% without wall motion abnormality in the RAO projection.  No significant MR. RCA: Large caliber vessel, widely patent.  Mild disease. Left main: Mildly calcified but no significant disease. LAD: Large caliber vessel.  Previously placed stent in the proximal LAD in February 2021 is widely patent.   D1 is occluded and that is chronic.  Faint ipsilateral collaterals are evident.  A moderate-sized D2 is widely patent. Circumflex: Occluded in the proximal segment.  Mild to moderate amount of calcification evident.  SP thrombectomy with Pronto V4 catheter followed by balloon angioplasty with a 2.5 x 12 mm sapphire balloon.  In spite of utilization of multiple guidewires including cougar XT, mailman, and also utilization of guide liner for support.  Suspect significant amount of calcification, leading to difficulty in delivering the stents.  A 3.5 x 22 mm resolute Onyx and a 3.5 x 18 mm Orsero stents were attempted to cross.  TIMI-3 flow was established with minimal residual stenosis of 5 to 10%.  Recommendation: Patient will be started on aspirin, indefinitely and Brilinta for at least a year.  A 4-year.  Could consider repeat coronary angiography in 4 to 6 weeks for evaluation for restenosis, and consider arthrectomy followed by stenting.  Scheduled Meds:  aspirin  81 mg Oral Daily   Chlorhexidine Gluconate Cloth  6 each Topical Daily   metoprolol succinate  25 mg Oral Daily   pantoprazole  40 mg Oral Daily   rosuvastatin  20 mg Oral Daily   sodium chloride flush  3 mL Intravenous Q12H   sodium chloride flush  3 mL Intravenous Q12H   ticagrelor  90 mg Oral BID    Continuous Infusions:  sodium chloride     sodium chloride 1 mL/kg/hr (04/20/20 0414)   DOPamine Stopped (04/19/20 2353)   nitroGLYCERIN Stopped (04/19/20 2231)   tirofiban 0.15 mcg/kg/min (04/20/20 0900)    PRN Meds: sodium chloride, acetaminophen, bisacodyl, nitroGLYCERIN, ondansetron (ZOFRAN) IV, sodium chloride flush, traMADol   IMPRESSION & RECOMMENDATIONS: Cody Preston is a 54 y.o. 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   Non-STEMI:  Patient presented to the hospital with typical chest pain, dynamic EKG changes, and elevated cardiac biomarkers and was ruled in for non-STEMI.  Due to ongoing chest pain he was taken to the Cath Lab emergently for left heart catheterization with possible intervention.  Please refer to the catheterization findings as noted above.  Currently off of dopamine and Levophed.  Patient will finish tirofiban later today  Dual antiplatelet therapy for at least 1 year.  Patient is emphasized on the importance of medication compliance.  Increase Toprol-XL to 50 mg p.o. daily.  Initiate Lasix 20 mg IV push, patient is currently in positive fluid balance and also had elevated LVEDP.  Strict I's and O's and daily weights.  We will continue to uptitrate guideline directed medical therapy as hemodynamics and laboratory values allow.  Increase rosuvastatin to 40 mg p.o. nightly.  Echocardiogram will be ordered to evaluate for structural heart  disease and left ventricular systolic function.  Check morning BMP, BNP, magnesium level.  Educated on the importance of improving modifiable cardiovascular risk factors.  Recommend cardiac rehab as outpatient given the recent non-STEMI and angioplasty.  Established coronary artery disease with prior PCI's presented with unstable angina: Currently asymptomatic and status post angioplasty proximal LCx.  See above  Nonsustained ventricular tachycardia:  Patient is asymptomatic, most likely secondary to reperfusion.  Increase beta-blocker therapy.  Check magnesium level.   We will administer 2 g IV magnesium sulfate.  Checking labs.  Echocardiogram will be ordered to evaluate for structural heart disease and left ventricular systolic function.  Secondary diagnoses: Mixed hyperlipidemia: Increase Crestor to  40 mg p.o. nightly given the recent non-STEMI.  Fasting lipid profile. Family history of premature coronary artery disease: See above  Plan of care also discussed with the patient's nurse April.  We will arrange a TOC visit with Dr. Einar Gip in 7 days post discharge.  Patient's questions and concerns were addressed to his satisfaction. He voices understanding of the instructions provided during this encounter.   This note was created using a voice recognition software as a result there may be grammatical errors inadvertently enclosed that do not reflect the nature of this encounter. Every attempt is made to correct such errors.  Rex Kras, DO, [facility]. [place] Office: [phone] 04/20/2020, 10:52 AM

## 2020-04-21 ENCOUNTER — Encounter (HOSPITAL_COMMUNITY): Payer: Self-pay | Admitting: Cardiology

## 2020-04-21 DIAGNOSIS — F4325 Adjustment disorder with mixed disturbance of emotions and conduct: Secondary | ICD-10-CM | POA: Diagnosis present

## 2020-04-21 LAB — POCT ACTIVATED CLOTTING TIME
Activated Clotting Time: 268 seconds
Activated Clotting Time: 318 seconds
Activated Clotting Time: 362 seconds

## 2020-04-21 LAB — LIPID PANEL
Cholesterol: 128 mg/dL (ref 0–200)
HDL: 62 mg/dL (ref >40)
LDL Cholesterol: 52 mg/dL (ref 0–99)
Total CHOL/HDL Ratio: 2.1 RATIO
Triglycerides: 69 mg/dL (ref <150)
VLDL: 14 mg/dL (ref 0–40)

## 2020-04-21 LAB — CBC
HCT: 36.2 % — ABNORMAL LOW (ref 39.0–52.0)
Hemoglobin: 11.8 g/dL — ABNORMAL LOW (ref 13.0–17.0)
MCH: 28.9 pg (ref 26.0–34.0)
MCHC: 32.6 g/dL (ref 30.0–36.0)
MCV: 88.5 fL (ref 80.0–100.0)
Platelets: 281 10*3/uL (ref 150–400)
RBC: 4.09 MIL/uL — ABNORMAL LOW (ref 4.22–5.81)
RDW: 13.1 % (ref 11.5–15.5)
WBC: 12 10*3/uL — ABNORMAL HIGH (ref 4.0–10.5)
nRBC: 0 % (ref 0.0–0.2)

## 2020-04-21 LAB — HEMOGLOBIN A1C
Hgb A1c MFr Bld: 5.9 % — ABNORMAL HIGH (ref 4.8–5.6)
Mean Plasma Glucose: 122.63 mg/dL

## 2020-04-21 LAB — POTASSIUM: Potassium: 4.2 mmol/L (ref 3.5–5.1)

## 2020-04-21 MED ORDER — ACETAMINOPHEN 325 MG PO TABS: 650.0000 mg | ORAL_TABLET | Freq: Two times a day (BID) | ORAL | 2 refills | Status: DC | PRN

## 2020-04-21 MED ORDER — FUROSEMIDE 20 MG PO TABS: 20.0000 mg | ORAL_TABLET | Freq: Every day | ORAL | 2 refills | Status: DC | PRN

## 2020-04-21 MED ORDER — ASPIRIN 81 MG PO CHEW: 81.0000 mg | CHEWABLE_TABLET | Freq: Every day | ORAL | 2 refills | Status: AC

## 2020-04-21 MED ORDER — POTASSIUM CHLORIDE CRYS ER 20 MEQ PO TBCR
20.0000 meq | EXTENDED_RELEASE_TABLET | Freq: Every day | ORAL | Status: DC
  Administered 2020-04-21 – 2020-04-22 (×2): 20 meq via ORAL
  Filled 2020-04-21 (×2): qty 1

## 2020-04-21 MED ORDER — NITROGLYCERIN 0.4 MG SL SUBL: 0.4000 mg | SUBLINGUAL_TABLET | SUBLINGUAL | 2 refills | Status: DC | PRN

## 2020-04-21 MED ORDER — FUROSEMIDE 10 MG/ML IJ SOLN
40.0000 mg | Freq: Once | INTRAMUSCULAR | Status: AC
  Administered 2020-04-21: 40 mg via INTRAVENOUS
  Filled 2020-04-21: qty 4

## 2020-04-21 MED ORDER — METOPROLOL SUCCINATE ER 25 MG PO TB24
25.0000 mg | ORAL_TABLET | Freq: Every day | ORAL | Status: DC
  Administered 2020-04-21: 25 mg via ORAL
  Filled 2020-04-21: qty 1

## 2020-04-21 MED ORDER — TICAGRELOR 90 MG PO TABS: 90.0000 mg | ORAL_TABLET | Freq: Two times a day (BID) | ORAL | 2 refills | Status: DC

## 2020-04-21 MED ORDER — LISINOPRIL 5 MG PO TABS
5.0000 mg | ORAL_TABLET | Freq: Every day | ORAL | Status: DC
  Administered 2020-04-21 – 2020-04-22 (×2): 5 mg via ORAL
  Filled 2020-04-21 (×2): qty 1

## 2020-04-21 MED ORDER — LISINOPRIL 5 MG PO TABS: 5.0000 mg | ORAL_TABLET | Freq: Every day | ORAL | 2 refills | Status: DC

## 2020-04-21 MED ORDER — METOPROLOL SUCCINATE ER 25 MG PO TB24: 25.0000 mg | ORAL_TABLET | Freq: Every day | ORAL | Status: DC

## 2020-04-21 MED ORDER — FUROSEMIDE 20 MG PO TABS
20.0000 mg | ORAL_TABLET | Freq: Every day | ORAL | Status: DC
  Administered 2020-04-22: 20 mg via ORAL
  Filled 2020-04-21: qty 1

## 2020-04-21 MED ORDER — METOPROLOL SUCCINATE ER 25 MG PO TB24: 25.0000 mg | ORAL_TABLET | Freq: Every day | ORAL | 2 refills | Status: DC

## 2020-04-21 MED FILL — Lidocaine HCl Local Preservative Free (PF) Inj 1%: INTRAMUSCULAR | Qty: 30 | Status: AC

## 2020-04-21 MED FILL — Nitroglycerin IV Soln 100 MCG/ML in D5W: INTRA_ARTERIAL | Qty: 10 | Status: AC

## 2020-04-21 NOTE — Progress Notes (Addendum)
Subjective:  Feels better this morning.  No chest pain. Breathing improved.  Has not ambulated yet.  Expressed suicidal ideation last night.  Refer to RN notes 6.28 2:04 AM  Few episodes of 5-6 beat NSVT on telemetry  Objective:  Vital Signs in the last 24 hours: Temp:  [98.6 F (37 C)-99.5 F (37.5 C)] 99.4 F (37.4 C) (06/28 0700) Pulse Rate:  [68-103] 98 (06/28 0700) Resp:  [14-28] 14 (06/28 0700) BP: (89-140)/(57-103) 105/85 (06/28 0700) SpO2:  [91 %-100 %] 97 % (06/28 0700)  Intake/Output from previous day: 06/27 0701 - 06/28 0700 In: 862.8 [P.O.:710; I.V.:102.8; IV Piggyback:50] Out: 3300 [Urine:3300]  Physical Exam Vitals and nursing note reviewed.  Constitutional:      General: He is not in acute distress.    Appearance: He is well-developed.  HENT:     Head: Normocephalic and atraumatic.  Eyes:     Conjunctiva/sclera: Conjunctivae normal.     Pupils: Pupils are equal, round, and reactive to light.  Neck:     Vascular: No JVD.  Cardiovascular:     Rate and Rhythm: Normal rate and regular rhythm.     Pulses: Intact distal pulses.          Dorsalis pedis pulses are 0 on the right side.       Posterior tibial pulses are 0 on the right side and 1+ on the left side.     Heart sounds: No murmur heard.      Comments: Both extremities cool to touch. Pulmonary:     Effort: Pulmonary effort is normal.     Breath sounds: Rales (Right basal) present. No wheezing.  Abdominal:     General: Bowel sounds are normal.     Palpations: Abdomen is soft.     Tenderness: There is no rebound.  Musculoskeletal:        General: No tenderness. Normal range of motion.     Left lower leg: No edema.  Lymphadenopathy:     Cervical: No cervical adenopathy.  Skin:    General: Skin is warm and dry.  Neurological:     Mental Status: He is alert and oriented to person, place, and time.     Cranial Nerves: No cranial nerve deficit.      Lab Results: BMP Recent Labs     04/19/20 2053 04/20/20 0344 04/20/20 1058  NA 132* 133* 134*  K 4.2 3.9 3.6  CL 98 104 104  CO2 24 22 22   GLUCOSE 123* 157* 149*  BUN 16 15 15   CREATININE 1.03 0.97 0.94  CALCIUM 9.2 7.9* 8.3*  GFRNONAA >60 >60 >60  GFRAA >60 >60 >60    CBC Recent Labs  Lab 04/21/20 0320  WBC 12.0*  RBC 4.09*  HGB 11.8*  HCT 36.2*  PLT 281  MCV 88.5  MCH 28.9  MCHC 32.6  RDW 13.1    HEMOGLOBIN A1C Lab Results  Component Value Date   HGBA1C 5.9 (H) 04/21/2020   MPG 122.63 04/21/2020    BNP (last 3 results) Recent Labs    04/20/20 1058  BNP 378.8*    Results for Cody Preston, Cody Preston (MRN [id number]) as of 04/21/2020 08:12  Ref. Range 04/19/2020 20:53 04/20/2020 03:44  Troponin I (High Sensitivity) Latest Ref Range: <18 ng/L 37 (H) >27,000 (HH)   Lipid Panel     Component Value Date/Time   CHOL 128 04/21/2020 0320   TRIG 69 04/21/2020 0320   HDL 62 04/21/2020 0320   CHOLHDL 2.1  04/21/2020 0320   VLDL 14 04/21/2020 0320   LDLCALC 52 04/21/2020 0320      Cardiac Studies:  EKG 04/20/2020: Sinus tachycardia 100 bpm.   Poor R wave progression.  Lateral infarct age indeterminate. Frequent PVCs  Coronary intervention 04/20/2020: Coronary angiogram and angioplasty 04/19/2020: LV: Moderate to severely elevated LVEDP of 25 mmHg.  No pressure gradient across the aortic valve.  LVEF 50% without wall motion abnormality in the RAO projection.  No significant MR. RCA: Large caliber vessel, widely patent.  Mild disease. Left main: Mildly calcified but no significant disease. LAD: Large caliber vessel.  Previously placed stent in the proximal LAD in February 2021 is widely patent.  D1 is occluded and that is chronic.  Faint ipsilateral collaterals are evident.  A moderate-sized D2 is widely patent. Circumflex: Occluded in the proximal segment.  Mild to moderate amount of calcification evident.  SP thrombectomy with Pronto V4 catheter followed by balloon angioplasty with a 2.5 x 12 mm sapphire  balloon.  In spite of utilization of multiple guidewires including cougar XT, mailman, and also utilization of guide liner for support.  Suspect significant amount of calcification, leading to difficulty in delivering the stents.  A 3.5 x 22 mm resolute Onyx and a 3.5 x 18 mm Orsero stents were attempted to cross.  TIMI-3 flow was established with minimal residual stenosis of 5 to 10%.  Recommendation: Patient will be started on aspirin, indefinitely and Brilinta for at least a year.  A 4-year.  Could consider repeat coronary angiography in 4 to 6 weeks for evaluation for restenosis, and consider arthrectomy followed by stenting.  If remains stable, can be discharged in 24 to 48 hours.  155 mL contrast utilized.  Extremely difficult procedure.   Echocardiogram 04/20/2020: 1. Left ventricular ejection fraction, by estimation, is 40 to 45%. The  left ventricle has mildly decreased function. The left ventricle  demonstrates regional wall motion abnormalities (see scoring  diagram/findings for description). Left ventricular  diastolic parameters are indeterminate. There is moderate hypokinesis of  the left ventricular, basal-mid inferior wall.  2. Right ventricular systolic function is normal. The right ventricular  size is normal. There is mildly elevated pulmonary artery systolic  pressure.  3. The mitral valve is grossly normal. Moderate to severe mitral valve  regurgitation.  4. Tricuspid valve regurgitation is moderate. RA-RV peak gradient 34  mmHg.  5. Compared to previous study on 11/20/2019, LVEF is lowered from 55-65%  to 40-45%, wall motion abnormalities are new, valvular regurgitation more  prominent.   EKG 04/19/2020: Junctional rhythm 35 bpm.  Anterolateral ST depression.   Assessment & Recommendations:  54 y/o Caucasian male with hypertension, hyperlipidemia, strong family h/o early CAD, admitted with acute coronary syndrome   ACS: Acute proximal left circumflex  occlusion.  HS Trop greater >27,000 successful PTCA.  Unable to deliver stent due to tortuosity and calcification. Chest pain-free.  May need repeat attempt at stent placement outpatient in 4-6 weeks. Continue dual antiplatelet therapy with aspirin and Brilinta at least till 03/2021. Continue metoprolol succinate, reduce dose to 25 mg daily.  Added lisinopril 5 mg daily. Continue Crestor 40 mg daily  Acute heart failure: Post MI.  BNP 378.  EF 40-45% with mid to basal inferior hypokinesis Right basal Rales on 628. IV Lasix 40 mg this morning.  We will then switch to p.o. Lasix 20 mg daily. Potassium supplement. Added lisinopril as above. Strict I/O 2 g sodium restriction diet. Candidate for EMPACT-MI clinical trial.  We will discuss during outpatient followup.  Suicidal ideation: Please refer to detailed note by RN on 6.28 2:04 AM. Patient with history of suicidal attempt several years ago.  States that his ideation yesterday was related to "being stir crazy in the hospital".  Denies any suicidal ideation this morning.  Nonetheless, will obtain inpatient psychiatry consult.  Patient has had history of being lost to follow-up in the past, therefore it is necessary to further evaluate him inpatient.  Transfer to telemetry today.  If no further psychiatry recommendations for inpatient care, will likely discharged home on 629.  CRITICAL CARE Performed by: Vernell Leep   Total critical care time: 35 minutes   Critical care time was exclusive of separately billable procedures and treating other patients.   Critical care was necessary to treat or prevent imminent or life-threatening deterioration.   Critical care was time spent personally by me on the following activities: development of treatment plan with patient and/or surrogate as well as nursing, discussions with consultants, evaluation of patient's response to treatment, examination of patient, obtaining history from patient or  surrogate, ordering and performing treatments and interventions, ordering and review of laboratory studies, ordering and review of radiographic studies, pulse oximetry and re-evaluation of patient's condition.    Nigel Mormon, M.D. [facility], [place] Pager: [phone] Office: [phone]

## 2020-04-21 NOTE — Progress Notes (Signed)
   04/21/20 1955  Vitals  Temp 97.8 F (36.6 C)  Temp Source Oral  BP 90/74  MAP (mmHg) 81  BP Location Right Arm  BP Method Automatic  Patient Position (if appropriate) Lying  Pulse Rate 77  Pulse Rate Source Monitor  Resp 18  Oxygen Therapy  SpO2 99 %  MEWS Score  MEWS Temp 0  MEWS Systolic 1  MEWS Pulse 0  MEWS RR 0  MEWS LOC 0  MEWS Score 1  MEWS Score Color Green  Admitted pt to rm 3E24 from [place], pt alert and oriented x 4, oriented to room, placed on cardiac monitor CCMD made aware. Pt on suicide precaution, sitter at bedside. On assessment pt currently denies suicidal ideations, contracts for safety. Will continue to provide and maintain therapeutic and safe environment.

## 2020-04-21 NOTE — Consult Note (Signed)
[facility] Consult   Reason for Consult:  Suicide comment Referring Physician:  Dr Virgina Jock  Patient Identification: Cody Preston MRN:  [id number] Principal Diagnosis: Non-ST elevation (NSTEMI) myocardial infarction [facility]) Diagnosis:  Principal Problem:   Non-ST elevation (NSTEMI) myocardial infarction [facility]) Active Problems:   Adjustment disorder with mixed disturbance of emotions and conduct   NSTEMI (non-ST elevated myocardial infarction) (Cody Preston)  Total Time spent with patient: 45 minutes  Subjective:   Cody Preston is a 54 y.o. male patient admitted with MI.  Client seen and evaluated in person by this provider.  Smiling throughout the assessment, odd at times.  Reports he was in a work accident and broke his back.  Denies depression, suicidal/homicidal ideations, hallucinations, substance abuse, or other psychiatric concerns.  He stated he was frustrated about being in the hospital.  Feels better when he can walk around the unit.  Frustrated about being on the unit and dealing with another MI.  Offered therapy, patient declined.  No risk for self and contracts for safety.  Recommend family or someone secures his gun prior to discharge to minimize risks.  HPI per MD:  At approximately [phone] [name] stood up to the bedside to urinate and his EKG began to read about HR-180-200bpm. When assessed by the RN his QRS and P-waves were being counted incorrectly as the ORS only. When the RN helped him get back into bed, he began to state that he should have just pulled off his cords and gotten up to the door. When informed about his HR possibly in the 200s, he stated "I'm gonna die anyway." He also made the statements, "I'm probably going to die this summer." The doctor wants me to come back in 3-4 weeks, but I might just take then pills and stay home. Then I could just die with my dogs." "I have a gun at home and I should have just stayed there instead of..."  "You know my sister  works for the police and she told me a story about a person who died at home with their dogs and then the dogs started to eat on them because they got hungry; I could just stay at home with my dogs and die like that." The RN informed the charge RN and already had another RN at the bedside. Christen Butter, MD was contacted and informed of the events. He agreed with having a suicide sitter at the bedside and said he would be keeping [name] here longer for further psychological monitoring.   Past Psychiatric History: none  Risk to Self:   Risk to Others:   Prior Inpatient Therapy:   Prior Outpatient Therapy:    Past Medical History:  Past Medical History:  Diagnosis Date  . Back pain   . Coronary artery disease   . Hypercholesteremia   . Hyperlipidemia     Past Surgical History:  Procedure Laterality Date  . BACK SURGERY    . BACK SURGERY    . CLUB FOOT RELEASE    . CORONARY ATHERECTOMY N/A 12/18/2019   Procedure: CORONARY ATHERECTOMY;  Surgeon: Nigel Mormon, MD;  Location: [place] [facility];  Service: Cardiovascular;  Laterality: N/A;  . CORONARY STENT INTERVENTION  12/18/2019  . CORONARY STENT INTERVENTION N/A 12/18/2019   Procedure: CORONARY STENT INTERVENTION;  Surgeon: Nigel Mormon, MD;  Location: [place] [facility];  Service: Cardiovascular;  Laterality: N/A;  . CORONARY THROMBECTOMY N/A 04/19/2020   Procedure: Coronary Thrombectomy;  Surgeon: [name], MD;  Location: [place] [facility];  Service: Cardiovascular;  Laterality: N/A;  . CORONARY/GRAFT ACUTE MI REVASCULARIZATION N/A 04/19/2020   Procedure: Coronary/Graft Acute MI Revascularization;  Surgeon: [name], MD;  Location: [place] [facility];  Service: Cardiovascular;  Laterality: N/A;  . INTRAVASCULAR ULTRASOUND/IVUS N/A 12/18/2019   Procedure: Intravascular Ultrasound/IVUS;  Surgeon: Nigel Mormon, MD;  Location: [place] [facility];  Service: Cardiovascular;  Laterality: N/A;  . LEFT HEART CATH AND  CORONARY ANGIOGRAPHY N/A 12/18/2019   Procedure: LEFT HEART CATH AND CORONARY ANGIOGRAPHY;  Surgeon: Nigel Mormon, MD;  Location: [facility] [facility];  Service: Cardiovascular;  Laterality: N/A;  . LEFT HEART CATH AND CORONARY ANGIOGRAPHY N/A 04/19/2020   Procedure: LEFT HEART CATH AND CORONARY ANGIOGRAPHY;  Surgeon: [name], MD;  Location: [place] [facility];  Service: Cardiovascular;  Laterality: N/A;  . SHUNT REPLACEMENT     at 22 months of age   Family History:  Family History  Problem Relation Age of Onset  . Allergies Sister   . Heart attack Mother   . Arthritis Mother   . Lung cancer Mother   . Arthritis Sister   . Alzheimer's disease Father   . Cancer Sister   . Heart attack Brother   . Hyperlipidemia Brother    Family Psychiatric  History: none Social History:  Social History   Substance and Sexual Activity  Alcohol Use Not Currently     Social History   Substance and Sexual Activity  Drug Use No    Social History   Socioeconomic History  . Marital status: Single    Spouse name: Not on file  . Number of children: 0  . Years of education: Not on file  . Highest education level: Not on file  Occupational History  . Occupation: Retired  Tobacco Use  . Smoking status: Never Smoker  . Smokeless tobacco: Never Used  Vaping Use  . Vaping Use: Never used  Substance and Sexual Activity  . Alcohol use: Not Currently  . Drug use: No  . Sexual activity: Not on file  Other Topics Concern  . Not on file  Social History Narrative  . Not on file   Social Determinants of Health   Financial Resource Strain:   . Difficulty of Paying Living Expenses:   Food Insecurity:   . Worried About Charity fundraiser in the Last Year:   . Arboriculturist in the Last Year:   Transportation Needs:   . Film/video editor (Medical):   Marland Kitchen Lack of Transportation (Non-Medical):   Physical Activity:   . Days of Exercise per Week:   . Minutes of Exercise per Session:    Stress:   . Feeling of Stress :   Social Connections:   . Frequency of Communication with Friends and Family:   . Frequency of Social Gatherings with Friends and Family:   . Attends Religious Services:   . Active Member of Clubs or Organizations:   . Attends Archivist Meetings:   Marland Kitchen Marital Status:    Additional Social History:    Allergies:  No Known Allergies  Labs:  Results for orders placed or performed during the hospital encounter of 04/19/20 (from the past 48 hour(s))  Basic metabolic panel     Status: Abnormal   Collection Time: 04/19/20  8:53 PM  Result Value Ref Range   Sodium 132 (L) 135 - 145 mmol/L   Potassium 4.2  3.5 - 5.1 mmol/L   Chloride 98 98 - 111 mmol/L   CO2 24 22 - 32 mmol/L   Glucose, Bld 123 (H) 70 - 99 mg/dL    Comment: Glucose reference range applies only to samples taken after fasting for at least 8 hours.   BUN 16 6 - 20 mg/dL   Creatinine, Ser 1.03 0.61 - 1.24 mg/dL   Calcium 9.2 8.9 - 10.3 mg/dL   GFR calc non Af Amer >60 >60 mL/min   GFR calc Af Amer >60 >60 mL/min   Anion gap 10 5 - 15    Comment: Performed at [facility] [address] [place], [place] [address]  CBC     Status: Abnormal   Collection Time: 04/19/20  8:53 PM  Result Value Ref Range   WBC 9.3 4.0 - 10.5 K/uL   RBC 4.40 4.22 - 5.81 MIL/uL   Hemoglobin 12.8 (L) 13.0 - 17.0 g/dL   HCT 39.0 39 - 52 %   MCV 88.6 80.0 - 100.0 fL   MCH 29.1 26.0 - 34.0 pg   MCHC 32.8 30.0 - 36.0 g/dL   RDW 12.5 11.5 - 15.5 %   Platelets 325 150 - 400 K/uL   nRBC 0.0 0.0 - 0.2 %    Comment: Performed at [facility], [place] [address] [place], [place] [address]  Troponin I (High Sensitivity)     Status: Abnormal   Collection Time: 04/19/20  8:53 PM  Result Value Ref Range   Troponin I (High Sensitivity) 37 (H) <18 ng/L    Comment: (NOTE) Elevated high sensitivity troponin I (hsTnI) values and significant  changes across serial measurements may suggest ACS but many  other  chronic and acute conditions are known to elevate hsTnI results.  Refer to the "Links" section for chest pain algorithms and additional  guidance. Performed at [facility], [place] [address] [place], [place] [address]   SARS Coronavirus 2 by RT PCR (hospital order, performed in [facility] hospital lab) Nasopharyngeal Nasopharyngeal Swab     Status: None   Collection Time: 04/19/20 10:26 PM   Specimen: Nasopharyngeal Swab  Result Value Ref Range   SARS Coronavirus 2 NEGATIVE NEGATIVE    Comment: (NOTE) SARS-CoV-2 target nucleic acids are NOT DETECTED.  The SARS-CoV-2 RNA is generally detectable in upper and lower respiratory specimens during the acute phase of infection. The lowest concentration of SARS-CoV-2 viral copies this assay can detect is 250 copies / mL. A negative result does not preclude SARS-CoV-2 infection and should not be used as the sole basis for treatment or other patient management decisions.  A negative result may occur with improper specimen collection / handling, submission of specimen other than nasopharyngeal swab, presence of viral mutation(s) within the areas targeted by this assay, and inadequate number of viral copies (<250 copies / mL). A negative result must be combined with clinical observations, patient history, and epidemiological information.  Fact Sheet for Patients:   StrictlyIdeas.no  Fact Sheet for Healthcare Providers: BankingDealers.co.za  This test is not yet approved or  cleared by the [place] FDA and has been authorized for detection and/or diagnosis of SARS-CoV-2 by FDA under an Emergency Use Authorization (EUA).  This EUA will remain in effect (meaning this test can be used) for the duration of the COVID-19 declaration under Section 564(b)(1) of the Act, 21 U.S.C. section 360bbb-3(b)(1), unless the authorization is terminated or revoked sooner.  Performed at [facility], [place] [address], [place], [place] [address]   Heparin level (unfractionated)     Status: Abnormal   Collection Time: 04/20/20  3:44 AM  Result Value Ref Range   Heparin Unfractionated 1.72 (H) 0.30 - 0.70 IU/mL    Comment: RESULTS CONFIRMED BY MANUAL DILUTION (NOTE) If heparin results are below expected values, and patient dosage has  been confirmed, suggest follow up testing of antithrombin III levels. Performed at [facility], [place] [address], [place], [place] [address]   CBC     Status: Abnormal   Collection Time: 04/20/20  3:44 AM  Result Value Ref Range   WBC 11.5 (H) 4.0 - 10.5 K/uL   RBC 3.98 (L) 4.22 - 5.81 MIL/uL   Hemoglobin 11.6 (L) 13.0 - 17.0 g/dL   HCT 35.2 (L) 39 - 52 %   MCV 88.4 80.0 - 100.0 fL   MCH 29.1 26.0 - 34.0 pg   MCHC 33.0 30.0 - 36.0 g/dL   RDW 12.8 11.5 - 15.5 %   Platelets 282 150 - 400 K/uL   nRBC 0.0 0.0 - 0.2 %    Comment: Performed at [facility], [place] [address], [place], [place] [address]  Basic metabolic panel     Status: Abnormal   Collection Time: 04/20/20  3:44 AM  Result Value Ref Range   Sodium 133 (L) 135 - 145 mmol/L   Potassium 3.9 3.5 - 5.1 mmol/L   Chloride 104 98 - 111 mmol/L   CO2 22 22 - 32 mmol/L   Glucose, Bld 157 (H) 70 - 99 mg/dL    Comment: Glucose reference range applies only to samples taken after fasting for at least 8 hours.   BUN 15 6 - 20 mg/dL   Creatinine, Ser 0.97 0.61 - 1.24 mg/dL   Calcium 7.9 (L) 8.9 - 10.3 mg/dL   GFR calc non Af Amer >60 >60 mL/min   GFR calc Af Amer >60 >60 mL/min   Anion gap 7 5 - 15    Comment: Performed at [facility] [address], [place], [place] [address]  Troponin I (High Sensitivity)     Status: Abnormal   Collection Time: 04/20/20  3:44 AM  Result Value Ref Range   Troponin I (High Sensitivity) >27,000 (HH) <18 ng/L    Comment: RESULT CONFIRMED BY AUTOMATED DILUTION CRITICAL RESULT CALLED TO, READ BACK BY AND VERIFIED WITHAnnice Pih RN  [phone] Sander Radon Performed at [facility], [address], [place], [place] [address]   MRSA PCR Screening     Status: None   Collection Time: 04/20/20  4:31 AM   Specimen: Nasal Mucosa; Nasopharyngeal  Result Value Ref Range   MRSA by PCR NEGATIVE NEGATIVE    Comment:        The GeneXpert MRSA Assay (FDA approved for NASAL specimens only), is one component of a comprehensive MRSA colonization surveillance program. It is not intended to diagnose MRSA infection nor to guide or monitor treatment for MRSA infections. Performed at [facility], [place] [address] [place], [place] [address]   Basic metabolic panel     Status: Abnormal   Collection Time: 04/20/20 10:58 AM  Result Value Ref Range   Sodium 134 (L) 135 - 145 mmol/L   Potassium 3.6 3.5 - 5.1 mmol/L   Chloride 104 98 - 111 mmol/L   CO2 22 22 - 32 mmol/L   Glucose, Bld 149 (H)  70 - 99 mg/dL    Comment: Glucose reference range applies only to samples taken after fasting for at least 8 hours.   BUN 15 6 - 20 mg/dL   Creatinine, Ser 0.94 0.61 - 1.24 mg/dL   Calcium 8.3 (L) 8.9 - 10.3 mg/dL   GFR calc non Af Amer >60 >60 mL/min   GFR calc Af Amer >60 >60 mL/min   Anion gap 8 5 - 15    Comment: Performed at [facility] [address] [place], [place] [address]  Brain natriuretic peptide     Status: Abnormal   Collection Time: 04/20/20 10:58 AM  Result Value Ref Range   B Natriuretic Peptide 378.8 (H) 0.0 - 100.0 pg/mL    Comment: Performed at [facility] [address], [place], [place] [address]  Magnesium     Status: None   Collection Time: 04/20/20 10:58 AM  Result Value Ref Range   Magnesium 1.8 1.7 - 2.4 mg/dL    Comment: Performed at [facility] [address], [place], [place] [address]  Lipid panel     Status: None   Collection Time: 04/21/20  3:20 AM  Result Value Ref Range   Cholesterol 128 0 - 200 mg/dL   Triglycerides 69 <150 mg/dL   HDL 62 >40 mg/dL   Total  CHOL/HDL Ratio 2.1 RATIO   VLDL 14 0 - 40 mg/dL   LDL Cholesterol 52 0 - 99 mg/dL    Comment:        Total Cholesterol/HDL:CHD Risk Coronary Heart Disease Risk Table                     Men   Women  1/2 Average Risk   3.4   3.3  Average Risk       5.0   4.4  2 X Average Risk   9.6   7.1  3 X Average Risk  23.4   11.0        Use the calculated Patient Ratio above and the CHD Risk Table to determine the patient's CHD Risk.        ATP III CLASSIFICATION (LDL):  <100     mg/dL   Optimal  100-129  mg/dL   Near or Above                    Optimal  130-159  mg/dL   Borderline  160-189  mg/dL   High  >190     mg/dL   Very High Performed at [facility] [address], [place], [place] [address]   CBC     Status: Abnormal   Collection Time: 04/21/20  3:20 AM  Result Value Ref Range   WBC 12.0 (H) 4.0 - 10.5 K/uL   RBC 4.09 (L) 4.22 - 5.81 MIL/uL   Hemoglobin 11.8 (L) 13.0 - 17.0 g/dL   HCT 36.2 (L) 39 - 52 %   MCV 88.5 80.0 - 100.0 fL   MCH 28.9 26.0 - 34.0 pg   MCHC 32.6 30.0 - 36.0 g/dL   RDW 13.1 11.5 - 15.5 %   Platelets 281 150 - 400 K/uL   nRBC 0.0 0.0 - 0.2 %    Comment: Performed at [facility], [place] [address], [place], [place] [address]  Hemoglobin A1c     Status: Abnormal   Collection Time: 04/21/20  3:20 AM  Result Value Ref Range  Hgb A1c MFr Bld 5.9 (H) 4.8 - 5.6 %    Comment: (NOTE) Pre diabetes:          5.7%-6.4%  Diabetes:              >6.4%  Glycemic control for   <7.0% adults with diabetes    Mean Plasma Glucose 122.63 mg/dL    Comment: Performed at [facility], [place] [address] [place], [place] [address]  Potassium     Status: None   Collection Time: 04/21/20  8:30 AM  Result Value Ref Range   Potassium 4.2 3.5 - 5.1 mmol/L    Comment: Performed at [facility] [address], [place], [place] [address]    Current Facility-Administered Medications  Medication Dose Route Frequency Provider Last Rate Last Admin  . 0.9  %  sodium chloride infusion  250 mL Intravenous PRN [name], MD      . acetaminophen (TYLENOL) tablet 650 mg  650 mg Oral Q4H PRN [name], MD      . aspirin chewable tablet 81 mg  81 mg Oral Daily [name], MD   81 mg at 04/21/20 0912  . bisacodyl (DULCOLAX) suppository 10 mg  10 mg Rectal Daily PRN [name], MD   10 mg at 04/20/20 1005  . Chlorhexidine Gluconate Cloth 2 % PADS 6 each  6 each Topical Daily [name], MD   6 each at 04/21/20 0914  . DOPamine (INTROPIN) 800 mg in dextrose 5 % 250 mL (3.2 mg/mL) infusion  0-20 mcg/kg/min Intravenous Continuous [name], MD   Stopped at 04/19/20 2353  . [START ON 04/22/2020] furosemide (LASIX) tablet 20 mg  20 mg Oral Daily Patwardhan, Manish J, MD      . lisinopril (ZESTRIL) tablet 5 mg  5 mg Oral Daily Patwardhan, Manish J, MD   5 mg at 04/21/20 0912  . metoprolol succinate (TOPROL-XL) 24 hr tablet 25 mg  25 mg Oral Daily Patwardhan, Manish J, MD   25 mg at 04/21/20 0912  . nitroGLYCERIN (NITROSTAT) SL tablet 0.4 mg  0.4 mg Sublingual Q5 min PRN [name], MD      . nitroGLYCERIN 50 mg in dextrose 5 % 250 mL (0.2 mg/mL) infusion  0-200 mcg/min Intravenous Continuous Couture, Cortni S, PA-C   Held at 04/19/20 2231  . ondansetron (ZOFRAN) injection 4 mg  4 mg Intravenous Q6H PRN [name], MD      . pantoprazole (PROTONIX) EC tablet 40 mg  40 mg Oral Daily [name], MD   40 mg at 04/21/20 0912  . potassium chloride SA (KLOR-CON) CR tablet 20 mEq  20 mEq Oral Daily Patwardhan, Manish J, MD   20 mEq at 04/21/20 0912  . rosuvastatin (CRESTOR) tablet 40 mg  40 mg Oral QHS Tolia, Sunit, DO      . sodium chloride flush (NS) 0.9 % injection 3 mL  3 mL Intravenous Q12H [name], MD   3 mL at 04/20/20 2200  . sodium chloride flush (NS) 0.9 % injection 3 mL  3 mL Intravenous Q12H [name], MD   3 mL at 04/21/20 0914  . sodium chloride flush (NS) 0.9 % injection 3 mL  3 mL Intravenous PRN [name], MD      . ticagrelor [facility]) tablet 90  mg  90 mg Oral BID [name], MD   90 mg at 04/21/20 0912  . traMADol (ULTRAM) tablet 75 mg  75 mg Oral  Q12H PRN [name], MD   75 mg at 04/21/20 1557    Musculoskeletal: Strength & Muscle Tone: decreased Gait & Station: normal Patient leans: N/A  Psychiatric Specialty Exam: Physical Exam Vitals and nursing note reviewed.  Constitutional:      Appearance: He is well-developed.  HENT:     Head: Normocephalic.  Pulmonary:     Effort: Pulmonary effort is normal.     Breath sounds: Normal breath sounds.  Musculoskeletal:        General: Normal range of motion.     Cervical back: Normal range of motion.  Neurological:     General: No focal deficit present.     Mental Status: He is alert.  Psychiatric:        Attention and Perception: Attention and perception normal.        Mood and Affect: Affect is inappropriate.        Speech: Speech normal.        Behavior: Behavior normal. Behavior is cooperative.        Thought Content: Thought content normal.        Cognition and Memory: Cognition and memory normal.        Judgment: Judgment normal.     Review of Systems  Psychiatric/Behavioral: The patient is nervous/anxious.   All other systems reviewed and are negative.   Blood pressure (!) 81/60, pulse 84, temperature 98.2 F (36.8 C), temperature source Oral, resp. rate 14, height 5\' 7"  (1.702 m), weight 69.6 kg, SpO2 97 %.Body mass index is 24.03 kg/m.  General Appearance: Casual  Eye Contact:  Good  Speech:  Normal Rate  Volume:  Normal  Mood:  Anxious, mild  Affect:  Congruent  Thought Process:  Coherent and Descriptions of Associations: Intact  Orientation:  Full (Time, Place, and Person)  Thought Content:  WDL and Logical  Suicidal Thoughts:  No  Homicidal Thoughts:  No  Memory:  Immediate;   Good Recent;   Good Remote;   Good  Judgement:  Fair  Insight:  Fair  Psychomotor Activity:  Decreased  Concentration:  Concentration: Good and Attention Span: Good   Recall:  [employer] of Knowledge:  Fair  Language:  Good  Akathisia:  No  Handed:  Right  AIMS (if indicated):     Assets:  Housing Leisure Time Resilience Social Support  ADL's:  Intact  Cognition:  WNL  Sleep:        Treatment Plan Summary: Adjustment disorder with mixed disturbance of emotions and conduct: -Recommended therapy, patient declined. -Request family to remove gun from home prior to discharge.  Disposition: No evidence of imminent risk to self or others at present.    Waylan Boga, NP 04/21/2020 4:36 PM

## 2020-04-21 NOTE — Progress Notes (Signed)
CARDIAC REHAB PHASE I   PRE:  Rate/Rhythm: 88 SR  BP:  Sitting: 105/73      SaO2: 98 RA  MODE:  Ambulation: 250 ft   POST:  Rate/Rhythm: 123 ST  BP:  Sitting: 116/76    SaO2: 95 RA  Pt ambulated 226ft in hallway assist of one with front wheel walker. Pt denies CP, SOB, or dizziness. Pt returned to recliner. Stressed importance of med compliance with ASA and Brilinta. Pt given MI book along with heart healthy diet. Will continue to follow and reinforce education.  [phone] Rufina Falco, RN BSN 04/21/2020 9:26 AM

## 2020-04-21 NOTE — Progress Notes (Signed)
At approximately [phone] [id number] Cody Preston stood up to the bedside to urinate and his EKG began to read about HR-180-200bpm. When assessed by the RN his QRS and P-waves were being counted incorrectly as the ORS only. When the RN helped him get back into bed, he began to state that he should have just pulled off his cords and gotten up to the door. When informed about his HR possibly in the 200s, he stated "I'm gonna die anyway." He also made the statements, "I'm probably going to die this summer." The doctor wants me to come back in 3-4 weeks, but I might just take then pills and stay home. Then I could just die with my dogs." "I have a gun at home and I should have just stayed there instead of..."  "You know my sister works for the police and she told me a story about a person who died at home with their dogs and then the dogs started to eat on them because they got hungry; I could just stay at home with my dogs and die like that." The RN informed the charge RN and already had another RN at the bedside. Christen Butter, MD was contacted and informed of the events. He agreed with having a suicide sitter at the bedside and said he would be keeping Cody Preston here longer for further psychological monitoring.

## 2020-04-22 LAB — CBC
HCT: 33.1 % — ABNORMAL LOW (ref 39.0–52.0)
Hemoglobin: 11 g/dL — ABNORMAL LOW (ref 13.0–17.0)
MCH: 28.9 pg (ref 26.0–34.0)
MCHC: 33.2 g/dL (ref 30.0–36.0)
MCV: 86.9 fL (ref 80.0–100.0)
Platelets: 259 10*3/uL (ref 150–400)
RBC: 3.81 MIL/uL — ABNORMAL LOW (ref 4.22–5.81)
RDW: 13 % (ref 11.5–15.5)
WBC: 9.1 10*3/uL (ref 4.0–10.5)
nRBC: 0 % (ref 0.0–0.2)

## 2020-04-22 LAB — LIPOPROTEIN A (LPA): Lipoprotein (a): <8.4 nmol/L (ref <75.0)

## 2020-04-22 MED FILL — ACETAMINOPHEN 325 MG TABS: 325 | 15 days supply | Qty: 60 | Fill #0

## 2020-04-22 MED FILL — LISINOPRIL 5 MG TABS: 5 | 30 days supply | Qty: 30 | Fill #0

## 2020-04-22 MED FILL — ASPIRIN LOW DOSE 81 MG CHEW: 81 | 30 days supply | Qty: 30 | Fill #0

## 2020-04-22 MED FILL — BRILINTA 90 MG TABLET: 90 | 30 days supply | Qty: 60 | Fill #0

## 2020-04-22 MED FILL — FUROSEMIDE 20 MG TAB: 20 | 30 days supply | Qty: 30 | Fill #0

## 2020-04-22 MED FILL — METOPROLOL SUCCINATE ER 25: 25 | 30 days supply | Qty: 30 | Fill #0

## 2020-04-22 MED FILL — NITROGLYCERIN 0.4 MG TAB SL: 0.4 | 7 days supply | Qty: 25 | Fill #0

## 2020-04-22 NOTE — Progress Notes (Signed)
Transition of care pharmacy called and verified meds will be ready for discharge between 11&12.

## 2020-04-22 NOTE — Progress Notes (Signed)
  Mobility Specialist Criteria Algorithm Info.  SATURATION QUALIFICATIONS: (This note is used to comply with regulatory documentation for home oxygen)  Patient Saturations on Room Air at Rest = 97%  Patient Saturations on Room Air while Ambulating = 98%  Patient Saturations on 0 Liters of oxygen while Ambulating = n/a%  Please briefly explain why patient needs home oxygen:  Mobility Team:  [facility] elevated:Self regulated Activity: Ambulated in hall;Dangled on edge of bed Range of motion: Active;All extremities Level of assistance: Standby assist, set-up cues, supervision of patient - no hands on Assistive device: Front wheel walker Minutes sitting in chair:  Minutes stood: 5 minutes Minutes ambulated: 5 minutes Distance ambulated (ft): 1500 ft Mobility response: Tolerated well (HR elevated 150+) Bed Position: Semi-fowlers  Pre: BP 102/76 HR 120+ During:   HR 155 Post: BP 117/80 HR 110  Pt tolerated ambulation well. Per pt, has a cane and uses intermittently at home but would prefer a walker. Ambulated 1544ft with min G/standby and RW. No complaints of SOB, CP/pressure, dizziness or lightheadedness.   04/22/2020 9:48 AM

## 2020-04-22 NOTE — Discharge Summary (Signed)
Physician Discharge Summary  Patient ID: EDIBERTO SENS MRN: [id number] DOB/AGE: 1966-01-12 54 y.o.  Admit date: 04/19/2020 Discharge date: 04/22/2020  Primary Discharge Diagnosis: NSTEMI  Secondary Discharge Diagnosis: NSTEMI   Hospital Course:   54 y/o Caucasian male with hypertension, hyperlipidemia, strong family h/o early CAD, prio LAD stent, admitted with acute coronary syndrome on 04/20/2020. He was found to have prox LCx occlusion in a tortuous, calcified segment of LCx. Flow was reestablished with successful PTCA. However, stent could not be delivered in spite of using multiple different guides, wires, as well as stents.  Patient had elevated BNP and vascular congestion on admission, which improved with diuresis.  EF was mildly reduced at 40 to 45% with mid to basal inferior hypokinesis on echocardiogram.  Patient was asymptomatic on discharge and ambulated without any chest pain or hypoxia.  He was discharged on dual antiplatelet therapy with aspirin and Brilinta, along with low-dose metoprolol succinate, lisinopril, and high intensity statin.  Patient will be screened for EMPACT MI clinical trial during his transition of care visit next week. Elective stent placement will be attempted in 4-6 weeks.  On a separate note, patient reportedly expressed wishes that "he would rather just die".  Psychiatry consult was obtained, who felt that this was more adjustment disorder with no immediate threat to his life.  Discharge Exam: Blood pressure 117/80, pulse 80, temperature 98.7 F (37.1 C), temperature source Oral, resp. rate 16, height 5\' 7"  (1.702 m), weight 66.2 kg, SpO2 93 %.   Physical Exam Vitals and nursing note reviewed.  Constitutional:      General: He is not in acute distress. Neck:     Vascular: No JVD.  Cardiovascular:     Rate and Rhythm: Normal rate and regular rhythm.     Pulses: Intact distal pulses. Decreased pulses.     Heart sounds: Normal heart sounds. No murmur  heard.   Pulmonary:     Effort: Pulmonary effort is normal.     Breath sounds: Normal breath sounds. No wheezing or rales.     Recommendations on discharge:  Emphasized medication compliance   Significant Diagnostic Studies:  EKG 04/20/2020: Sinus tachycardia 100 bpm.   Poor R wave progression.  Lateral infarct age indeterminate. Frequent PVCs  Coronary intervention 04/20/2020: Coronary angiogram and angioplasty 04/19/2020: LV: Moderate to severely elevated LVEDP of 25 mmHg. No pressure gradient across the aortic valve. LVEF 50% without wall motion abnormality in the RAO projection. No significant MR. RCA: Large caliber vessel, widely patent. Mild disease. Left main: Mildly calcified but no significant disease. LAD: Large caliber vessel. Previously placed stent in the proximal LAD in February 2021 is widely patent. D1 is occluded and that is chronic. Faint ipsilateral collaterals are evident. A moderate-sized D2 is widely patent. Circumflex: Occluded in the proximal segment. Mild to moderate amount of calcification evident. SP thrombectomy with Pronto V4 catheter followed by balloon angioplasty with a 2.5 x 12 mm sapphire balloon. In spite of utilization of multiple guidewires including cougar XT, mailman, and also utilization of guide liner for support. Suspect significant amount of calcification, leading to difficulty in delivering the stents. A 3.5 x 22 mm resolute Onyx and a 3.5 x 18 mm Orsero stents were attempted to cross.  TIMI-3 flow was established with minimal residual stenosis of 5 to 10%.  Recommendation: Patient will be started on aspirin, indefinitely and Brilinta for at least a year. A 4-year. Could consider repeat coronary angiography in 4 to 6 weeks for evaluation  for restenosis, and consider arthrectomy followed by stenting.  If remains stable, can be discharged in 24 to 48 hours. 155 mL contrast utilized. Extremely difficult  procedure.   Echocardiogram 04/20/2020: 1. Left ventricular ejection fraction, by estimation, is 40 to 45%. The  left ventricle has mildly decreased function. The left ventricle  demonstrates regional wall motion abnormalities (see scoring  diagram/findings for description). Left ventricular  diastolic parameters are indeterminate. There is moderate hypokinesis of  the left ventricular, basal-mid inferior wall.  2. Right ventricular systolic function is normal. The right ventricular  size is normal. There is mildly elevated pulmonary artery systolic  pressure.  3. The mitral valve is grossly normal. Moderate to severe mitral valve  regurgitation.  4. Tricuspid valve regurgitation is moderate. RA-RV peak gradient 34  mmHg.  5. Compared to previous study on 11/20/2019, LVEF is lowered from 55-65%  to 40-45%, wall motion abnormalities are new, valvular regurgitation more  prominent.   EKG 04/19/2020: Junctional rhythm 35 bpm.  Anterolateral ST depression.   Labs:   Lab Results  Component Value Date   WBC 9.1 04/22/2020   HGB 11.0 (L) 04/22/2020   HCT 33.1 (L) 04/22/2020   MCV 86.9 04/22/2020   PLT 259 04/22/2020    Recent Labs  Lab 04/20/20 1058 04/20/20 1058 04/21/20 0830  NA 134*  --   --   K 3.6   < > 4.2  CL 104  --   --   CO2 22  --   --   BUN 15  --   --   CREATININE 0.94  --   --   CALCIUM 8.3*  --   --   GLUCOSE 149*  --   --    < > = values in this interval not displayed.    Lipid Panel     Component Value Date/Time   CHOL 128 04/21/2020 0320   TRIG 69 04/21/2020 0320   HDL 62 04/21/2020 0320   CHOLHDL 2.1 04/21/2020 0320   VLDL 14 04/21/2020 0320   LDLCALC 52 04/21/2020 0320    BNP (last 3 results) Recent Labs    04/20/20 1058  BNP 378.8*   Results for QUINDARIUS, CABELLO (MRN [id number]) as of 04/22/2020 12:17  Ref. Range 04/19/2020 20:53 04/20/2020 03:44  Troponin I (High Sensitivity) Latest Ref Range: <18 ng/L 37 (H) >27,000 (HH)     HEMOGLOBIN A1C Lab Results  Component Value Date   HGBA1C 5.9 (H) 04/21/2020   MPG 122.63 04/21/2020     Radiology: DG Chest 2 View  Result Date: 04/19/2020 CLINICAL DATA:  Chest pain EXAM: CHEST - 2 VIEW COMPARISON:  Radiograph 03/07/2014, CT 08/30/2014 FINDINGS: Chronically coarsened interstitial and bronchitic features are similar to comparison exams. Mild hyperinflation and flattening of the diaphragms. No consolidation, features of edema, pneumothorax, or effusion. The cardiomediastinal contours are unremarkable. No acute osseous or soft tissue abnormality. Telemetry leads overlie the chest. IMPRESSION: 1. No acute cardiopulmonary disease. 2. Chronically coarsened interstitial and bronchitic features with hyperinflation. Electronically Signed   By: Lovena Le M.D.   On: 04/19/2020 21:45   CARDIAC CATHETERIZATION  Result Date: 04/20/2020 Coronary angiogram and angioplasty 04/19/2020: LV: Moderate to severely elevated LVEDP of 25 mmHg.  No pressure gradient across the aortic valve.  LVEF 50% without wall motion abnormality in the RAO projection.  No significant MR. RCA: Large caliber vessel, widely patent.  Mild disease. Left main: Mildly calcified but no significant disease. LAD: Large caliber vessel.  Previously  placed stent in the proximal LAD in February 2021 is widely patent.  D1 is occluded and that is chronic.  Faint ipsilateral collaterals are evident.  A moderate-sized D2 is widely patent. Circumflex: Occluded in the proximal segment.  Mild to moderate amount of calcification evident.  SP thrombectomy with Pronto V4 catheter followed by balloon angioplasty with a 2.5 x 12 mm sapphire balloon.  In spite of utilization of multiple guidewires including cougar XT, mailman, and also utilization of guide liner for support.  Suspect significant amount of calcification, leading to difficulty in delivering the stents.  A 3.5 x 22 mm resolute Onyx and a 3.5 x 18 mm Orsero stents were  attempted to cross. TIMI-3 flow was established with minimal residual stenosis of 5 to 10%. Recommendation: Patient will be started on aspirin, indefinitely and Brilinta for at least a year.  A 4-year.  Could consider repeat coronary angiography in 4 to 6 weeks for evaluation for restenosis, and consider arthrectomy followed by stenting. If remains stable, can be discharged in 24 to 48 hours.  155 mL contrast utilized.  Extremely difficult procedure.  ECHOCARDIOGRAM COMPLETE  Result Date: 04/20/2020    ECHOCARDIOGRAM REPORT   Patient Name:   Cody Preston Date of Exam: 04/20/2020 Medical Rec #:  [id number]   Height:       67.0 in Accession #:    [id number]  Weight:       153.4 lb Date of Birth:  1966/03/27   BSA:          1.807 m Patient Age:    7 years    BP:           95/57 mmHg Patient Gender: M           HR:           93 bpm. Exam Location:  Inpatient Procedure: 2D Echo, Color Doppler and Cardiac Doppler Indications:    I25.5 Ischemic cardiomyopathy  History:        Patient has prior history of Echocardiogram examinations, most                 recent 10/26/2019.  Sonographer:    Merrie Roof RDCS Referring Phys: [id number] [place]  1. Left ventricular ejection fraction, by estimation, is 40 to 45%. The left ventricle has mildly decreased function. The left ventricle demonstrates regional wall motion abnormalities (see scoring diagram/findings for description). Left ventricular diastolic parameters are indeterminate. There is moderate hypokinesis of the left ventricular, basal-mid inferior wall.  2. Right ventricular systolic function is normal. The right ventricular size is normal. There is mildly elevated pulmonary artery systolic pressure.  3. The mitral valve is grossly normal. Moderate to severe mitral valve regurgitation.  4. Tricuspid valve regurgitation is moderate. RA-RV peak gradient 34 mmHg.  5. Compared to previous study on 11/20/2019, LVEF is lowered from 55-65% to 40-45%, wall motion  abnormalities are new, valvular regurgitation more prominent. FINDINGS  Left Ventricle: Left ventricular ejection fraction, by estimation, is 40 to 45%. The left ventricle has mildly decreased function. The left ventricle demonstrates regional wall motion abnormalities. Moderate hypokinesis of the left ventricular, basal-mid inferior wall. The left ventricular internal cavity size was normal in size. There is no left ventricular hypertrophy. Left ventricular diastolic parameters are indeterminate. Right Ventricle: The right ventricular size is normal. No increase in right ventricular wall thickness. Right ventricular systolic function is normal. There is mildly elevated pulmonary artery systolic pressure. The tricuspid regurgitant velocity is  2.91  m/s, and with an assumed right atrial pressure of 10 mmHg, the estimated right ventricular systolic pressure is 78.4 mmHg. Left Atrium: Left atrial size was normal in size. Right Atrium: Right atrial size was normal in size. Pericardium: There is no evidence of pericardial effusion. Mitral Valve: The mitral valve is grossly normal. Moderate to severe mitral valve regurgitation, with posteriorly-directed jet. Tricuspid Valve: The tricuspid valve is grossly normal. Tricuspid valve regurgitation is moderate. Aortic Valve: The aortic valve is normal in structure. Aortic valve regurgitation is not visualized. Pulmonic Valve: The pulmonic valve was grossly normal. Pulmonic valve regurgitation is mild. Aorta: The aortic root is normal in size and structure. Venous: The inferior vena cava was not well visualized. IAS/Shunts: The atrial septum is grossly normal.  LEFT VENTRICLE PLAX 2D LVIDd:         4.70 cm     Diastology LVIDs:         3.50 cm     LV e' lateral:   11.70 cm/s LV PW:         1.10 cm     LV E/e' lateral: 7.3 LV IVS:        1.20 cm     LV e' medial:    8.38 cm/s LVOT diam:     1.80 cm     LV E/e' medial:  10.2 LV SV:         47 LV SV Index:   26 LVOT Area:     2.54  cm                             3D Volume EF: LV Volumes (MOD)           3D EF:        47 % LV vol d, MOD A2C: 83.7 ml LV EDV:       125 ml LV vol d, MOD A4C: 87.5 ml LV ESV:       66 ml LV vol s, MOD A2C: 39.1 ml LV SV:        59 ml LV vol s, MOD A4C: 51.3 ml LV SV MOD A2C:     44.6 ml LV SV MOD A4C:     87.5 ml LV SV MOD BP:      43.0 ml RIGHT VENTRICLE RV Basal diam:  3.50 cm RV S prime:     11.10 cm/s TAPSE (M-mode): 2.0 cm LEFT ATRIUM             Index       RIGHT ATRIUM           Index LA diam:        4.50 cm 2.49 cm/m  RA Area:     12.60 cm LA Vol (A2C):   69.8 ml 38.63 ml/m RA Volume:   29.00 ml  16.05 ml/m LA Vol (A4C):   45.5 ml 25.18 ml/m LA Biplane Vol: 58.7 ml 32.49 ml/m  AORTIC VALVE LVOT Vmax:   94.00 cm/s LVOT Vmean:  59.900 cm/s LVOT VTI:    0.184 m  AORTA Ao Root diam: 3.30 cm Ao Asc diam:  2.80 cm MITRAL VALVE                 TRICUSPID VALVE MV Area (PHT): 3.72 cm      TR Peak grad:   33.9 mmHg MV Decel Time: 204 msec      TR Vmax:  291.00 cm/s MR Peak grad:    91.4 mmHg MR Mean grad:    65.0 mmHg   SHUNTS MR Vmax:         478.00 cm/s Systemic VTI:  0.18 m MR Vmean:        378.0 cm/s  Systemic Diam: 1.80 cm MR PISA:         1.27 cm MR PISA Eff ROA: 8 mm MR PISA Radius:  0.45 cm MV E velocity: 85.70 cm/s MV A velocity: 45.40 cm/s MV E/A ratio:  1.89 Zabian Swayne MD Electronically signed by Vernell Leep MD Signature Date/Time: 04/20/2020/9:30:25 PM    Final       FOLLOW UP PLANS AND APPOINTMENTS Discharge Instructions    AMB Referral to Cardiac Rehabilitation - Phase II   Complete by: As directed    Diagnosis:  PTCA NSTEMI     After initial evaluation and assessments completed: Virtual Based Care may be provided alone or in conjunction with Phase 2 Cardiac Rehab based on patient barriers.: Yes   Diet - low sodium heart healthy   Complete by: As directed    Increase activity slowly   Complete by: As directed      Allergies as of 04/22/2020   No Known  Allergies     Medication List    STOP taking these medications   amLODipine 5 MG tablet Commonly known as: NORVASC   clopidogrel 75 MG tablet Commonly known as: Plavix   diclofenac Sodium 1 % Gel Commonly known as: VOLTAREN   traMADol 50 MG tablet Commonly known as: ULTRAM     TAKE these medications   acetaminophen 325 MG tablet Commonly known as: TYLENOL Take 2 tablets (650 mg total) by mouth every 12 (twelve) hours as needed for moderate pain.   aspirin 81 MG chewable tablet Chew 1 tablet (81 mg total) by mouth daily.   furosemide 20 MG tablet Commonly known as: LASIX Take 1 tablet (20 mg total) by mouth daily as needed. Daily as needed for leg edema   lisinopril 5 MG tablet Commonly known as: ZESTRIL Take 1 tablet (5 mg total) by mouth daily.   metoprolol succinate 25 MG 24 hr tablet Commonly known as: TOPROL-XL Take 1 tablet (25 mg total) by mouth daily. What changed:   medication strength  See the new instructions.   nitroGLYCERIN 0.4 MG SL tablet Commonly known as: NITROSTAT Place 1 tablet (0.4 mg total) under the tongue every 5 (five) minutes as needed for chest pain.   pantoprazole 40 MG tablet Commonly known as: Protonix Take 1 tablet (40 mg total) by mouth daily.   rosuvastatin 20 MG tablet Commonly known as: CRESTOR TAKE 1 TABLET (20 MG TOTAL) BY MOUTH DAILY.   ticagrelor 90 MG Tabs tablet Commonly known as: BRILINTA Take 1 tablet (90 mg total) by mouth 2 (two) times daily.            Durable Medical Equipment  (From admission, onward)         Start     Ordered   04/22/20 1008  For home use only DME Walker rolling  Once       Question Answer Comment  Walker: With 5 Inch Wheels   Patient needs a walker to treat with the following condition Weakness      04/22/20 1008          Follow-up Information    Iona Beard, MD.   Specialty: Family Medicine Contact information: [address] [place]  [address] [phone]        [facility] Follow up.   Why: rolling walker Contact information: [address] [place] [address] [place], [place] [facility] Pager: [phone] Office: [phone] If no answer: [phone]

## 2020-04-22 NOTE — Progress Notes (Signed)
Pt is stable at time of discharge teaching. No signs of distress noted. All paperwork reviewed. No signs of distress noted. No needs or concerns at this time. All meds reviewed. All followup appointments reviewed. Pt states he understands all instructions given. Will continue to monitor until pt has left the floor.

## 2020-04-22 NOTE — TOC Transition Note (Signed)
Transition of Care [facility]) - CM/SW Discharge Note   Patient Details  Name: Cody Preston MRN: [id number] Date of Birth: Jan 16, 1966  [facility] CM/SW Contact:  Zenon Mayo, RN Phone Number: 04/22/2020, 10:19 AM   Clinical Narrative:    Patient for dc today, he would like a rolling walker, NCM made referral to [facility] with [facility] , he will bring walker up to room prior to dc.    Final next level of care: Home/Self Care Barriers to Discharge: No Barriers Identified   Patient Goals and CMS Choice     Choice offered to / list presented to : NA  Discharge Placement                       Discharge Plan and Services                DME Arranged: Walker rolling DME Agency: [facility] Date DME Agency Contacted: 04/22/20 Time DME Agency Contacted: 30 Representative spoke with at DME Agency: [place]: NA          Social Determinants of Health ([place]) Interventions     Readmission Risk Interventions No flowsheet data found.

## 2020-04-22 NOTE — Progress Notes (Signed)
CARDIAC REHAB PHASE I   Reinforced importance of med compliance and follow-up appointments. Stressed not driving after taking NTG, even if its "only 2 minutes up the road". Pt states he is "sick of having heart attacks", importance of steps to prevent further risks. Pt referred to CRP II [place] with knowledge pt needs staged intervention.  [id number] Rufina Falco, RN BSN 04/22/2020 9:00 AM

## 2020-04-24 ENCOUNTER — Telehealth: Payer: Self-pay

## 2020-04-24 NOTE — Telephone Encounter (Signed)
LMTCB VM not working.

## 2020-04-25 ENCOUNTER — Telehealth (HOSPITAL_COMMUNITY): Payer: Self-pay

## 2020-04-25 NOTE — Telephone Encounter (Signed)
Pt insurance is active and benefits verified through [facility] Medicare. Co-pay $0.00, DED $0.00/$0.00 met, out of pocket $7,550.00/$0.00 met, co-insurance 20%. No pre-authorization required. Passport, 04/25/20 @ 11:09AM, YQM#[id number]  Pt 2ndary insurance is active through [place]. [id number]  Will fax over Medicaid Reimbursement form to Dr. Virgina Jock  Will contact patient to see if he is interested in the Cardiac Rehab Program. If interested, patient will need to complete follow up appt. Once completed, patient will be contacted for scheduling upon review by the RN Navigator.

## 2020-04-25 NOTE — Telephone Encounter (Signed)
Tried to contact patient VM not working.

## 2020-04-25 NOTE — Telephone Encounter (Signed)
Attempted to call patient in regards to Cardiac Rehab - unable to leave VM, VM full. 

## 2020-04-29 ENCOUNTER — Other Ambulatory Visit: Payer: Self-pay

## 2020-04-29 ENCOUNTER — Encounter: Payer: Self-pay | Admitting: Cardiology

## 2020-04-29 ENCOUNTER — Ambulatory Visit: Payer: Medicare Other | Admitting: Cardiology

## 2020-04-29 VITALS — BP 87/60 | HR 82 | Resp 17 | Ht 67.0 in | Wt 145.6 lb

## 2020-04-29 DIAGNOSIS — E7800 Pure hypercholesterolemia, unspecified: Secondary | ICD-10-CM

## 2020-04-29 DIAGNOSIS — I25118 Atherosclerotic heart disease of native coronary artery with other forms of angina pectoris: Secondary | ICD-10-CM | POA: Diagnosis not present

## 2020-04-29 DIAGNOSIS — I10 Essential (primary) hypertension: Secondary | ICD-10-CM

## 2020-04-29 NOTE — Progress Notes (Signed)
Primary Physician/Referring:  Iona Beard, MD  Patient ID: Cody Preston, male    DOB: Feb 07, 1966, 54 y.o.   MRN: [id number]  Chief Complaint  Patient presents with  . Coronary Artery Disease  . Hospitalization Follow-up  . Transitions Of Care   HPI:    Cody Preston  is a 54 y.o. 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 Sisters present at bedside. He has CAD and h/o angioplasty with orbital atherectomy followed by stent to the proximal LAD on 12/18/2019. Present with ACS and NSTEMI on 04/19/2020, underwent urgent coronary angiogram and balloon angioplasty to occluded large circumflex, but unable to deliver the stent due to calcification.   He now presents for f/u and states he is doing well but generally feels tired. He has not used any s/l NTG.   Past Medical History:  Diagnosis Date  . Back pain   . Coronary artery disease   . Hypercholesteremia   . Hyperlipidemia    Past Surgical History:  Procedure Laterality Date  . BACK SURGERY    . BACK SURGERY    . CLUB FOOT RELEASE    . CORONARY ATHERECTOMY N/A 12/18/2019   Procedure: CORONARY ATHERECTOMY;  Surgeon: Nigel Mormon, MD;  Location: [facility];  Service: Cardiovascular;  Laterality: N/A;  . CORONARY STENT INTERVENTION  12/18/2019  . CORONARY STENT INTERVENTION N/A 12/18/2019   Procedure: CORONARY STENT INTERVENTION;  Surgeon: Nigel Mormon, MD;  Location: [facility];  Service: Cardiovascular;  Laterality: N/A;  . CORONARY THROMBECTOMY N/A 04/19/2020   Procedure: Coronary Thrombectomy;  Surgeon: [name], MD;  Location: [facility];  Service: Cardiovascular;  Laterality: N/A;  .  CORONARY/GRAFT ACUTE MI REVASCULARIZATION N/A 04/19/2020   Procedure: Coronary/Graft Acute MI Revascularization;  Surgeon: [name], MD;  Location: [facility];  Service: Cardiovascular;  Laterality: N/A;  . INTRAVASCULAR ULTRASOUND/IVUS N/A 12/18/2019   Procedure: Intravascular Ultrasound/IVUS;  Surgeon: Nigel Mormon, MD;  Location: [place] [facility];  Service: Cardiovascular;  Laterality: N/A;  . LEFT HEART CATH AND CORONARY ANGIOGRAPHY N/A 12/18/2019   Procedure: LEFT HEART CATH AND CORONARY ANGIOGRAPHY;  Surgeon: Nigel Mormon, MD;  Location: [facility];  Service: Cardiovascular;  Laterality: N/A;  . LEFT HEART CATH AND CORONARY ANGIOGRAPHY N/A 04/19/2020   Procedure: LEFT HEART CATH AND CORONARY ANGIOGRAPHY;  Surgeon: [name], MD;  Location: [facility];  Service: Cardiovascular;  Laterality: N/A;  . SHUNT REPLACEMENT     at 37 months of age   Social History   Tobacco Use  . Smoking status: Never Smoker  . Smokeless tobacco: Never Used  Substance Use Topics  . Alcohol use: Not Currently   ROS  Review of Systems  Constitutional: Positive for malaise/fatigue.  Cardiovascular: Negative for chest pain, dyspnea on exertion and leg swelling.  Gastrointestinal: Negative for melena.   Objective  Blood pressure (!) 87/60, pulse 82, resp. rate 17, height 5\' 7"  (1.702 m), weight 145 lb 9.6 oz (66 kg), SpO2 99 %.  Vitals with BMI 04/29/2020 04/22/2020 04/22/2020  Height 5\' 7"  - -  Weight 145 lbs 10 oz - -  BMI 22.8 - -  Systolic 87 132 91  Diastolic 60 80 57  Pulse 82 - 80     Physical Exam Neck:     Thyroid: No thyromegaly.  Cardiovascular:     Rate and Rhythm: Normal rate and regular rhythm.     Pulses: Intact distal pulses.     Heart sounds: Normal heart sounds. No murmur heard.  No gallop.      Comments: No leg edema, no JVD. Pulmonary:     Effort: Pulmonary effort is normal.     Breath sounds: Normal breath sounds.  Abdominal:      General: Bowel sounds are normal.     Palpations: Abdomen is soft.  Musculoskeletal:     Cervical back: Neck supple.  Skin:    General: Skin is warm and dry.    Laboratory examination:   Recent Labs    04/19/20 2053 04/19/20 2053 04/20/20 0344 04/20/20 1058 04/21/20 0830  NA 132*  --  133* 134*  --   K 4.2   < > 3.9 3.6 4.2  CL 98  --  104 104  --   CO2 24  --  22 22  --   GLUCOSE 123*  --  157* 149*  --   BUN 16  --  15 15  --   CREATININE 1.03  --  0.97 0.94  --   CALCIUM 9.2  --  7.9* 8.3*  --   GFRNONAA >60  --  >60 >60  --   GFRAA >60  --  >60 >60  --    < > = values in this interval not displayed.   estimated creatinine clearance is 85.8 mL/min (by C-G formula based on SCr of 0.94 mg/dL).  CMP Latest Ref Rng & Units 04/21/2020 04/20/2020 04/20/2020  Glucose 70 - 99 mg/dL - 149(H) 157(H)  BUN 6 - 20 mg/dL - 15 15  Creatinine 0.61 - 1.24 mg/dL - 0.94 0.97  Sodium 135 - 145 mmol/L - 134(L) 133(L)  Potassium 3.5 - 5.1 mmol/L 4.2 3.6 3.9  Chloride 98 - 111 mmol/L - 104 104  CO2 22 - 32 mmol/L - 22 22  Calcium 8.9 - 10.3 mg/dL - 8.3(L) 7.9(L)   CBC Latest Ref Rng & Units 04/22/2020 04/21/2020 04/20/2020  WBC 4.0 - 10.5 K/uL 9.1 12.0(H) 11.5(H)  Hemoglobin 13.0 - 17.0 g/dL 11.0(L) 11.8(L) 11.6(L)  Hematocrit 39 - 52 % 33.1(L) 36.2(L) 35.2(L)  Platelets 150 - 400 K/uL 259 281 282   Lipid Panel     Component Value Date/Time   CHOL 128 04/21/2020 0320   TRIG 69 04/21/2020 0320   HDL 62 04/21/2020 0320   CHOLHDL 2.1 04/21/2020 0320   VLDL 14 04/21/2020 0320   LDLCALC 52 04/21/2020 0320   HEMOGLOBIN A1C Lab Results  Component Value Date   HGBA1C 5.9 (H) 04/21/2020   MPG 122.63 04/21/2020   TSH No results for input(s): TSH in the last 8760 hours.  Outside labs: 04/17/2019 Cholesterol, total 162.000 m  HDL 71.000 mg 04/17/2019 LDL 77.000 mg 04/17/2019 Triglycerides 63.000 mg 04/17/2019  Medications and allergies  No Known Allergies   Current Outpatient  Medications  Medication Instructions  . acetaminophen (TYLENOL) 650 mg, Oral, Every 12 hours PRN  . aspirin 81 mg, Oral, Daily  . furosemide (LASIX) 20 mg, Oral, Daily PRN, Daily as needed for leg edema  . metoprolol succinate (TOPROL-XL) 12.5 mg, Oral, Daily  . nitroGLYCERIN (NITROSTAT) 0.4 mg, Sublingual, Every 5 min PRN  . pantoprazole (PROTONIX)  40 mg, Oral, Daily  . rosuvastatin (CRESTOR) 20 mg, Oral, Daily  . ticagrelor (BRILINTA) 90 mg, Oral, 2 times daily    Radiology:  No results found.  Cardiac Studies:   Lexiscan Tetrofosmin Stress Test  11/19/2019: Abnormal ECG stress. Intravenous Lexiscan protocol. Resting EKG/ECG demonstrated normal sinus rhythm. Peak EKG/ECG revealed 1 mm horizontal ST depression of the inferolateral leads. During infusion the patient developed no chest pain, and exhibited arm pain, dizziness, jaw pain, & headache. Large extent perfusion defect consistent with severe ischemia in the mid to distal anterior and anterolateral wall in LAD distribution.  Gated SPECT imaging of the left ventricle demonstrating akinesis in the same region.  Stress LVEF moderately depressed at 37%.  No previous exam available for comparison. High risk study.   Coronary angiogram and intervention 04/20/2020: LV: Moderate to severely elevated LVEDP of 25 mmHg. No pressure gradient across the aortic valve. LVEF 50% without wall motion abnormality in the RAO projection. No significant MR. RCA: Large caliber vessel, widely patent. Mild disease. Left main: Mildly calcified but no significant disease. LAD: Large caliber vessel. Previously placed stent in the proximal LAD in February 2021 is widely patent (stenting 3.0 x 46 mm and a 3.0 x 18 mm resolute Onyx DES). D1 is occluded and that is chronic. Faint ipsilateral collaterals are evident. A moderate-sized D2 is widely patent. Circumflex: Occluded in the proximal segment. Mild to moderate amount of calcification evident. SP  thrombectomy with Pronto V4 catheter followed by balloon angioplasty with a 2.5 x 12 mm sapphire balloon. In spite of utilization of multiple guidewires including cougar XT, mailman, and also utilization of guide liner for support. Suspect significant amount of calcification, leading to difficulty in delivering the stents. A 3.5 x 22 mm resolute Onyx and a 3.5 x 18 mm Orsero stents were attempted to cross.  TIMI-3 flow was established with minimal residual stenosis of 5 to 10%.  Recommendation: Patient will be started on aspirin, indefinitely and Brilinta for at least a year. Could consider repeat coronary angiography in 4 to 6 weeks for evaluation for restenosis, and consider arthrectomy followed by stenting.  If remains stable, can be discharged in 24 to 48 hours. 155 mL contrast utilized. Extremely difficult procedure.  Echocardiogram 04/20/2020: 1. Left ventricular ejection fraction, by estimation, is 40 to 45%. The left ventricle has mildly decreased function. The left ventricle  demonstrates regional wall motion abnormalities (see scoring  diagram/findings for description). Left ventricular  diastolic parameters are indeterminate. There is moderate hypokinesis of  the left ventricular, basal-mid inferior wall.  2. Right ventricular systolic function is normal. The right ventricular  size is normal. There is mildly elevated pulmonary artery systolic  pressure.  3. The mitral valve is grossly normal. Moderate to severe mitral valve  regurgitation.  4. Tricuspid valve regurgitation is moderate. RA-RV peak gradient 34  mmHg.  5. Compared to previous study on 11/20/2019, LVEF is lowered from 55-65%  to 40-45%, wall motion abnormalities are new, valvular regurgitation more  prominent.   EKG:   EKG 04/29/2020: Normal sinus rhythm with rate of 72 bpm, left axis deviation, left intrafascicular block.  Incomplete right bundle branch block.  Cannot exclude posterior infarct old.   Nonspecific T abnormality high lateral leads. Low voltage complexes.   EKG 12/19/2019: Sinus tachycardia at rate of 101 bpm, normal axis, no evidence of ischemia.  Low-voltage complexes.  No significant change from 11/15/2018.  Assessment     ICD-10-CM   1. Coronary artery disease  of native artery of native heart with stable angina pectoris (HCC)  I25.118 EKG 12-Lead    metoprolol succinate (TOPROL-XL) 25 MG 24 hr tablet  2. Primary hypertension  U93 Basic metabolic panel    CBC    TSH  3. Hypercholesteremia  E78.00     No orders of the defined types were placed in this encounter.   Medications Discontinued During This Encounter  Medication Reason  . lisinopril (ZESTRIL) 5 MG tablet Discontinued by provider  . metoprolol succinate (TOPROL-XL) 25 MG 24 hr tablet     Recommendations:   Cody Preston  is a 54 y.o.  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 Sisters present at bedside. He has CAD and h/o angioplasty with orbital atherectomy followed by stent to the proximal LAD on 12/18/2019. Present with ACS and NSTEMI on 04/19/2020, underwent urgent coronary angiogram and balloon angioplasty to occluded large circumflex, but unable to deliver the stent due to calcification.   I discussed the findings of the coronary angiography with the patient and his sister, will proceed with repeat relook angiography in view of very large vessel and high chance of restenosis.  He will need atherectomy.  Risks and benefits discussed with the patient.  Lipids are well controlled, blood pressure is low and his fatigue may be related to this, will discontinue Lisinopril and decrease metoprolol  succinate 12.5 mg daily.  I will see him back after the procedure.   [name], MD, [facility] 04/30/2020, 7:33 AM Office: [phone]

## 2020-04-29 NOTE — H&P (View-Only) (Signed)
Primary Physician/Referring:  Iona Beard, MD  Patient ID: Cody Preston, male    DOB: [birth year], 54 y.o.   MRN: [id number]  Chief Complaint  Patient presents with  . Coronary Artery Disease  . Hospitalization Follow-up  . Transitions Of Care   HPI:    Cody Preston  is a 54 y.o. 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 Sisters present at bedside. He has CAD and h/o angioplasty with orbital atherectomy followed by stent to the proximal LAD on 12/18/2019. Present with ACS and NSTEMI on 04/19/2020, underwent urgent coronary angiogram and balloon angioplasty to occluded large circumflex, but unable to deliver the stent due to calcification.   He now presents for f/u and states he is doing well but generally feels tired. He has not used any s/l NTG.   Past Medical History:  Diagnosis Date  . Back pain   . Coronary artery disease   . Hypercholesteremia   . Hyperlipidemia    Past Surgical History:  Procedure Laterality Date  . BACK SURGERY    . BACK SURGERY    . CLUB FOOT RELEASE    . CORONARY ATHERECTOMY N/A 12/18/2019   Procedure: CORONARY ATHERECTOMY;  Surgeon: Nigel Mormon, MD;  Location: [place];  Service: Cardiovascular;  Laterality: N/A;  . CORONARY STENT INTERVENTION  12/18/2019  . CORONARY STENT INTERVENTION N/A 12/18/2019   Procedure: CORONARY STENT INTERVENTION;  Surgeon: Nigel Mormon, MD;  Location: [facility];  Service: Cardiovascular;  Laterality: N/A;  . CORONARY THROMBECTOMY N/A 04/19/2020   Procedure: Coronary Thrombectomy;  Surgeon: [name], MD;  Location: [place];  Service: Cardiovascular;  Laterality: N/A;  .  CORONARY/GRAFT ACUTE MI REVASCULARIZATION N/A 04/19/2020   Procedure: Coronary/Graft Acute MI Revascularization;  Surgeon: [name], MD;  Location: [facility];  Service: Cardiovascular;  Laterality: N/A;  . INTRAVASCULAR ULTRASOUND/IVUS N/A 12/18/2019   Procedure: Intravascular Ultrasound/IVUS;  Surgeon: Nigel Mormon, MD;  Location: [facility];  Service: Cardiovascular;  Laterality: N/A;  . LEFT HEART CATH AND CORONARY ANGIOGRAPHY N/A 12/18/2019   Procedure: LEFT HEART CATH AND CORONARY ANGIOGRAPHY;  Surgeon: Nigel Mormon, MD;  Location: [place] [facility];  Service: Cardiovascular;  Laterality: N/A;  . LEFT HEART CATH AND CORONARY ANGIOGRAPHY N/A 04/19/2020   Procedure: LEFT HEART CATH AND CORONARY ANGIOGRAPHY;  Surgeon: [name], MD;  Location: [place] [facility];  Service: Cardiovascular;  Laterality: N/A;  . SHUNT REPLACEMENT     at 6 months of age   Social History   Tobacco Use  . Smoking status: Never Smoker  . Smokeless tobacco: Never Used  Substance Use Topics  . Alcohol use: Not Currently   ROS  Review of Systems  Constitutional: Positive for malaise/fatigue.  Cardiovascular: Negative for chest pain, dyspnea on exertion and leg swelling.  Gastrointestinal: Negative for melena.   Objective  Blood pressure (!) 87/60, pulse 82, resp. rate 17, height 5\' 7"  (1.702 m), weight 145 lb 9.6 oz (66 kg), SpO2 99 %.  Vitals with BMI 04/29/2020 04/22/2020 04/22/2020  Height 5\' 7"  - -  Weight 145 lbs 10 oz - -  BMI 22.8 - -  Systolic 87 263 91  Diastolic 60 80 57  Pulse 82 - 80     Physical Exam Neck:     Thyroid: No thyromegaly.  Cardiovascular:     Rate and Rhythm: Normal rate and regular rhythm.     Pulses: Intact distal pulses.     Heart sounds: Normal heart sounds. No murmur heard.  No gallop.      Comments: No leg edema, no JVD. Pulmonary:     Effort: Pulmonary effort is normal.     Breath sounds: Normal breath sounds.  Abdominal:      General: Bowel sounds are normal.     Palpations: Abdomen is soft.  Musculoskeletal:     Cervical back: Neck supple.  Skin:    General: Skin is warm and dry.    Laboratory examination:   Recent Labs    04/19/20 2053 04/19/20 2053 04/20/20 0344 04/20/20 1058 04/21/20 0830  NA 132*  --  133* 134*  --   K 4.2   < > 3.9 3.6 4.2  CL 98  --  104 104  --   CO2 24  --  22 22  --   GLUCOSE 123*  --  157* 149*  --   BUN 16  --  15 15  --   CREATININE 1.03  --  0.97 0.94  --   CALCIUM 9.2  --  7.9* 8.3*  --   GFRNONAA >60  --  >60 >60  --   GFRAA >60  --  >60 >60  --    < > = values in this interval not displayed.   estimated creatinine clearance is 85.8 mL/min (by C-G formula based on SCr of 0.94 mg/dL).  CMP Latest Ref Rng & Units 04/21/2020 04/20/2020 04/20/2020  Glucose 70 - 99 mg/dL - 149(H) 157(H)  BUN 6 - 20 mg/dL - 15 15  Creatinine 0.61 - 1.24 mg/dL - 0.94 0.97  Sodium 135 - 145 mmol/L - 134(L) 133(L)  Potassium 3.5 - 5.1 mmol/L 4.2 3.6 3.9  Chloride 98 - 111 mmol/L - 104 104  CO2 22 - 32 mmol/L - 22 22  Calcium 8.9 - 10.3 mg/dL - 8.3(L) 7.9(L)   CBC Latest Ref Rng & Units 04/22/2020 04/21/2020 04/20/2020  WBC 4.0 - 10.5 K/uL 9.1 12.0(H) 11.5(H)  Hemoglobin 13.0 - 17.0 g/dL 11.0(L) 11.8(L) 11.6(L)  Hematocrit 39 - 52 % 33.1(L) 36.2(L) 35.2(L)  Platelets 150 - 400 K/uL 259 281 282   Lipid Panel     Component Value Date/Time   CHOL 128 04/21/2020 0320   TRIG 69 04/21/2020 0320   HDL 62 04/21/2020 0320   CHOLHDL 2.1 04/21/2020 0320   VLDL 14 04/21/2020 0320   LDLCALC 52 04/21/2020 0320   HEMOGLOBIN A1C Lab Results  Component Value Date   HGBA1C 5.9 (H) 04/21/2020   MPG 122.63 04/21/2020   TSH No results for input(s): TSH in the last 8760 hours.  Outside labs: 04/17/2019 Cholesterol, total 162.000 m  HDL 71.000 mg 04/17/2019 LDL 77.000 mg 04/17/2019 Triglycerides 63.000 mg 04/17/2019  Medications and allergies  No Known Allergies   Current Outpatient  Medications  Medication Instructions  . acetaminophen (TYLENOL) 650 mg, Oral, Every 12 hours PRN  . aspirin 81 mg, Oral, Daily  . furosemide (LASIX) 20 mg, Oral, Daily PRN, Daily as needed for leg edema  . metoprolol succinate (TOPROL-XL) 12.5 mg, Oral, Daily  . nitroGLYCERIN (NITROSTAT) 0.4 mg, Sublingual, Every 5 min PRN  . pantoprazole (PROTONIX)  40 mg, Oral, Daily  . rosuvastatin (CRESTOR) 20 mg, Oral, Daily  . ticagrelor (BRILINTA) 90 mg, Oral, 2 times daily    Radiology:  No results found.  Cardiac Studies:   Lexiscan Tetrofosmin Stress Test  11/19/2019: Abnormal ECG stress. Intravenous Lexiscan protocol. Resting EKG/ECG demonstrated normal sinus rhythm. Peak EKG/ECG revealed 1 mm horizontal ST depression of the inferolateral leads. During infusion the patient developed no chest pain, and exhibited arm pain, dizziness, jaw pain, & headache. Large extent perfusion defect consistent with severe ischemia in the mid to distal anterior and anterolateral wall in LAD distribution.  Gated SPECT imaging of the left ventricle demonstrating akinesis in the same region.  Stress LVEF moderately depressed at 37%.  No previous exam available for comparison. High risk study.   Coronary angiogram and intervention 04/20/2020: LV: Moderate to severely elevated LVEDP of 25 mmHg. No pressure gradient across the aortic valve. LVEF 50% without wall motion abnormality in the RAO projection. No significant MR. RCA: Large caliber vessel, widely patent. Mild disease. Left main: Mildly calcified but no significant disease. LAD: Large caliber vessel. Previously placed stent in the proximal LAD in February 2021 is widely patent (stenting 3.0 x 46 mm and a 3.0 x 18 mm resolute Onyx DES). D1 is occluded and that is chronic. Faint ipsilateral collaterals are evident. A moderate-sized D2 is widely patent. Circumflex: Occluded in the proximal segment. Mild to moderate amount of calcification evident. SP  thrombectomy with Pronto V4 catheter followed by balloon angioplasty with a 2.5 x 12 mm sapphire balloon. In spite of utilization of multiple guidewires including cougar XT, mailman, and also utilization of guide liner for support. Suspect significant amount of calcification, leading to difficulty in delivering the stents. A 3.5 x 22 mm resolute Onyx and a 3.5 x 18 mm Orsero stents were attempted to cross.  TIMI-3 flow was established with minimal residual stenosis of 5 to 10%.  Recommendation: Patient will be started on aspirin, indefinitely and Brilinta for at least a year. Could consider repeat coronary angiography in 4 to 6 weeks for evaluation for restenosis, and consider arthrectomy followed by stenting.  If remains stable, can be discharged in 24 to 48 hours. 155 mL contrast utilized. Extremely difficult procedure.  Echocardiogram 04/20/2020: 1. Left ventricular ejection fraction, by estimation, is 40 to 45%. The left ventricle has mildly decreased function. The left ventricle  demonstrates regional wall motion abnormalities (see scoring  diagram/findings for description). Left ventricular  diastolic parameters are indeterminate. There is moderate hypokinesis of  the left ventricular, basal-mid inferior wall.  2. Right ventricular systolic function is normal. The right ventricular  size is normal. There is mildly elevated pulmonary artery systolic  pressure.  3. The mitral valve is grossly normal. Moderate to severe mitral valve  regurgitation.  4. Tricuspid valve regurgitation is moderate. RA-RV peak gradient 34  mmHg.  5. Compared to previous study on 11/20/2019, LVEF is lowered from 55-65%  to 40-45%, wall motion abnormalities are new, valvular regurgitation more  prominent.   EKG:   EKG 04/29/2020: Normal sinus rhythm with rate of 72 bpm, left axis deviation, left intrafascicular block.  Incomplete right bundle branch block.  Cannot exclude posterior infarct old.   Nonspecific T abnormality high lateral leads. Low voltage complexes.   EKG 12/19/2019: Sinus tachycardia at rate of 101 bpm, normal axis, no evidence of ischemia.  Low-voltage complexes.  No significant change from 11/15/2018.  Assessment     ICD-10-CM   1. Coronary artery disease  of native artery of native heart with stable angina pectoris (HCC)  I25.118 EKG 12-Lead    metoprolol succinate (TOPROL-XL) 25 MG 24 hr tablet  2. Primary hypertension  P37 Basic metabolic panel    CBC    TSH  3. Hypercholesteremia  E78.00     No orders of the defined types were placed in this encounter.   Medications Discontinued During This Encounter  Medication Reason  . lisinopril (ZESTRIL) 5 MG tablet Discontinued by provider  . metoprolol succinate (TOPROL-XL) 25 MG 24 hr tablet     Recommendations:   Cody Preston  is a 54 y.o.  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 Sisters present at bedside. He has CAD and h/o angioplasty with orbital atherectomy followed by stent to the proximal LAD on 12/18/2019. Present with ACS and NSTEMI on 04/19/2020, underwent urgent coronary angiogram and balloon angioplasty to occluded large circumflex, but unable to deliver the stent due to calcification.   I discussed the findings of the coronary angiography with the patient and his sister, will proceed with repeat relook angiography in view of very large vessel and high chance of restenosis.  He will need atherectomy.  Risks and benefits discussed with the patient.  Lipids are well controlled, blood pressure is low and his fatigue may be related to this, will discontinue Lisinopril and decrease metoprolol  succinate 12.5 mg daily.  I will see him back after the procedure.   [name], MD, [facility] 04/30/2020, 7:33 AM Office: [phone]

## 2020-04-30 ENCOUNTER — Telehealth: Payer: Self-pay

## 2020-05-08 ENCOUNTER — Ambulatory Visit: Payer: Medicare Other | Admitting: Cardiology

## 2020-05-20 ENCOUNTER — Observation Stay (HOSPITAL_COMMUNITY): Payer: Medicare Other | Admitting: Certified Registered Nurse Anesthetist

## 2020-05-20 ENCOUNTER — Encounter (HOSPITAL_COMMUNITY): Payer: Self-pay | Admitting: Cardiology

## 2020-05-20 ENCOUNTER — Observation Stay (HOSPITAL_COMMUNITY)
Admission: RE | Admit: 2020-05-20 | Discharge: 2020-05-21 | Disposition: A | Discharge status: Home / Self Care | Payer: Medicare Other | Attending: Cardiology | Admitting: Cardiology

## 2020-05-20 ENCOUNTER — Encounter (HOSPITAL_COMMUNITY)
Admission: RE | Disposition: A | Discharge status: Home / Self Care | Payer: Self-pay | Source: Home / Self Care | Attending: Cardiology

## 2020-05-20 ENCOUNTER — Ambulatory Visit (HOSPITAL_COMMUNITY)
Admission: RE | Disposition: A | Discharge status: Home / Self Care | Payer: Self-pay | Source: Home / Self Care | Attending: Cardiology

## 2020-05-20 ENCOUNTER — Other Ambulatory Visit: Payer: Self-pay

## 2020-05-20 DIAGNOSIS — I25118 Atherosclerotic heart disease of native coronary artery with other forms of angina pectoris: Secondary | ICD-10-CM | POA: Diagnosis not present

## 2020-05-20 DIAGNOSIS — I2510 Atherosclerotic heart disease of native coronary artery without angina pectoris: Secondary | ICD-10-CM | POA: Diagnosis not present

## 2020-05-20 DIAGNOSIS — Z8249 Family history of ischemic heart disease and other diseases of the circulatory system: Secondary | ICD-10-CM | POA: Insufficient documentation

## 2020-05-20 DIAGNOSIS — I2582 Chronic total occlusion of coronary artery: Secondary | ICD-10-CM | POA: Diagnosis not present

## 2020-05-20 DIAGNOSIS — Z7982 Long term (current) use of aspirin: Secondary | ICD-10-CM | POA: Insufficient documentation

## 2020-05-20 DIAGNOSIS — I25119 Atherosclerotic heart disease of native coronary artery with unspecified angina pectoris: Secondary | ICD-10-CM | POA: Diagnosis not present

## 2020-05-20 DIAGNOSIS — Z20822 Contact with and (suspected) exposure to covid-19: Secondary | ICD-10-CM | POA: Insufficient documentation

## 2020-05-20 DIAGNOSIS — T82898A Other specified complication of vascular prosthetic devices, implants and grafts, initial encounter: Secondary | ICD-10-CM | POA: Diagnosis not present

## 2020-05-20 DIAGNOSIS — T82897A Other specified complication of cardiac prosthetic devices, implants and grafts, initial encounter: Secondary | ICD-10-CM | POA: Diagnosis not present

## 2020-05-20 DIAGNOSIS — Z4582 Encounter for adjustment or removal of myringotomy device (stent) (tube): Secondary | ICD-10-CM | POA: Diagnosis not present

## 2020-05-20 DIAGNOSIS — Z86718 Personal history of other venous thrombosis and embolism: Secondary | ICD-10-CM | POA: Diagnosis not present

## 2020-05-20 DIAGNOSIS — I10 Essential (primary) hypertension: Secondary | ICD-10-CM | POA: Diagnosis not present

## 2020-05-20 DIAGNOSIS — X58XXXA Exposure to other specified factors, initial encounter: Secondary | ICD-10-CM | POA: Insufficient documentation

## 2020-05-20 DIAGNOSIS — E7800 Pure hypercholesterolemia, unspecified: Secondary | ICD-10-CM | POA: Insufficient documentation

## 2020-05-20 DIAGNOSIS — I2584 Coronary atherosclerosis due to calcified coronary lesion: Secondary | ICD-10-CM | POA: Diagnosis not present

## 2020-05-20 DIAGNOSIS — Z86711 Personal history of pulmonary embolism: Secondary | ICD-10-CM | POA: Diagnosis not present

## 2020-05-20 DIAGNOSIS — Z0184 Encounter for antibody response examination: Secondary | ICD-10-CM | POA: Diagnosis not present

## 2020-05-20 DIAGNOSIS — Z79899 Other long term (current) drug therapy: Secondary | ICD-10-CM | POA: Insufficient documentation

## 2020-05-20 DIAGNOSIS — I214 Non-ST elevation (NSTEMI) myocardial infarction: Secondary | ICD-10-CM | POA: Diagnosis not present

## 2020-05-20 DIAGNOSIS — I252 Old myocardial infarction: Secondary | ICD-10-CM | POA: Insufficient documentation

## 2020-05-20 DIAGNOSIS — Z9861 Coronary angioplasty status: Secondary | ICD-10-CM | POA: Diagnosis not present

## 2020-05-20 DIAGNOSIS — S55091A Other specified injury of ulnar artery at forearm level, right arm, initial encounter: Secondary | ICD-10-CM | POA: Diagnosis not present

## 2020-05-20 HISTORY — PX: STENT REMOVAL: SHX6421

## 2020-05-20 HISTORY — PX: AVGG REMOVAL: SHX5153

## 2020-05-20 HISTORY — PX: CORONARY BALLOON ANGIOPLASTY: CATH118233

## 2020-05-20 LAB — POCT ACTIVATED CLOTTING TIME
Activated Clotting Time: 334 seconds
Activated Clotting Time: 252 seconds
Activated Clotting Time: 268 seconds

## 2020-05-20 LAB — SAR COV2 SEROLOGY (COVID19)AB(IGG),IA: SARS-CoV-2 Ab, IgG: NONREACTIVE

## 2020-05-20 LAB — SARS CORONAVIRUS 2 BY RT PCR (HOSPITAL ORDER, PERFORMED IN ~~LOC~~ HOSPITAL LAB): SARS Coronavirus 2: NEGATIVE

## 2020-05-20 SURGERY — REMOVAL OF ARTERIOVENOUS GORETEX GRAFT (AVGG)
Anesthesia: General | Laterality: Right

## 2020-05-20 SURGERY — CORONARY BALLOON ANGIOPLASTY
Anesthesia: LOCAL

## 2020-05-20 MED ORDER — LIDOCAINE HCL (PF) 1 % IJ SOLN
INTRAMUSCULAR | Status: AC
  Filled 2020-05-20: qty 30

## 2020-05-20 MED ORDER — SODIUM CHLORIDE 0.9 % IV SOLN: 250.0000 mL | INTRAVENOUS | Status: DC | PRN

## 2020-05-20 MED ORDER — LIDOCAINE HCL (PF) 1 % IJ SOLN
INTRAMUSCULAR | Status: DC | PRN
  Administered 2020-05-20: 2 mL

## 2020-05-20 MED ORDER — SODIUM CHLORIDE 0.9% FLUSH: 3.0000 mL | INTRAVENOUS | Status: DC | PRN

## 2020-05-20 MED ORDER — SODIUM CHLORIDE 0.9 % WEIGHT BASED INFUSION
3.0000 mL/kg/h | INTRAVENOUS | Status: DC
  Administered 2020-05-20: 3 mL/kg/h via INTRAVENOUS

## 2020-05-20 MED ORDER — PANTOPRAZOLE SODIUM 40 MG PO TBEC
40.0000 mg | DELAYED_RELEASE_TABLET | Freq: Every day | ORAL | Status: DC
  Administered 2020-05-21: 40 mg via ORAL
  Filled 2020-05-20: qty 1

## 2020-05-20 MED ORDER — FENTANYL CITRATE (PF) 100 MCG/2ML IJ SOLN
INTRAMUSCULAR | Status: AC
  Filled 2020-05-20: qty 2

## 2020-05-20 MED ORDER — ASPIRIN 81 MG PO CHEW
81.0000 mg | CHEWABLE_TABLET | Freq: Every day | ORAL | Status: DC
  Administered 2020-05-21: 81 mg via ORAL
  Filled 2020-05-20: qty 1

## 2020-05-20 MED ORDER — PROTAMINE SULFATE 10 MG/ML IV SOLN
INTRAVENOUS | Status: DC | PRN
  Administered 2020-05-20: 30 mg via INTRAVENOUS

## 2020-05-20 MED ORDER — BISACODYL 10 MG RE SUPP: 10.0000 mg | Freq: Every day | RECTAL | Status: DC | PRN

## 2020-05-20 MED ORDER — SODIUM CHLORIDE 0.9% FLUSH
3.0000 mL | Freq: Two times a day (BID) | INTRAVENOUS | Status: DC
  Administered 2020-05-21: 3 mL via INTRAVENOUS

## 2020-05-20 MED ORDER — ACETAMINOPHEN 325 MG PO TABS: 650.0000 mg | ORAL_TABLET | ORAL | Status: DC | PRN

## 2020-05-20 MED ORDER — ONDANSETRON HCL 4 MG/2ML IJ SOLN: 4.0000 mg | Freq: Four times a day (QID) | INTRAMUSCULAR | Status: DC | PRN

## 2020-05-20 MED ORDER — FENTANYL CITRATE (PF) 250 MCG/5ML IJ SOLN
INTRAMUSCULAR | Status: AC
  Filled 2020-05-20: qty 5

## 2020-05-20 MED ORDER — HEPARIN SODIUM (PORCINE) 1000 UNIT/ML IJ SOLN
INTRAMUSCULAR | Status: AC
  Filled 2020-05-20: qty 1

## 2020-05-20 MED ORDER — HYDRALAZINE HCL 20 MG/ML IJ SOLN: 5.0000 mg | INTRAMUSCULAR | Status: AC | PRN

## 2020-05-20 MED ORDER — FENTANYL CITRATE (PF) 100 MCG/2ML IJ SOLN
INTRAMUSCULAR | Status: DC | PRN
  Administered 2020-05-20: 50 ug via INTRAVENOUS

## 2020-05-20 MED ORDER — SODIUM CHLORIDE 0.9% FLUSH: 3.0000 mL | Freq: Two times a day (BID) | INTRAVENOUS | Status: DC

## 2020-05-20 MED ORDER — DEXAMETHASONE SODIUM PHOSPHATE 10 MG/ML IJ SOLN
INTRAMUSCULAR | Status: DC | PRN
Start: 2020-05-20 — End: 2020-05-20
  Administered 2020-05-20: 5 mg via INTRAVENOUS

## 2020-05-20 MED ORDER — PROPOFOL 10 MG/ML IV BOLUS
INTRAVENOUS | Status: DC | PRN
  Administered 2020-05-20: 140 mg via INTRAVENOUS

## 2020-05-20 MED ORDER — MIDAZOLAM HCL 2 MG/2ML IJ SOLN
INTRAMUSCULAR | Status: AC
  Filled 2020-05-20: qty 2

## 2020-05-20 MED ORDER — HEPARIN (PORCINE) IN NACL 1000-0.9 UT/500ML-% IV SOLN
INTRAVENOUS | Status: DC | PRN
  Administered 2020-05-20 (×2): 500 mL

## 2020-05-20 MED ORDER — METOPROLOL SUCCINATE ER 50 MG PO TB24
50.0000 mg | ORAL_TABLET | Freq: Every day | ORAL | Status: DC
  Administered 2020-05-21: 50 mg via ORAL
  Filled 2020-05-20: qty 1

## 2020-05-20 MED ORDER — OXYCODONE-ACETAMINOPHEN 5-325 MG PO TABS
1.0000 | ORAL_TABLET | Freq: Four times a day (QID) | ORAL | Status: DC | PRN
  Administered 2020-05-20 – 2020-05-21 (×2): 2 via ORAL
  Filled 2020-05-20 (×2): qty 2

## 2020-05-20 MED ORDER — LACTATED RINGERS IV SOLN: INTRAVENOUS | Status: DC | PRN

## 2020-05-20 MED ORDER — SODIUM CHLORIDE 0.9 % WEIGHT BASED INFUSION: 1.0000 mL/kg/h | INTRAVENOUS | Status: AC

## 2020-05-20 MED ORDER — SODIUM CHLORIDE 0.9 % WEIGHT BASED INFUSION: 1.0000 mL/kg/h | INTRAVENOUS | Status: DC

## 2020-05-20 MED ORDER — LIDOCAINE 2% (20 MG/ML) 5 ML SYRINGE
INTRAMUSCULAR | Status: AC
  Filled 2020-05-20: qty 5

## 2020-05-20 MED ORDER — FENTANYL CITRATE (PF) 100 MCG/2ML IJ SOLN
INTRAMUSCULAR | Status: DC | PRN
  Administered 2020-05-20 (×2): 25 ug via INTRAVENOUS
  Administered 2020-05-20: 50 ug via INTRAVENOUS
  Administered 2020-05-20 (×2): 25 ug via INTRAVENOUS

## 2020-05-20 MED ORDER — IOHEXOL 350 MG/ML SOLN
INTRAVENOUS | Status: AC
  Filled 2020-05-20: qty 1

## 2020-05-20 MED ORDER — NITROGLYCERIN 1 MG/10 ML FOR IR/CATH LAB
INTRA_ARTERIAL | Status: AC
  Filled 2020-05-20: qty 10

## 2020-05-20 MED ORDER — PROPOFOL 10 MG/ML IV BOLUS
INTRAVENOUS | Status: AC
  Filled 2020-05-20: qty 20

## 2020-05-20 MED ORDER — VERAPAMIL HCL 2.5 MG/ML IV SOLN
INTRAVENOUS | Status: AC
  Filled 2020-05-20: qty 2

## 2020-05-20 MED ORDER — ONDANSETRON HCL 4 MG/2ML IJ SOLN
INTRAMUSCULAR | Status: AC
  Filled 2020-05-20: qty 2

## 2020-05-20 MED ORDER — ROCURONIUM BROMIDE 10 MG/ML (PF) SYRINGE
PREFILLED_SYRINGE | INTRAVENOUS | Status: AC
  Filled 2020-05-20: qty 10

## 2020-05-20 MED ORDER — CEFAZOLIN SODIUM-DEXTROSE 2-4 GM/100ML-% IV SOLN
INTRAVENOUS | Status: AC
  Filled 2020-05-20: qty 100

## 2020-05-20 MED ORDER — NITROGLYCERIN 0.4 MG SL SUBL: 0.4000 mg | SUBLINGUAL_TABLET | SUBLINGUAL | Status: DC | PRN

## 2020-05-20 MED ORDER — MIDAZOLAM HCL 2 MG/2ML IJ SOLN
INTRAMUSCULAR | Status: DC | PRN
  Administered 2020-05-20: 2 mg via INTRAVENOUS

## 2020-05-20 MED ORDER — HEPARIN SODIUM (PORCINE) 1000 UNIT/ML IJ SOLN
INTRAMUSCULAR | Status: DC | PRN
Start: 2020-05-20 — End: 2020-05-20
  Administered 2020-05-20: 6000 [IU] via INTRAVENOUS

## 2020-05-20 MED ORDER — EPHEDRINE SULFATE-NACL 50-0.9 MG/10ML-% IV SOSY
PREFILLED_SYRINGE | INTRAVENOUS | Status: DC | PRN
  Administered 2020-05-20: 10 mg via INTRAVENOUS

## 2020-05-20 MED ORDER — ACETAMINOPHEN 325 MG PO TABS: 650.0000 mg | ORAL_TABLET | Freq: Two times a day (BID) | ORAL | Status: DC | PRN

## 2020-05-20 MED ORDER — MIDAZOLAM HCL 2 MG/2ML IJ SOLN
INTRAMUSCULAR | Status: DC | PRN
  Administered 2020-05-20 (×2): 1 mg via INTRAVENOUS
  Administered 2020-05-20: 2 mg via INTRAVENOUS

## 2020-05-20 MED ORDER — CEFAZOLIN SODIUM-DEXTROSE 2-3 GM-%(50ML) IV SOLR
INTRAVENOUS | Status: DC | PRN
  Administered 2020-05-20: 2 g via INTRAVENOUS

## 2020-05-20 MED ORDER — ROSUVASTATIN CALCIUM 20 MG PO TABS
20.0000 mg | ORAL_TABLET | Freq: Every day | ORAL | Status: DC
  Administered 2020-05-21: 20 mg via ORAL
  Filled 2020-05-20: qty 1

## 2020-05-20 MED ORDER — ONDANSETRON HCL 4 MG/2ML IJ SOLN
INTRAMUSCULAR | Status: DC | PRN
  Administered 2020-05-20: 4 mg via INTRAVENOUS

## 2020-05-20 MED ORDER — PROTAMINE SULFATE 10 MG/ML IV SOLN
INTRAVENOUS | Status: AC
  Filled 2020-05-20: qty 5

## 2020-05-20 MED ORDER — ASPIRIN 81 MG PO CHEW: 81.0000 mg | CHEWABLE_TABLET | ORAL | Status: DC

## 2020-05-20 MED ORDER — MORPHINE SULFATE (PF) 2 MG/ML IV SOLN: 2.0000 mg | INTRAVENOUS | Status: DC | PRN

## 2020-05-20 MED ORDER — PAPAVERINE HCL 30 MG/ML IJ SOLN
INTRAMUSCULAR | Status: AC
  Filled 2020-05-20: qty 2

## 2020-05-20 MED ORDER — TICAGRELOR 90 MG PO TABS
90.0000 mg | ORAL_TABLET | Freq: Two times a day (BID) | ORAL | Status: AC
  Administered 2020-05-20: 90 mg via ORAL
  Filled 2020-05-20: qty 1

## 2020-05-20 MED ORDER — HEPARIN SODIUM (PORCINE) 1000 UNIT/ML IJ SOLN
INTRAMUSCULAR | Status: DC | PRN
  Administered 2020-05-20: 8000 [IU] via INTRAVENOUS
  Administered 2020-05-20: 2000 [IU] via INTRAVENOUS
  Administered 2020-05-20: 3000 [IU] via INTRAVENOUS

## 2020-05-20 MED ORDER — DEXAMETHASONE SODIUM PHOSPHATE 10 MG/ML IJ SOLN
INTRAMUSCULAR | Status: AC
  Filled 2020-05-20: qty 1

## 2020-05-20 MED ORDER — 0.9 % SODIUM CHLORIDE (POUR BTL) OPTIME
TOPICAL | Status: DC | PRN
  Administered 2020-05-20: 1000 mL

## 2020-05-20 MED ORDER — LIDOCAINE 2% (20 MG/ML) 5 ML SYRINGE
INTRAMUSCULAR | Status: DC | PRN
  Administered 2020-05-20: 40 mg via INTRAVENOUS

## 2020-05-20 MED ORDER — HEPARIN (PORCINE) IN NACL 1000-0.9 UT/500ML-% IV SOLN
INTRAVENOUS | Status: AC
  Filled 2020-05-20: qty 1000

## 2020-05-20 MED ORDER — FENTANYL CITRATE (PF) 100 MCG/2ML IJ SOLN
25.0000 ug | INTRAMUSCULAR | Status: DC | PRN
  Administered 2020-05-20 (×2): 25 ug via INTRAVENOUS

## 2020-05-20 MED ORDER — TICAGRELOR 90 MG PO TABS
90.0000 mg | ORAL_TABLET | Freq: Two times a day (BID) | ORAL | Status: DC
  Administered 2020-05-20 – 2020-05-21 (×2): 90 mg via ORAL
  Filled 2020-05-20 (×2): qty 1

## 2020-05-20 MED ORDER — SODIUM CHLORIDE 0.9 % IV SOLN
INTRAVENOUS | Status: DC | PRN
  Administered 2020-05-20: 500 mL

## 2020-05-20 MED ORDER — PAPAVERINE HCL 30 MG/ML IJ SOLN
INTRAMUSCULAR | Status: DC | PRN
  Administered 2020-05-20: 60 mg

## 2020-05-20 SURGICAL SUPPLY — 21 items
BALLN SAPPHIRE 1.0X15 (BALLOONS) ×2
BALLN SAPPHIRE 1.25X5 (BALLOONS) ×2
BALLN SAPPHIRE 2.5X15 (BALLOONS) ×2
BALLOON SAPPHIRE 1.0X15 (BALLOONS) IMPLANT
BALLOON SAPPHIRE 1.25X5 (BALLOONS) IMPLANT
BALLOON SAPPHIRE 2.5X15 (BALLOONS) IMPLANT
CATH CROSS OVER TEMPO 5F (CATHETERS) ×1 IMPLANT
CATH TELESCOPE 6F GEC (CATHETERS) ×1 IMPLANT
CATH VISTA GUIDE 6FR XB3.5 (CATHETERS) ×1 IMPLANT
DEVICE RAD COMP TR BAND LRG (VASCULAR PRODUCTS) ×1 IMPLANT
GLIDESHEATH SLEND A-KIT 6F 22G (SHEATH) ×1 IMPLANT
GUIDEWIRE INQWIRE 1.5J.035X260 (WIRE) IMPLANT
INQWIRE 1.5J .035X260CM (WIRE) ×2
KIT ENCORE 26 ADVANTAGE (KITS) ×1 IMPLANT
KIT HEART LEFT (KITS) ×2 IMPLANT
LUBRICANT VIPERSLIDE CORONARY (MISCELLANEOUS) ×1 IMPLANT
PACK CARDIAC CATHETERIZATION (CUSTOM PROCEDURE TRAY) ×2 IMPLANT
TRANSDUCER W/STOPCOCK (MISCELLANEOUS) ×2 IMPLANT
TUBING CIL FLEX 10 FLL-RA (TUBING) ×2 IMPLANT
WIRE COUGAR XT STRL 190CM (WIRE) ×1 IMPLANT
WIRE HI TORQ WHISPER MS 190CM (WIRE) ×1 IMPLANT

## 2020-05-20 SURGICAL SUPPLY — 38 items
ADH SKN CLS APL DERMABOND .7 (GAUZE/BANDAGES/DRESSINGS) ×1
CANISTER SUCT 3000ML PPV (MISCELLANEOUS) ×2 IMPLANT
CANNULA VESSEL 3MM 2 BLNT TIP (CANNULA) ×2 IMPLANT
CLIP VESOCCLUDE MED 6/CT (CLIP) ×2 IMPLANT
CLIP VESOCCLUDE SM WIDE 24/CT (CLIP) ×1 IMPLANT
CLIP VESOCCLUDE SM WIDE 6/CT (CLIP) ×2 IMPLANT
CNTNR URN SCR LID CUP LEK RST (MISCELLANEOUS) IMPLANT
CONT SPEC 4OZ STRL OR WHT (MISCELLANEOUS) ×2
COVER WAND RF STERILE (DRAPES) ×1 IMPLANT
DECANTER SPIKE VIAL GLASS SM (MISCELLANEOUS) IMPLANT
DERMABOND ADVANCED (GAUZE/BANDAGES/DRESSINGS) ×1
DERMABOND ADVANCED .7 DNX12 (GAUZE/BANDAGES/DRESSINGS) ×1 IMPLANT
DRAPE INCISE IOBAN 66X45 STRL (DRAPES) ×1 IMPLANT
ELECT REM PT RETURN 9FT ADLT (ELECTROSURGICAL) ×2
ELECTRODE REM PT RTRN 9FT ADLT (ELECTROSURGICAL) ×1 IMPLANT
GLOVE BIO SURGEON STRL SZ7.5 (GLOVE) ×2 IMPLANT
GLOVE BIOGEL PI IND STRL 8 (GLOVE) ×1 IMPLANT
GLOVE BIOGEL PI INDICATOR 8 (GLOVE) ×1
GOWN STRL REUS W/ TWL LRG LVL3 (GOWN DISPOSABLE) ×3 IMPLANT
GOWN STRL REUS W/TWL LRG LVL3 (GOWN DISPOSABLE) ×6
KIT BASIN OR (CUSTOM PROCEDURE TRAY) ×2 IMPLANT
KIT TURNOVER KIT B (KITS) ×2 IMPLANT
LOOP VESSEL MAXI BLUE (MISCELLANEOUS) ×1 IMPLANT
NDL HYPO 25GX1X1/2 BEV (NEEDLE) ×1 IMPLANT
NEEDLE HYPO 25GX1X1/2 BEV (NEEDLE) ×2 IMPLANT
NS IRRIG 1000ML POUR BTL (IV SOLUTION) ×2 IMPLANT
PACK CV ACCESS (CUSTOM PROCEDURE TRAY) ×2 IMPLANT
PACK UNIVERSAL I (CUSTOM PROCEDURE TRAY) ×1 IMPLANT
PAD ARMBOARD 7.5X6 YLW CONV (MISCELLANEOUS) ×4 IMPLANT
SPONGE SURGIFOAM ABS GEL 100 (HEMOSTASIS) IMPLANT
SUT PROLENE 6 0 BV (SUTURE) ×4 IMPLANT
SUT VIC AB 3-0 SH 27 (SUTURE) ×2
SUT VIC AB 3-0 SH 27X BRD (SUTURE) ×1 IMPLANT
SUT VICRYL 4-0 PS2 18IN ABS (SUTURE) ×1 IMPLANT
SYR 20ML LL LF (SYRINGE) ×1 IMPLANT
TOWEL GREEN STERILE (TOWEL DISPOSABLE) ×2 IMPLANT
UNDERPAD 30X36 HEAVY ABSORB (UNDERPADS AND DIAPERS) ×2 IMPLANT
WATER STERILE IRR 1000ML POUR (IV SOLUTION) ×2 IMPLANT

## 2020-05-20 NOTE — Transfer of Care (Signed)
Immediate Anesthesia Transfer of Care Note  Patient: Loel Dubonnet  Procedure(s) Performed: REMOVAL OF STENT RIGHT ULNAR ARTERY AND PRIMARY REPAIR OF ULNAR ARTERY (Right )  Patient Location: PACU  Anesthesia Type:General  Level of Consciousness: drowsy and patient cooperative  Airway & Oxygen Therapy: Patient Spontanous Breathing and Patient connected to nasal cannula oxygen  Post-op Assessment: Report given to RN, Post -op Vital signs reviewed and stable and Patient moving all extremities  Post vital signs: Reviewed and stable  Last Vitals:  Vitals Value Taken Time  BP 110/83 05/20/20 1740  Temp 36.6 C 05/20/20 1740  Pulse 80 05/20/20 1740  Resp 16 05/20/20 1740  SpO2 100 % 05/20/20 1740    Last Pain:  Vitals:   05/20/20 1329  TempSrc:   PainSc: 0-No pain      Patients Stated Pain Goal: 2 (08/18/84 2778)  Complications: No complications documented.

## 2020-05-20 NOTE — Consult Note (Addendum)
Hospital Consult  STENT IN THE RIGHT ULNAR ARTERY: This patient has an undeployed stent in the right ulnar artery as described below.  I think this will need to be removed given the risk of thrombosis.  I explained that we will likely be able to simply perform patch angioplasty of the artery but there is a small chance we would have to do bypass and take vein from the leg.  I have discussed the indications for the procedure and the potential complications with the patient and he is agreeable to proceed.  I have tried to contact his sister and have been unable to get an answer or leave a message.  A stat Covid test has been sent.  Deitra Mayo, MD Office: [phone]   Reason for Consult:  undeployed stent in right ulnar artery Requesting Physician:  Einar Gip MRN #:  [id number]  History of Present Illness: This is a 54 y.o. male pt of Dr. Einar Gip who underwent cardiac catheterization today.  He has hx of CAD with angioplasty with orbital atherectomy followed by stent to LAD on 12/18/2019.  He presented on 04/19/2020 with ACS and NSTEMI and underwent urgent coronary angiogram and balloon angioplasty to occluded large circumflex, but unable to deliver stent due to calcification.  He was brought back today for repeat coronary angiography in view of very large vessel and high chance of restenosis.  He will need atherectomy.    Pt just to pacu for cath lab.  RN reports that once the catheterization was underway, a non deployed stent was seen in the left main artery.  It was retrieved to the level of the right ulnar artery but could not get it to come any further.  Dr. Scot Dock is consulted for surgical management.   There is strong family hx of CAD.  Pt states he has hx of broken back and his legs hurt bc of this.  He states he gets cramping in his legs all the time.  He has shortness of breath with activity.   He denies ever having a stroke.    Pt is on Brilinta.  Last dose appears to be today.  The pt  is on a statin for cholesterol management.  The pt is on a daily aspirin.   Other AC:  Brilinta The pt is on CCB, BB for hypertension.   The pt is not diabetic.   Tobacco hx:  never  Past Medical History:  Diagnosis Date  . Back pain   . Coronary artery disease   . Hypercholesteremia   . Hyperlipidemia     Past Surgical History:  Procedure Laterality Date  . BACK SURGERY    . BACK SURGERY    . CLUB FOOT RELEASE    . CORONARY ATHERECTOMY N/A 12/18/2019   Procedure: CORONARY ATHERECTOMY;  Surgeon: Nigel Mormon, MD;  Location: [facility];  Service: Cardiovascular;  Laterality: N/A;  . CORONARY STENT INTERVENTION  12/18/2019  . CORONARY STENT INTERVENTION N/A 12/18/2019   Procedure: CORONARY STENT INTERVENTION;  Surgeon: Nigel Mormon, MD;  Location: [facility];  Service: Cardiovascular;  Laterality: N/A;  . CORONARY THROMBECTOMY N/A 04/19/2020   Procedure: Coronary Thrombectomy;  Surgeon: [name], MD;  Location: [facility];  Service: Cardiovascular;  Laterality: N/A;  . CORONARY/GRAFT ACUTE MI REVASCULARIZATION N/A 04/19/2020   Procedure: Coronary/Graft Acute MI Revascularization;  Surgeon: [name], MD;  Location: [facility];  Service: Cardiovascular;  Laterality: N/A;  . INTRAVASCULAR ULTRASOUND/IVUS N/A  12/18/2019   Procedure: Intravascular Ultrasound/IVUS;  Surgeon: Nigel Mormon, MD;  Location: [facility];  Service: Cardiovascular;  Laterality: N/A;  . LEFT HEART CATH AND CORONARY ANGIOGRAPHY N/A 12/18/2019   Procedure: LEFT HEART CATH AND CORONARY ANGIOGRAPHY;  Surgeon: Nigel Mormon, MD;  Location: [facility];  Service: Cardiovascular;  Laterality: N/A;  . LEFT HEART CATH AND CORONARY ANGIOGRAPHY N/A 04/19/2020   Procedure: LEFT HEART CATH AND CORONARY ANGIOGRAPHY;  Surgeon: [name], MD;  Location: [facility];  Service: Cardiovascular;  Laterality: N/A;  . SHUNT REPLACEMENT     at 6 months of  age    No Known Allergies  Prior to Admission medications   Medication Sig Start Date End Date Taking? Authorizing Provider  amLODipine (NORVASC) 5 MG tablet Take 5 mg by mouth daily. 05/01/20  Yes [provider]  aspirin 81 MG chewable tablet Chew 1 tablet (81 mg total) by mouth daily. 04/22/20  Yes Patwardhan, Manish J, MD  furosemide (LASIX) 20 MG tablet Take 1 tablet (20 mg total) by mouth daily as needed. Daily as needed for leg edema Patient taking differently: Take 20 mg by mouth daily.  04/21/20  Yes Patwardhan, Manish J, MD  metoprolol succinate (TOPROL-XL) 50 MG 24 hr tablet Take 50 mg by mouth daily. 05/01/20  Yes [provider]  nitroGLYCERIN (NITROSTAT) 0.4 MG SL tablet Place 1 tablet (0.4 mg total) under the tongue every 5 (five) minutes as needed for chest pain. Patient taking differently: Place 0.4 mg under the tongue every 5 (five) minutes x 3 doses as needed for chest pain.  04/21/20  Yes Patwardhan, Manish J, MD  pantoprazole (PROTONIX) 40 MG tablet Take 1 tablet (40 mg total) by mouth daily. 04/10/20 04/10/21 Yes Patwardhan, Manish J, MD  rosuvastatin (CRESTOR) 20 MG tablet TAKE 1 TABLET (20 MG TOTAL) BY MOUTH DAILY. 02/20/20 05/20/20 Yes Miquel Dunn, NP  ticagrelor (BRILINTA) 90 MG TABS tablet Take 1 tablet (90 mg total) by mouth 2 (two) times daily. 04/22/20  Yes Patwardhan, Reynold Bowen, MD  acetaminophen (TYLENOL) 325 MG tablet Take 2 tablets (650 mg total) by mouth every 12 (twelve) hours as needed for moderate pain. 04/21/20   Patwardhan, Reynold Bowen, MD    Social History   Socioeconomic History  . Marital status: Single    Spouse name: Not on file  . Number of children: 0  . Years of education: Not on file  . Highest education level: Not on file  Occupational History  . Occupation: Retired  Tobacco Use  . Smoking status: Never Smoker  . Smokeless tobacco: Never Used  Vaping Use  . Vaping Use: Never used  Substance and Sexual Activity  . Alcohol  use: Not Currently  . Drug use: No  . Sexual activity: Not on file  Other Topics Concern  . Not on file  Social History Narrative  . Not on file   Social Determinants of Health   Financial Resource Strain:   . Difficulty of Paying Living Expenses:   Food Insecurity:   . Worried About Charity fundraiser in the Last Year:   . Arboriculturist in the Last Year:   Transportation Needs:   . Film/video editor (Medical):   Marland Kitchen Lack of Transportation (Non-Medical):   Physical Activity:   . Days of Exercise per Week:   . Minutes of Exercise per Session:   Stress:   . Feeling of Stress :  Social Connections:   . Frequency of Communication with Friends and Family:   . Frequency of Social Gatherings with Friends and Family:   . Attends Religious Services:   . Active Member of Clubs or Organizations:   . Attends Archivist Meetings:   Marland Kitchen Marital Status:   Intimate Partner Violence:   . Fear of Current or Ex-Partner:   . Emotionally Abused:   Marland Kitchen Physically Abused:   . Sexually Abused:     Family History  Problem Relation Age of Onset  . Allergies Sister   . Heart attack Mother   . Arthritis Mother   . Lung cancer Mother   . Arthritis Sister   . Alzheimer's disease Father   . Cancer Sister   . Heart attack Brother   . Hyperlipidemia Brother     ROS: [x]  Positive   [ ]  Negative   [ ]  All sytems reviewed and are negative  Cardiac: [x]  chest pain/pressure []  palpitations []  SOB lying flat [x]  DOE  Vascular: [x]  cramping in legs  []  non-healing ulcers  Pulmonary: []  productive cough []  asthma/wheezing []  home O2  Neurologic: []  hx of CVA []  mini stroke  Hematologic: []  hx of cancer []  bleeding problems  Endocrine:   []  diabetes []  thyroid disease  GI []  vomiting blood []  blood in stool  GU: []  CKD/renal failure []  HD--[]  M/W/F or []  T/T/S  Psychiatric: []  anxiety []  depression  Musculoskeletal: []  arthritis []  joint  pain  Integumentary: []  rashes []  ulcers  Constitutional: []  fever  []  chills  Physical Examination  Vitals:   05/20/20 0933 05/20/20 1024  BP: 111/79   Pulse: 79   Resp: 17   Temp: 97.9 F (36.6 C)   SpO2: 100% 100%   Body mass index is 22.24 kg/m.  General:  WDWN in NAD Gait: Not observed HENT: WNL, normocephalic Pulmonary: normal non-labored breathing, without Rales, rhonchi,  wheezing Cardiac: regular Abdomen:  soft, NT/ND, no masses Skin: without rashes Vascular Exam/Pulses:  Right Left  Radial Band on right wrist 2+ (normal)  Femoral 2+ (normal) Unable to palpate due to pt position  Popliteal Unable to palpate Unable to palpate  PT Biphasic doppler signal Biphasic doppler signal   Extremities: without ischemic changes, without Gangrene , without cellulitis; without open wounds;  Musculoskeletal: no muscle wasting or atrophy  Neurologic: A&O X 3;  No focal weakness or paresthesias are detected; speech is fluent/normal Psychiatric:  The pt has Normal affect.  CBC    Component Value Date/Time   WBC 9.1 04/22/2020 0349   RBC 3.81 (L) 04/22/2020 0349   HGB 11.0 (L) 04/22/2020 0349   HGB 13.7 12/14/2019 1318   HCT 33.1 (L) 04/22/2020 0349   HCT 40.2 12/14/2019 1318   PLT 259 04/22/2020 0349   PLT 360 12/14/2019 1318   MCV 86.9 04/22/2020 0349   MCV 85 12/14/2019 1318   MCH 28.9 04/22/2020 0349   MCHC 33.2 04/22/2020 0349   RDW 13.0 04/22/2020 0349   RDW 13.5 12/14/2019 1318    BMET    Component Value Date/Time   NA 134 (L) 04/20/2020 1058   NA 134 12/14/2019 1318   K 4.2 04/21/2020 0830   CL 104 04/20/2020 1058   CO2 22 04/20/2020 1058   GLUCOSE 149 (H) 04/20/2020 1058   BUN 15 04/20/2020 1058   BUN 14 12/14/2019 1318   CREATININE 0.94 04/20/2020 1058   CALCIUM 8.3 (L) 04/20/2020 1058   GFRNONAA >60 04/20/2020 1058  GFRAA >60 04/20/2020 1058    COAGS: Lab Results  Component Value Date   INR 1.04 10/02/2012     Non-Invasive Vascular  Imaging:   none   ASSESSMENT/PLAN: This is a 54 y.o. male who underwent cardiac catheterization today who was found to have an un-deployed stent in the left main artery and was retrieved to level of right ulnar artery. Dr. Scot Dock consulted for surgical management.  -pt will be taken to the operating room urgently for retrieval of stent and patch angioplasty.  He did discuss with pt should he need a bypass in the arm, we would harvest saphenous vein for this.  He agrees to proceed.  Dr. Scot Dock to speak with pt's sister. -of note, pt is on Brilinta and per paper chart, had a dose today.  Leontine Locket, PA-C [facility] [phone]

## 2020-05-20 NOTE — Interval H&P Note (Signed)
History and Physical Interval Note:  05/20/2020 10:27 AM  Cody Preston  has presented today for surgery, with the diagnosis of cad.  The various methods of treatment have been discussed with the patient and family. After consideration of risks, benefits and other options for treatment, the patient has consented to  Procedure(s): CORONARY STENT INTERVENTION (N/A) as a surgical intervention.  The patient's history has been reviewed, patient examined, no change in status, stable for surgery.  I have reviewed the patient's chart and labs.  Questions were answered to the patient's satisfaction.    Cath Lab Visit (complete for each Cath Lab visit)  Clinical Evaluation Leading to the Procedure:   ACS: Yes.    Non-ACS:    Anginal Classification: CCS IV  Anti-ischemic medical therapy: Maximal Therapy (2 or more classes of medications)  Non-Invasive Test Results: No non-invasive testing performed  Prior CABG: No previous CABG  Cody Preston

## 2020-05-20 NOTE — Interval H&P Note (Signed)
History and Physical Interval Note:  05/20/2020 10:27 AM  Cody Preston  has presented today for surgery, with the diagnosis of cad.  The various methods of treatment have been discussed with the patient and family. After consideration of risks, benefits and other options for treatment, the patient has consented to  Procedure(s): CORONARY STENT INTERVENTION (N/A) as a surgical intervention.  The patient's history has been reviewed, patient examined, no change in status, stable for surgery.  I have reviewed the patient's chart and labs.  Questions were answered to the patient's satisfaction.     [name]

## 2020-05-20 NOTE — Anesthesia Procedure Notes (Signed)
Procedure Name: LMA Insertion Date/Time: 05/20/2020 4:21 PM Performed by: Moshe Salisbury, CRNA Pre-anesthesia Checklist: Patient identified, Emergency Drugs available, Suction available and Patient being monitored Patient Re-evaluated:Patient Re-evaluated prior to induction Oxygen Delivery Method: Circle System Utilized Preoxygenation: Pre-oxygenation with 100% oxygen Induction Type: IV induction Ventilation: Mask ventilation without difficulty LMA: LMA inserted LMA Size: 5.0 Number of attempts: 1 Placement Confirmation: positive ETCO2 Tube secured with: Tape Dental Injury: Teeth and Oropharynx as per pre-operative assessment

## 2020-05-20 NOTE — Anesthesia Preprocedure Evaluation (Addendum)
Anesthesia Evaluation  Patient identified by MRN, date of birth, ID band Patient awake    Reviewed: Allergy & Precautions, NPO status , Patient's Chart, lab work & pertinent test results  Airway Mallampati: III  TM Distance: >3 FB Neck ROM: Limited    Dental  (+) Poor Dentition   Pulmonary    breath sounds clear to auscultation       Cardiovascular + angina + CAD and + Past MI   Rhythm:Regular Rate:Normal     Neuro/Psych    GI/Hepatic   Endo/Other    Renal/GU      Musculoskeletal   Abdominal   Peds  Hematology   Anesthesia Other Findings   Reproductive/Obstetrics                            Anesthesia Physical Anesthesia Plan  ASA: III  Anesthesia Plan: General   Post-op Pain Management:    Induction:   PONV Risk Score and Plan: 2 and Ondansetron, Dexamethasone and Midazolam  Airway Management Planned: LMA  Additional Equipment: None  Intra-op Plan:   Post-operative Plan: Extubation in OR  Informed Consent: I have reviewed the patients History and Physical, chart, labs and discussed the procedure including the risks, benefits and alternatives for the proposed anesthesia with the patient or authorized representative who has indicated his/her understanding and acceptance.       Plan Discussed with: CRNA, Anesthesiologist and Surgeon  Anesthesia Plan Comments:        Anesthesia Quick Evaluation

## 2020-05-20 NOTE — Progress Notes (Signed)
Patient's sister, Daiva Nakayama phone number: [phone]

## 2020-05-20 NOTE — Progress Notes (Signed)
I tried to reach out to his sister and also to his brother, there was no response.  The number for his brother is erroneous.  I wanted to update them.   [name], MD, [facility] 05/20/2020, 1:57 PM Office: [phone]

## 2020-05-20 NOTE — Op Note (Signed)
    NAME: Cody Preston    MRN: [id number] DOB: Jan 07, 1966    DATE OF OPERATION: 05/20/2020  PREOP DIAGNOSIS:    Nondeployed coronary stent and right ulnar artery  POSTOP DIAGNOSIS:    Same  PROCEDURE:    Removal of coronary stent from right ulnar artery Primary closure right ulnar artery  SURGEON: Judeth Cornfield. Scot Dock, MD  ASSIST: Leontine Locket, PA  ANESTHESIA: General  EBL: Minimal  INDICATIONS:    Cody Preston is a 54 y.o. male who had a stent retrieved from the coronary artery that came loose from the sheath and the brachial artery and embolized into the ulnar artery.  I recommended exploration.  FINDINGS:   The stent was removed via a transverse arteriotomy proximal to the stent.  The artery was then closed primarily.  TECHNIQUE:   The patient was taken to the operating room and received a general anesthetic.  The right arm was prepped and draped in usual sterile fashion.  The location of the stent was identified using duplex scan.  A longitudinal incision was made over the artery and then this was extended across the antecubital to expose the brachial artery proximally.  The brachial artery, radial artery, and proximal ulnar artery were all controlled.  I then dissected lower down onto the ulnar artery until I could feel the stent.  The patient was heparinized.  A transverse arteriotomy was made just above the stent after the proximal artery was clamped.  I was able to reach down into the artery and retrieve the stent with a small hemostat.  The distal clamp was then placed after flushing with papaverine.  The arteriotomy was closed with interrupted 6-0 Prolene's.  The completion was a good radial and ulnar signal with a Doppler.  Hemostasis was obtained the wound.  The wound was closed with a deep layer of 3-0 Vicryl to skin closed with 4-0 Vicryl.  Dermabond was applied.  The patient tolerated the procedure well was transferred to recovery room in stable condition.  All  needle and sponge counts were correct.  Given the complexity of the case a first assistant was necessary in order to expedient the procedure and safely perform the technical aspects of the operation.  Deitra Mayo, MD, FACS [facility]  DATE OF DICTATION:   05/20/2020

## 2020-05-20 NOTE — OR Nursing (Signed)
TR Band inflated with 10 ml air at 1638 in operating room per [facility]

## 2020-05-21 ENCOUNTER — Encounter (HOSPITAL_COMMUNITY): Payer: Self-pay | Admitting: Cardiology

## 2020-05-21 ENCOUNTER — Telehealth: Payer: Self-pay

## 2020-05-21 ENCOUNTER — Other Ambulatory Visit: Payer: Self-pay

## 2020-05-21 DIAGNOSIS — I2510 Atherosclerotic heart disease of native coronary artery without angina pectoris: Secondary | ICD-10-CM | POA: Diagnosis not present

## 2020-05-21 DIAGNOSIS — I25119 Atherosclerotic heart disease of native coronary artery with unspecified angina pectoris: Secondary | ICD-10-CM | POA: Diagnosis not present

## 2020-05-21 DIAGNOSIS — Z20822 Contact with and (suspected) exposure to covid-19: Secondary | ICD-10-CM | POA: Diagnosis not present

## 2020-05-21 DIAGNOSIS — Z7982 Long term (current) use of aspirin: Secondary | ICD-10-CM | POA: Diagnosis not present

## 2020-05-21 DIAGNOSIS — I252 Old myocardial infarction: Secondary | ICD-10-CM | POA: Diagnosis not present

## 2020-05-21 DIAGNOSIS — I2582 Chronic total occlusion of coronary artery: Secondary | ICD-10-CM | POA: Diagnosis not present

## 2020-05-21 DIAGNOSIS — Z86711 Personal history of pulmonary embolism: Secondary | ICD-10-CM | POA: Diagnosis not present

## 2020-05-21 DIAGNOSIS — Z0184 Encounter for antibody response examination: Secondary | ICD-10-CM | POA: Diagnosis not present

## 2020-05-21 DIAGNOSIS — T82897A Other specified complication of cardiac prosthetic devices, implants and grafts, initial encounter: Secondary | ICD-10-CM | POA: Diagnosis not present

## 2020-05-21 DIAGNOSIS — Z9861 Coronary angioplasty status: Secondary | ICD-10-CM | POA: Diagnosis not present

## 2020-05-21 DIAGNOSIS — Z86718 Personal history of other venous thrombosis and embolism: Secondary | ICD-10-CM | POA: Diagnosis not present

## 2020-05-21 DIAGNOSIS — I2584 Coronary atherosclerosis due to calcified coronary lesion: Secondary | ICD-10-CM | POA: Diagnosis not present

## 2020-05-21 DIAGNOSIS — I25118 Atherosclerotic heart disease of native coronary artery with other forms of angina pectoris: Secondary | ICD-10-CM | POA: Diagnosis not present

## 2020-05-21 DIAGNOSIS — I10 Essential (primary) hypertension: Secondary | ICD-10-CM | POA: Diagnosis not present

## 2020-05-21 DIAGNOSIS — E7800 Pure hypercholesterolemia, unspecified: Secondary | ICD-10-CM | POA: Diagnosis not present

## 2020-05-21 DIAGNOSIS — Z8249 Family history of ischemic heart disease and other diseases of the circulatory system: Secondary | ICD-10-CM | POA: Diagnosis not present

## 2020-05-21 DIAGNOSIS — Z79899 Other long term (current) drug therapy: Secondary | ICD-10-CM | POA: Diagnosis not present

## 2020-05-21 LAB — CBC
HCT: 31.3 % — ABNORMAL LOW (ref 39.0–52.0)
Hemoglobin: 10.2 g/dL — ABNORMAL LOW (ref 13.0–17.0)
MCH: 28.3 pg (ref 26.0–34.0)
MCHC: 32.6 g/dL (ref 30.0–36.0)
MCV: 86.9 fL (ref 80.0–100.0)
Platelets: 279 10*3/uL (ref 150–400)
RBC: 3.6 MIL/uL — ABNORMAL LOW (ref 4.22–5.81)
RDW: 12.7 % (ref 11.5–15.5)
WBC: 6.4 10*3/uL (ref 4.0–10.5)
nRBC: 0 % (ref 0.0–0.2)

## 2020-05-21 LAB — BASIC METABOLIC PANEL
Anion gap: 10 (ref 5–15)
BUN: 13 mg/dL (ref 6–20)
CO2: 24 mmol/L (ref 22–32)
Calcium: 9 mg/dL (ref 8.9–10.3)
Chloride: 99 mmol/L (ref 98–111)
Creatinine, Ser: 1.02 mg/dL (ref 0.61–1.24)
GFR calc Af Amer: >60 mL/min (ref >60)
GFR calc non Af Amer: >60 mL/min (ref >60)
Glucose, Bld: 160 mg/dL — ABNORMAL HIGH (ref 70–99)
Potassium: 3.6 mmol/L (ref 3.5–5.1)
Sodium: 133 mmol/L — ABNORMAL LOW (ref 135–145)

## 2020-05-21 MED FILL — Verapamil HCl IV Soln 2.5 MG/ML: INTRAVENOUS | Qty: 2 | Status: AC

## 2020-05-21 MED FILL — Nitroglycerin IV Soln 100 MCG/ML in D5W: INTRA_ARTERIAL | Qty: 10 | Status: AC

## 2020-05-21 NOTE — Progress Notes (Signed)
   VASCULAR SURGERY ASSESSMENT & PLAN:   POD 1 REMOVAL CORONARY STENT FROM RIGHT ULNAR ARTERY: Palpable right radial pulse and biphasic ulnar signal with the Doppler.  His incision looks fine.  Okay for discharge from my standpoint.  He will call if he has any problems with the incision.  He is on aspirin, a statin, and Brilinta.  SUBJECTIVE:   No specific complaints this morning.  He wants to go home.  PHYSICAL EXAM:   Vitals:   05/20/20 2032 05/21/20 0106 05/21/20 0131 05/21/20 0500  BP: 102/72     Pulse:  66  80  Resp:      Temp: 98 F (36.7 C)  97.7 F (36.5 C) 97.8 F (36.6 C)  TempSrc: Oral Oral Oral Oral  SpO2:    96%  Weight:      Height:       His incision looks fine. Palpable right radial pulse. Biphasic ulnar signal with the Doppler.  LABS:   Lab Results  Component Value Date   WBC 6.4 05/21/2020   HGB 10.2 (L) 05/21/2020   HCT 31.3 (L) 05/21/2020   MCV 86.9 05/21/2020   PLT 279 05/21/2020   Lab Results  Component Value Date   CREATININE 1.02 05/21/2020    PROBLEM LIST:    Principal Problem:   Coronary artery disease involving native coronary artery of native heart with angina pectoris (HCC) Active Problems:   CAD (coronary artery disease)   CURRENT MEDS:   . aspirin  81 mg Oral Daily  . metoprolol succinate  50 mg Oral Daily  . pantoprazole  40 mg Oral Daily  . rosuvastatin  20 mg Oral Daily  . sodium chloride flush  3 mL Intravenous Q12H  . sodium chloride flush  3 mL Intravenous Q12H  . ticagrelor  90 mg Oral BID    Deitra Mayo Office: [phone] 05/21/2020

## 2020-05-21 NOTE — Telephone Encounter (Signed)
Per Dr. Einar Gip pt needs to be seen on Monday 05/26/2020. Pt aware and will be calling back to confirm time he can come in due to transportation

## 2020-05-21 NOTE — Discharge Summary (Signed)
Physician Discharge Summary  Patient ID: Cody Preston MRN: [id number] DOB/AGE: Jan 12, 1966 54 y.o. 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  Ulnar arteriotomy 05/20/2020: A transverse arteriotomy was made just above the stent after the proximal artery was clamped.  I was able to reach down into the artery and retrieve the stent with a small hemostat.   Radiology: CARDIAC CATHETERIZATION  Addendum Date: 05/23/2020   Procedure performed: PTCA and angioplasty of the circumflex coronary artery and left main coronary artery, snaring of the previous stent that was undeployed in the left main and circumflex coronary artery. I was able to successfully use a 2.5 mm balloon to pass through the stent and inflated the balloon outside the stent and gently pulled it into the proximal segment of the circumflex and this made the stent to move into the guide.  I felt comfortable to pull the entire system outside of the body but lost the stent at the radial artery level.  I had lost the wire position, when I tried to reposition the wire, the stent moved proximal towards the brachial artery and eventually got lodged in the ulnar artery at the bifurcation of interosseous artery and ulnar artery. I then obtained consultation from vascular surgery, Dr. Shanon Rosser, at this point the best option was to explore surgically the ulnar artery and snare the stent outside.  Proximal segment of the ulnar artery was very large and should be able to patch angioplasty easily with long-term  durable result.  Palmar arch angiography was performed, half of the palmar arch was formed by the ulnar artery and radial artery was dominant. Recommendation: Patient will be scheduled for surgical exploration of the right antecubital fossa and a very large ulnar artery that has stent lodged should be able to be easily be snared and patch angioplasty performed with long-term durable results.   Result Date: 05/20/2020 Procedure performed: PTCA and angioplasty of the circumflex coronary artery and left main coronary artery, snaring of the previous stent that was deployed in the left main and circumflex coronary artery. I was able to successfully use a 2.5 mm balloon to pass through the stent and inflated the balloon outside the stent and gently pulled it into the proximal segment of the circumflex and this made the stent to move into the guide.  I felt comfortable to pull the entire system outside of the body but lost the stent at the radial artery level.  I had lost the wire position, when I tried to reposition the wire, the stent moved proximal towards the brachial artery and eventually got lodged in the ulnar artery at the bifurcation of interosseous artery and ulnar artery. I then obtained consultation from vascular surgery, Dr. Shanon Rosser, at this point the best option was to explore surgically the ulnar artery and snare the stent outside.  Proximal segment of the ulnar artery was very large and should be able to patch angioplasty easily with long-term durable result.  Palmar arch angiography was performed, half of the palmar arch was formed by the ulnar artery and radial artery was dominant. Recommendation: Patient will be scheduled for surgical exploration of  the right antecubital fossa and a very large ulnar artery that has stent lodged should be able to be easily be snared and patch angioplasty performed with long-term durable results.    Hospital Course: Cody Preston is a 54 y.o. 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 Sisters present at bedside. He has CAD and h/o angioplasty with orbital atherectomy followed by stent to the proximal LAD on 12/18/2019. Present with ACS and NSTEMI on 04/19/2020, underwent urgent coronary angiogram and balloon angioplasty to occluded large circumflex, but unable to deliver the stent due to calcification.  Due to incompleteness of the procedure, as it is a very large vessel, I felt that we need to have a relook and possibly perform atherectomy followed by restenting of the circumflex coronary artery.  But on further review during angiography, I realized there was a undeployed stent in the left main and circumflex coronary artery.  At this point I felt that I should try to snare the stent out of the body as I was concerned about deploying it at the site with potential for underexpansion in view of heavy calcification and lack of atherectomy.  With great difficulty I was able to successfully retrieve the stent and while attempting to bring it to the radial artery and to exteriorize the stent, I did lose a stent in the brachial artery which migrated into the ulnar artery but felt that patient was very stable and a very small incision and retrieval of the stent is possible surgically instead of a complex retrieval process and potential for injury to the artery.  Dr. Doren Custard was consulted and will graciously take the patient the same day and successfully underwent stent retrieval as above.  Patient the following day felt well, had good bounding upper extremity pulses bilaterally, felt stable for discharge.  I will see him back in the office in a  week.  I discussed the findings of the cardiac catheterization with patient's sister and also with the patient and explained to them regarding heavy calcification, poor visibility and also the initial principal angiography was performed on emergent basis when he presented with near cardiogenic shock, bradycardia and NSTEMI and was performed in the middle of the night.  Extensive calcification was evident in the left main, circumflex and LAD and it is comprehendible that the stent loss was not realized.  It was also very long procedure previously.  Recommendations on discharge: I will review the angiograms again with my colleagues, probably continue medical therapy, if he has restenosis in the circumflex then consider angioplasty.  The angioplasty site appeared fairly well preserved.  He will continue with dual antiplatelet therapy.  Discharge Exam: Vitals with BMI 05/21/2020 05/21/2020 05/21/2020  Height - - -  Weight - - -  BMI - - -  Systolic 782 956 -  Diastolic 56 74 -  Pulse 89 101 80     Physical Exam Neck:     Thyroid: No thyromegaly.  Cardiovascular:     Rate and Rhythm: Normal rate and regular rhythm.     Pulses: Intact distal pulses.     Heart sounds: Normal heart sounds. No murmur heard.  No gallop.  Comments: No leg edema, no JVD. Pulmonary:     Effort: Pulmonary effort is normal.     Breath sounds: Normal breath sounds.  Abdominal:     General: Bowel sounds are normal.     Palpations: Abdomen is soft.  Musculoskeletal:     Cervical back: Neck supple.  Skin:    General: Skin is warm and dry.    Labs:   Lab Results  Component Value Date   WBC 6.4 05/21/2020   HGB 10.2 (L) 05/21/2020   HCT 31.3 (L) 05/21/2020   MCV 86.9 05/21/2020   PLT 279 05/21/2020    Recent Labs  Lab 05/21/20 0427  NA 133*  K 3.6  CL 99  CO2 24  BUN 13  CREATININE 1.02  CALCIUM 9.0  GLUCOSE 160*    Lipid Panel     Component Value Date/Time   CHOL 128 04/21/2020 0320    TRIG 69 04/21/2020 0320   HDL 62 04/21/2020 0320   CHOLHDL 2.1 04/21/2020 0320   VLDL 14 04/21/2020 0320   LDLCALC 52 04/21/2020 0320    BNP (last 3 results) Recent Labs    04/20/20 1058  BNP 378.8*    HEMOGLOBIN A1C Lab Results  Component Value Date   HGBA1C 5.9 (H) 04/21/2020   MPG 122.63 04/21/2020   FOLLOW UP PLANS AND APPOINTMENTS  Allergies as of 05/21/2020   No Known Allergies     Medication List    STOP taking these medications   amLODipine 5 MG tablet Commonly known as: NORVASC     TAKE these medications   acetaminophen 325 MG tablet Commonly known as: TYLENOL Take 2 tablets (650 mg total) by mouth every 12 (twelve) hours as needed for moderate pain.   aspirin 81 MG chewable tablet Chew 1 tablet (81 mg total) by mouth daily.   furosemide 20 MG tablet Commonly known as: LASIX Take 1 tablet (20 mg total) by mouth daily as needed. Daily as needed for leg edema What changed:   when to take this  additional instructions   metoprolol succinate 50 MG 24 hr tablet Commonly known as: TOPROL-XL Take 50 mg by mouth daily.   nitroGLYCERIN 0.4 MG SL tablet Commonly known as: NITROSTAT Place 1 tablet (0.4 mg total) under the tongue every 5 (five) minutes as needed for chest pain. What changed: when to take this   pantoprazole 40 MG tablet Commonly known as: Protonix Take 1 tablet (40 mg total) by mouth daily.   rosuvastatin 20 MG tablet Commonly known as: CRESTOR TAKE 1 TABLET (20 MG TOTAL) BY MOUTH DAILY.   ticagrelor 90 MG Tabs tablet Commonly known as: BRILINTA Take 1 tablet (90 mg total) by mouth 2 (two) times daily.       Follow-up Information    [name], MD Follow up on 05/26/2020.   Specialty: Cardiology Why: Our office will call with the time of the visit. Bring all medications Contact information: [place] [address] [phone]                [name], MD, [facility] 05/23/2020, 10:11 AM Office:  [phone]

## 2020-05-21 NOTE — Progress Notes (Signed)
CARDIAC REHAB PHASE I   Offered to walk with pt. Pt states he is going home and has been walking fine. Pt states falls at home, encouraged use of walker as pt received one after his last hospital stay. Stressed importance of med compliance. Reviewed site care, restrictions and exercise guidelines. Encouraged pt to keep his dogs away from his incision site. Pt previously referred to [facility] [place].   [id number] Rufina Falco, RN BSN 05/21/2020 10:24 AM

## 2020-05-21 NOTE — Anesthesia Postprocedure Evaluation (Signed)
Anesthesia Post Note  Patient: Cody Preston  Procedure(s) Performed: REMOVAL OF STENT RIGHT ULNAR ARTERY AND PRIMARY REPAIR OF ULNAR ARTERY (Right )     Patient location during evaluation: PACU Anesthesia Type: General Level of consciousness: awake and alert Pain management: pain level controlled Vital Signs Assessment: post-procedure vital signs reviewed and stable Respiratory status: spontaneous breathing, nonlabored ventilation, respiratory function stable and patient connected to nasal cannula oxygen Cardiovascular status: blood pressure returned to baseline and stable Postop Assessment: no apparent nausea or vomiting Anesthetic complications: no   No complications documented.  Last Vitals:  Vitals:   05/21/20 0749 05/21/20 1130  BP: 104/74 (!) 105/56  Pulse: 101 89  Resp: 15 18  Temp: 36.8 C 36.8 C  SpO2: 95% 100%    Last Pain:  Vitals:   05/21/20 1130  TempSrc: Oral  PainSc:                  Rockford Leinen COKER

## 2020-05-22 LAB — SURGICAL PATHOLOGY

## 2020-05-22 NOTE — Telephone Encounter (Signed)
[name] for pt to sch his ov as he has not called back  -Cody Preston

## 2020-05-22 NOTE — Telephone Encounter (Signed)
FYI: I have held slots on Monday 05/26/2020 and Tuesday 05/27/2020 for pt when he calls back

## 2020-05-23 ENCOUNTER — Encounter (HOSPITAL_COMMUNITY): Payer: Self-pay | Admitting: Cardiology

## 2020-05-26 NOTE — Telephone Encounter (Signed)
Please note Cody Preston has been reached out many times to be scheduled for today 05/26/20. Patient has not returned our calls.

## 2020-05-26 NOTE — Telephone Encounter (Signed)
Pt called back and scheduled for 05/27/2020 @ 11:45 am

## 2020-05-27 ENCOUNTER — Ambulatory Visit: Payer: Medicare Other | Admitting: Cardiology

## 2020-05-27 ENCOUNTER — Encounter: Payer: Self-pay | Admitting: Cardiology

## 2020-05-27 ENCOUNTER — Other Ambulatory Visit: Payer: Self-pay

## 2020-05-27 VITALS — BP 112/79 | HR 97 | Resp 16 | Ht 67.0 in | Wt 145.6 lb

## 2020-05-27 DIAGNOSIS — R0609 Other forms of dyspnea: Secondary | ICD-10-CM

## 2020-05-27 DIAGNOSIS — I25118 Atherosclerotic heart disease of native coronary artery with other forms of angina pectoris: Secondary | ICD-10-CM | POA: Diagnosis not present

## 2020-05-27 DIAGNOSIS — I10 Essential (primary) hypertension: Secondary | ICD-10-CM

## 2020-05-27 MED ORDER — AMLODIPINE BESYLATE 5 MG PO TABS: 5.0000 mg | ORAL_TABLET | Freq: Every day | ORAL | 1 refills | Status: DC

## 2020-05-27 MED ORDER — TICAGRELOR 90 MG PO TABS: 90.0000 mg | ORAL_TABLET | Freq: Two times a day (BID) | ORAL | 2 refills | Status: DC

## 2020-05-27 MED ORDER — ROSUVASTATIN CALCIUM 20 MG PO TABS: 20.0000 mg | ORAL_TABLET | Freq: Every day | ORAL | 1 refills | Status: DC

## 2020-05-27 MED ORDER — METOPROLOL SUCCINATE ER 50 MG PO TB24: 50.0000 mg | ORAL_TABLET | Freq: Every day | ORAL | 1 refills | Status: DC

## 2020-05-27 MED ORDER — PANTOPRAZOLE SODIUM 40 MG PO TBEC: 40.0000 mg | DELAYED_RELEASE_TABLET | Freq: Every day | ORAL | 0 refills | Status: DC

## 2020-05-27 NOTE — Progress Notes (Signed)
Primary Physician/Referring:  Iona Beard, MD  Patient ID: Cody Preston, male    DOB: February 13, 1966, 54 y.o.   MRN: [id number]  Chief Complaint  Patient presents with  . Coronary Artery Disease  . Follow-up    1 month   HPI:    Cody Preston  is a 54 y.o. 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 Sisters present at bedside. He has CAD and h/o angioplasty with orbital atherectomy followed by stent to the proximal LAD on 12/18/2019. Present with ACS and NSTEMI on 04/19/2020, underwent urgent coronary angiogram and balloon angioplasty to occluded large circumflex, but unable to deliver the stent due to calcification.  He was scheduled for repeat angioplasty to large circumflex coronary artery however on 05/20/2020 angiography revealing a nondeployed stent in the left main and proximal circumflex, he underwent snaring and successful balloon angioplasty to the circumflex and left main distal to the stent to snare the stent out of the coronaries.  The stent was discharged at the ulnar artery level for which he underwent ulnar arteriotomy the same day and was discharged home the following day without any negative consequences.  He now presents to the office, states that he has noticed marked improvement in overall wellbeing, he had not had to use any sublingual nitroglycerin but occasionally still gets chest tightness.  He has resumed all his activities and is accompanied by sister.  Past Medical History:  Diagnosis Date  . Back pain   . Coronary artery disease   . Hypercholesteremia   . Hyperlipidemia    Past Surgical History:  Procedure Laterality Date  . [place] REMOVAL Right 05/20/2020    Procedure: REMOVAL OF STENT RIGHT ULNAR ARTERY AND PRIMARY REPAIR OF ULNAR ARTERY;  Surgeon: Angelia Mould, MD;  Location: [facility];  Service: Vascular;  Laterality: Right;  . BACK SURGERY    . BACK SURGERY    . CLUB FOOT RELEASE    . CORONARY ATHERECTOMY N/A 12/18/2019   Procedure: CORONARY ATHERECTOMY;  Surgeon: Nigel Mormon, MD;  Location: [facility];  Service: Cardiovascular;  Laterality: N/A;  . CORONARY BALLOON ANGIOPLASTY N/A 05/20/2020   Procedure: CORONARY BALLOON ANGIOPLASTY;  Surgeon: [name], MD;  Location: [facility];  Service: Cardiovascular;  Laterality: N/A;  . CORONARY STENT INTERVENTION  12/18/2019  . CORONARY STENT INTERVENTION N/A 12/18/2019   Procedure: CORONARY STENT INTERVENTION;  Surgeon: Nigel Mormon, MD;  Location: [facility];  Service: Cardiovascular;  Laterality: N/A;  . CORONARY THROMBECTOMY N/A 04/19/2020   Procedure: Coronary Thrombectomy;  Surgeon: [name], MD;  Location: [facility];  Service: Cardiovascular;  Laterality: N/A;  . CORONARY/GRAFT ACUTE MI REVASCULARIZATION N/A 04/19/2020   Procedure: Coronary/Graft Acute MI Revascularization;  Surgeon: [name], MD;  Location: [facility];  Service: Cardiovascular;  Laterality: N/A;  . INTRAVASCULAR ULTRASOUND/IVUS N/A 12/18/2019   Procedure: Intravascular Ultrasound/IVUS;  Surgeon: Nigel Mormon, MD;  Location: [facility];  Service: Cardiovascular;  Laterality: N/A;  . LEFT HEART CATH AND CORONARY ANGIOGRAPHY N/A 12/18/2019   Procedure: LEFT HEART CATH AND CORONARY ANGIOGRAPHY;  Surgeon: Nigel Mormon, MD;  Location: [facility];  Service: Cardiovascular;  Laterality: N/A;  . LEFT HEART CATH AND CORONARY ANGIOGRAPHY N/A 04/19/2020   Procedure: LEFT HEART CATH AND CORONARY ANGIOGRAPHY;  Surgeon: [name], MD;  Location: [facility];  Service: Cardiovascular;  Laterality: N/A;  . SHUNT REPLACEMENT     at 6 months of  age  . STENT REMOVAL Right 05/20/2020   REMOVAL CORONARY STENT FROM RIGHT ULNAR ARTER   Social History   Tobacco Use  . Smoking status: Never Smoker  . Smokeless tobacco: Never Used  Substance Use Topics  . Alcohol use: Not Currently   ROS  Review of Systems  Constitutional: Positive for malaise/fatigue.  Cardiovascular: Negative for chest pain, dyspnea on exertion and leg swelling.  Gastrointestinal: Negative for melena.   Objective  Blood pressure 112/79, pulse 97, resp. rate 16, height 5\' 7"  (1.702 m), weight 145 lb 9.6 oz (66 kg), SpO2 97 %.  Vitals with BMI 05/27/2020 05/21/2020 05/21/2020  Height 5\' 7"  - -  Weight 145 lbs 10 oz - -  BMI 48.5 - -  Systolic 462 703 500  Diastolic 79 56 74  Pulse 97 89 101     Physical Exam Neck:     Thyroid: No thyromegaly.  Cardiovascular:     Rate and Rhythm: Normal rate and regular rhythm.     Pulses: Intact distal pulses.     Heart sounds: Normal heart sounds. No murmur heard.  No gallop.      Comments: No leg edema, no JVD. Right arm arteriotomy site is healing well and no bruit. Normal ulnar and radial pulse at wrist. Pulmonary:     Effort: Pulmonary effort is normal.     Breath sounds: Normal breath sounds.  Abdominal:     General: Bowel sounds are normal.     Palpations: Abdomen is soft.  Musculoskeletal:     Cervical back: Neck supple.  Skin:    General: Skin is warm and dry.    Laboratory examination:   Recent Labs    04/20/20 0344 04/20/20 0344 04/20/20 1058 04/21/20 0830 05/21/20 0427  NA 133*  --  134*  --  133*  K 3.9   < > 3.6 4.2 3.6  CL 104  --  104  --  99  CO2 22  --  22  --  24  GLUCOSE 157*  --  149*  --  160*  BUN 15  --  15  --  13  CREATININE 0.97  --  0.94  --  1.02  CALCIUM 7.9*  --  8.3*  --  9.0  GFRNONAA >60  --  >60  --  >60  GFRAA >60  --  >60  --  >60   < > = values in this interval not displayed.   estimated creatinine clearance is 79.1 mL/min (by C-G formula based on SCr of 1.02  mg/dL).  CMP Latest Ref Rng & Units 05/21/2020 04/21/2020 04/20/2020  Glucose 70 - 99 mg/dL 160(H) - 149(H)  BUN 6 - 20 mg/dL 13 - 15  Creatinine 0.61 - 1.24 mg/dL 1.02 - 0.94  Sodium 135 - 145 mmol/L 133(L) - 134(L)  Potassium 3.5 - 5.1 mmol/L 3.6 4.2 3.6  Chloride 98 - 111 mmol/L 99 - 104  CO2 22 - 32 mmol/L 24 - 22  Calcium 8.9 - 10.3 mg/dL 9.0 - 8.3(L)   CBC Latest Ref Rng & Units 05/21/2020 04/22/2020 04/21/2020  WBC 4.0 - 10.5 K/uL 6.4 9.1 12.0(H)  Hemoglobin 13.0 -  17.0 g/dL 10.2(L) 11.0(L) 11.8(L)  Hematocrit 39 - 52 % 31.3(L) 33.1(L) 36.2(L)  Platelets 150 - 400 K/uL 279 259 281   Lipid Panel     Component Value Date/Time   CHOL 128 04/21/2020 0320   TRIG 69 04/21/2020 0320   HDL 62 04/21/2020 0320   CHOLHDL 2.1 04/21/2020 0320   VLDL 14 04/21/2020 0320   LDLCALC 52 04/21/2020 0320   HEMOGLOBIN A1C Lab Results  Component Value Date   HGBA1C 5.9 (H) 04/21/2020   MPG 122.63 04/21/2020   TSH No results for input(s): TSH in the last 8760 hours.  Outside labs: 04/17/2019 Cholesterol, total 162.000 m  HDL 71.000 mg 04/17/2019 LDL 77.000 mg 04/17/2019 Triglycerides 63.000 mg 04/17/2019  Medications and allergies  No Known Allergies   Current Outpatient Medications on File Prior to Visit  Medication Sig Dispense Refill  . acetaminophen (TYLENOL) 325 MG tablet Take 2 tablets (650 mg total) by mouth every 12 (twelve) hours as needed for moderate pain. 60 tablet 2  . aspirin 81 MG chewable tablet Chew 1 tablet (81 mg total) by mouth daily. 30 tablet 2  . diclofenac Sodium (VOLTAREN) 1 % GEL Apply 1 g topically 4 (four) times daily as needed.    . nitroGLYCERIN (NITROSTAT) 0.4 MG SL tablet Place 1 tablet (0.4 mg total) under the tongue every 5 (five) minutes as needed for chest pain. 30 tablet 2  . traMADol (ULTRAM) 50 MG tablet Take 50 mg by mouth every 8 (eight) hours.     No current facility-administered medications on file prior to visit.    Radiology:  No results  found.  Cardiac Studies:   Lexiscan Tetrofosmin Stress Test  11/19/2019: Abnormal ECG stress. Intravenous Lexiscan protocol. Resting EKG/ECG demonstrated normal sinus rhythm. Peak EKG/ECG revealed 1 mm horizontal ST depression of the inferolateral leads. During infusion the patient developed no chest pain, and exhibited arm pain, dizziness, jaw pain, & headache. Large extent perfusion defect consistent with severe ischemia in the mid to distal anterior and anterolateral wall in LAD distribution.  Gated SPECT imaging of the left ventricle demonstrating akinesis in the same region.  Stress LVEF moderately depressed at 37%.  No previous exam available for comparison. High risk study.   Coronary angiogram and intervention 04/20/2020: LV: Moderate to severely elevated LVEDP of 25 mmHg. No pressure gradient across the aortic valve. LVEF 50% without wall motion abnormality in the RAO projection. No significant MR. RCA: Large caliber vessel, widely patent. Mild disease. Left main: Mildly calcified but no significant disease. LAD: Large caliber vessel. Previously placed stent in the proximal LAD in February 2021 is widely patent (stenting 3.0 x 46 mm and a 3.0 x 18 mm resolute Onyx DES). D1 is occluded and that is chronic. Faint ipsilateral collaterals are evident. A moderate-sized D2 is widely patent. Circumflex: Occluded in the proximal segment. Mild to moderate amount of calcification evident. SP thrombectomy with Pronto V4 catheter followed by balloon angioplasty with a 2.5 x 12 mm sapphire balloon. In spite of utilization of multiple guidewires including cougar XT, mailman, and also utilization of guide liner for support. Suspect significant amount of calcification, leading to difficulty in delivering the stents. A 3.5 x 22 mm resolute Onyx and a 3.5 x 18 mm Orsero stents were attempted to cross.  TIMI-3 flow was established with minimal residual stenosis of 5 to 10%.  Recommendation:  Patient will be started on aspirin, indefinitely and Brilinta for at least a year. Could consider repeat coronary  angiography in 4 to 6 weeks for evaluation for restenosis, and consider arthrectomy followed by stenting.  If remains stable, can be discharged in 24 to 48 hours. 155 mL contrast utilized. Extremely difficult procedure.  Echocardiogram 04/20/2020: 1. Left ventricular ejection fraction, by estimation, is 40 to 45%. The left ventricle has mildly decreased function. The left ventricle  demonstrates regional wall motion abnormalities (see scoring  diagram/findings for description). Left ventricular  diastolic parameters are indeterminate. There is moderate hypokinesis of  the left ventricular, basal-mid inferior wall.  2. Right ventricular systolic function is normal. The right ventricular  size is normal. There is mildly elevated pulmonary artery systolic  pressure.  3. The mitral valve is grossly normal. Moderate to severe mitral valve  regurgitation.  4. Tricuspid valve regurgitation is moderate. RA-RV peak gradient 34  mmHg.  5. Compared to previous study on 11/20/2019, LVEF is lowered from 55-65%  to 40-45%, wall motion abnormalities are new, valvular regurgitation more  prominent.   Left Heart Catheterization 05/20/2020:  PTCA and angioplasty of the circumflex coronary artery and left main coronary artery, snaring of the previous stent that was undeployed in the left main and circumflex coronary artery.  Ulnar arteriotomy 05/20/2020: A transverse arteriotomy was made just above the stent after the proximal artery was clamped. I was able to reach down into the artery and retrieve the stent with a small hemostat.   EKG:   EKG 04/29/2020: Normal sinus rhythm with rate of 72 bpm, left axis deviation, left intrafascicular block.  Incomplete right bundle branch block.  Cannot exclude posterior infarct old.  Nonspecific T abnormality high lateral leads. Low voltage complexes.    EKG 12/19/2019: Sinus tachycardia at rate of 101 bpm, normal axis, no evidence of ischemia.  Low-voltage complexes.  No significant change from 11/15/2018.  Assessment     ICD-10-CM   1. Coronary artery disease of native artery of native heart with stable angina pectoris ([place])  I25.118   2. Primary hypertension  I10   3. Dyspnea on exertion  R06.00     Meds ordered this encounter  Medications  . amLODipine (NORVASC) 5 MG tablet    Sig: Take 1 tablet (5 mg total) by mouth daily.    Dispense:  90 tablet    Refill:  1  . metoprolol succinate (TOPROL-XL) 50 MG 24 hr tablet    Sig: Take 1 tablet (50 mg total) by mouth daily.    Dispense:  90 tablet    Refill:  1  . rosuvastatin (CRESTOR) 20 MG tablet    Sig: Take 1 tablet (20 mg total) by mouth daily.    Dispense:  90 tablet    Refill:  1  . ticagrelor (BRILINTA) 90 MG TABS tablet    Sig: Take 1 tablet (90 mg total) by mouth 2 (two) times daily.    Dispense:  180 tablet    Refill:  2  . pantoprazole (PROTONIX) 40 MG tablet    Sig: Take 1 tablet (40 mg total) by mouth daily.    Dispense:  30 tablet    Refill:  0    Medications Discontinued During This Encounter  Medication Reason  . rosuvastatin (CRESTOR) 20 MG tablet Reorder  . pantoprazole (PROTONIX) 40 MG tablet Reorder  . ticagrelor (BRILINTA) 90 MG TABS tablet Reorder  . metoprolol succinate (TOPROL-XL) 50 MG 24 hr tablet Reorder  . amLODipine (NORVASC) 5 MG tablet Reorder  . furosemide (LASIX) 20 MG tablet Discontinued by provider  Recommendations:   Cody Preston  is a 54 y.o.  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 Sisters present at bedside. He has CAD  and h/o angioplasty with orbital atherectomy followed by stent to the proximal LAD on 12/18/2019. Present with ACS and NSTEMI on 04/19/2020, underwent urgent coronary angiogram and balloon angioplasty to occluded large circumflex, but unable to deliver the stent due to calcification. On 05/20/2020 repeat coronary angiography revealing a nondeployed stent in the left main and proximal circumflex, he underwent snaring and successful balloon angioplasty to the circumflex and left main distal to the stent to snare the stent out of the coronaries and ulnar arteriotomy to retreive the stent.   I discussed the findings of the coronary angiography with the patient and his sister again and explained significant amount of calcification that is evident during the original emergent catheterization performed on the stent was lost.  They are appreciative the care.  I have refilled his prescriptions, lipids under good control, blood pressure is also well controlled.  I will be reviewing the patient presentation with my colleagues and see whether we could continue to observe for now without repeat angiography as circumflex and left main appeared to be fairly intact versus relook depending upon consensus.  I would like to see the patient back in 6 weeks for follow-up.   [name], MD, [facility] 05/28/2020, 8:09 AM Office: [phone]

## 2020-06-10 ENCOUNTER — Telehealth (HOSPITAL_COMMUNITY): Payer: Self-pay

## 2020-06-10 NOTE — Telephone Encounter (Signed)
recv'ed 2nd Medicaid form from Dr. Einar Gip office. Form missing the 2nd section, will re-fax form and place back in fax awaiting folder

## 2020-06-18 ENCOUNTER — Ambulatory Visit: Payer: Medicare Other | Admitting: Cardiology

## 2020-06-20 ENCOUNTER — Other Ambulatory Visit: Payer: Self-pay

## 2020-06-20 ENCOUNTER — Ambulatory Visit: Payer: Medicare Other | Admitting: Cardiology

## 2020-06-20 ENCOUNTER — Encounter: Payer: Self-pay | Admitting: Cardiology

## 2020-06-20 VITALS — BP 116/75 | HR 76 | Resp 15 | Ht 67.0 in | Wt 144.0 lb

## 2020-06-20 DIAGNOSIS — I25118 Atherosclerotic heart disease of native coronary artery with other forms of angina pectoris: Secondary | ICD-10-CM | POA: Diagnosis not present

## 2020-06-20 DIAGNOSIS — I10 Essential (primary) hypertension: Secondary | ICD-10-CM | POA: Diagnosis not present

## 2020-06-20 DIAGNOSIS — E7800 Pure hypercholesterolemia, unspecified: Secondary | ICD-10-CM

## 2020-06-20 NOTE — H&P (View-Only) (Signed)
Primary Physician/Referring:  Iona Beard, MD  Patient ID: Cody Preston, male    DOB: May 28, 1966, 54 y.o.   MRN: [id number]  Chief Complaint  Patient presents with  . Follow-up    4 week  . PCI   HPI:    Cody Preston  is a 54 y.o. 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 Sisters present at bedside. He has CAD and h/o angioplasty with orbital atherectomy followed by stent to the proximal LAD on 12/18/2019. Present with ACS and NSTEMI on 04/19/2020, underwent urgent coronary angiogram and balloon angioplasty to occluded large circumflex, but unable to deliver the stent due to calcification.  He was scheduled for repeat angioplasty to large circumflex coronary artery however on 05/20/2020 angiography revealing a nondeployed stent in the left main and proximal circumflex, he underwent snaring and successful balloon angioplasty to the circumflex and left main distal to the stent to snare the stent out of the coronaries.  The stent was discharged at the ulnar artery level for which he underwent ulnar arteriotomy the same day and was discharged home the following day without any negative consequences.    He is accompanied by sister.  States that he has been having recurrence of angina and had to use sublingual nitroglycerin, he has used 3 sublingual nitroglycerin in the past 1 week.  He has not had any rest pain, activities brought on chest discomfort.  Past Medical History:  Diagnosis Date  . Back pain   . Coronary artery disease   . Hypercholesteremia   . Hyperlipidemia    Past Surgical History:  Procedure Laterality Date  . [place] REMOVAL Right 05/20/2020   Procedure: REMOVAL OF  STENT RIGHT ULNAR ARTERY AND PRIMARY REPAIR OF ULNAR ARTERY;  Surgeon: Angelia Mould, MD;  Location: [facility];  Service: Vascular;  Laterality: Right;  . BACK SURGERY    . BACK SURGERY    . CLUB FOOT RELEASE    . CORONARY ATHERECTOMY N/A 12/18/2019   Procedure: CORONARY ATHERECTOMY;  Surgeon: Nigel Mormon, MD;  Location: [facility];  Service: Cardiovascular;  Laterality: N/A;  . CORONARY BALLOON ANGIOPLASTY N/A 05/20/2020   Procedure: CORONARY BALLOON ANGIOPLASTY;  Surgeon: [name], MD;  Location: [facility];  Service: Cardiovascular;  Laterality: N/A;  . CORONARY STENT INTERVENTION  12/18/2019  . CORONARY STENT INTERVENTION N/A 12/18/2019   Procedure: CORONARY STENT INTERVENTION;  Surgeon: Nigel Mormon, MD;  Location: [facility];  Service: Cardiovascular;  Laterality: N/A;  . CORONARY THROMBECTOMY N/A 04/19/2020   Procedure: Coronary Thrombectomy;  Surgeon: [name], MD;  Location: [facility];  Service: Cardiovascular;  Laterality: N/A;  . CORONARY/GRAFT ACUTE MI REVASCULARIZATION N/A 04/19/2020   Procedure: Coronary/Graft Acute MI Revascularization;  Surgeon: [name], MD;  Location: [facility];  Service: Cardiovascular;  Laterality: N/A;  . INTRAVASCULAR ULTRASOUND/IVUS N/A 12/18/2019   Procedure: Intravascular Ultrasound/IVUS;  Surgeon: Nigel Mormon, MD;  Location: [facility];  Service: Cardiovascular;  Laterality: N/A;  . LEFT HEART CATH AND CORONARY ANGIOGRAPHY N/A 12/18/2019   Procedure: LEFT HEART CATH AND CORONARY ANGIOGRAPHY;  Surgeon: Nigel Mormon, MD;  Location: [facility];  Service: Cardiovascular;  Laterality: N/A;  . LEFT HEART CATH AND CORONARY ANGIOGRAPHY N/A 04/19/2020   Procedure: LEFT HEART CATH AND CORONARY ANGIOGRAPHY;  Surgeon: [name], MD;  Location: [facility];  Service: Cardiovascular;  Laterality: N/A;  . SHUNT REPLACEMENT     at 31 months of age  . STENT REMOVAL  Right 05/20/2020   REMOVAL CORONARY STENT FROM RIGHT ULNAR ARTER   Social History   Tobacco Use  . Smoking status: Never Smoker  . Smokeless tobacco: Never Used  Substance Use Topics  . Alcohol use: Not Currently   ROS  Review of Systems  Constitutional: Negative for malaise/fatigue.  Cardiovascular: Positive for chest pain. Negative for dyspnea on exertion and leg swelling.  Gastrointestinal: Negative for melena.   Objective  Blood pressure 116/75, pulse 76, resp. rate 15, height 5\' 7"  (1.702 m), weight 144 lb (65.3 kg), SpO2 98 %.  Vitals with BMI 06/20/2020 05/27/2020 05/21/2020  Height 5\' 7"  5\' 7"  -  Weight 144 lbs 145 lbs 10 oz -  BMI 97.98 92.1 -  Systolic 194 174 081  Diastolic 75 79 56  Pulse 76 97 89     Physical Exam Neck:     Thyroid: No thyromegaly.  Cardiovascular:     Rate and Rhythm: Normal rate and regular rhythm.     Pulses: Intact distal pulses.     Heart sounds: Normal heart sounds. No murmur heard.  No gallop.      Comments: No leg edema, no JVD. Right arm arteriotomy site is healing well and no bruit. Normal ulnar and radial pulse at wrist. Pulmonary:     Effort: Pulmonary effort is normal.     Breath sounds: Normal breath sounds.  Abdominal:     General: Bowel sounds are normal.     Palpations: Abdomen is soft.  Musculoskeletal:     Cervical back: Neck supple.  Skin:    General: Skin is warm and dry.    Laboratory examination:   Recent Labs    04/20/20 0344 04/20/20 0344 04/20/20 1058 04/21/20 0830 05/21/20 0427  NA 133*  --  134*  --  133*  K 3.9   < > 3.6 4.2 3.6  CL 104  --  104  --  99  CO2 22  --  22  --  24  GLUCOSE 157*  --  149*  --  160*  BUN 15  --  15  --  13  CREATININE 0.97  --  0.94  --  1.02  CALCIUM 7.9*  --  8.3*  --  9.0  GFRNONAA >60  --  >60  --  >60  GFRAA >60  --  >60  --  >60   < > = values in this interval not displayed.   CrCl cannot be calculated (Patient's most recent lab result is older than the  maximum 21 days allowed.).  CMP Latest Ref Rng & Units 05/21/2020 04/21/2020 04/20/2020  Glucose 70 - 99 mg/dL 160(H) - 149(H)  BUN 6 - 20 mg/dL 13 - 15  Creatinine 0.61 - 1.24 mg/dL 1.02 - 0.94  Sodium 135 - 145 mmol/L 133(L) - 134(L)  Potassium 3.5 - 5.1 mmol/L 3.6 4.2 3.6  Chloride 98 - 111 mmol/L 99 - 104  CO2 22 - 32 mmol/L 24 - 22  Calcium 8.9 - 10.3 mg/dL 9.0 - 8.3(L)   CBC Latest Ref Rng & Units 05/21/2020 04/22/2020 04/21/2020  WBC 4.0 - 10.5  K/uL 6.4 9.1 12.0(H)  Hemoglobin 13.0 - 17.0 g/dL 10.2(L) 11.0(L) 11.8(L)  Hematocrit 39 - 52 % 31.3(L) 33.1(L) 36.2(L)  Platelets 150 - 400 K/uL 279 259 281   Lipid Panel     Component Value Date/Time   CHOL 128 04/21/2020 0320   TRIG 69 04/21/2020 0320   HDL 62 04/21/2020 0320   CHOLHDL 2.1 04/21/2020 0320   VLDL 14 04/21/2020 0320   LDLCALC 52 04/21/2020 0320   HEMOGLOBIN A1C Lab Results  Component Value Date   HGBA1C 5.9 (H) 04/21/2020   MPG 122.63 04/21/2020   TSH No results for input(s): TSH in the last 8760 hours.  Outside labs: 04/17/2019 Cholesterol, total 162.000 m  HDL 71.000 mg 04/17/2019 LDL 77.000 mg 04/17/2019 Triglycerides 63.000 mg 04/17/2019  Medications and allergies  No Known Allergies   Current Outpatient Medications on File Prior to Visit  Medication Sig Dispense Refill  . acetaminophen (TYLENOL) 325 MG tablet Take 2 tablets (650 mg total) by mouth every 12 (twelve) hours as needed for moderate pain. 60 tablet 2  . amLODipine (NORVASC) 5 MG tablet Take 1 tablet (5 mg total) by mouth daily. 90 tablet 1  . aspirin 81 MG chewable tablet Chew 1 tablet (81 mg total) by mouth daily. 30 tablet 2  . diclofenac Sodium (VOLTAREN) 1 % GEL Apply 1 g topically 4 (four) times daily as needed.     . metoprolol succinate (TOPROL-XL) 50 MG 24 hr tablet Take 1 tablet (50 mg total) by mouth daily. 90 tablet 1  . nitroGLYCERIN (NITROSTAT) 0.4 MG SL tablet Place 1 tablet (0.4 mg total) under the tongue every 5 (five) minutes  as needed for chest pain. 30 tablet 2  . pantoprazole (PROTONIX) 40 MG tablet Take 1 tablet (40 mg total) by mouth daily. 30 tablet 0  . rosuvastatin (CRESTOR) 20 MG tablet Take 1 tablet (20 mg total) by mouth daily. 90 tablet 1  . ticagrelor (BRILINTA) 90 MG TABS tablet Take 1 tablet (90 mg total) by mouth 2 (two) times daily. 180 tablet 2  . traMADol (ULTRAM) 50 MG tablet Take 50 mg by mouth every 8 (eight) hours.     No current facility-administered medications on file prior to visit.    Radiology:  No results found.  Cardiac Studies:   Lexiscan Tetrofosmin Stress Test  11/19/2019: Abnormal ECG stress. Intravenous Lexiscan protocol. Resting EKG/ECG demonstrated normal sinus rhythm. Peak EKG/ECG revealed 1 mm horizontal ST depression of the inferolateral leads. During infusion the patient developed no chest pain, and exhibited arm pain, dizziness, jaw pain, & headache. Large extent perfusion defect consistent with severe ischemia in the mid to distal anterior and anterolateral wall in LAD distribution.  Gated SPECT imaging of the left ventricle demonstrating akinesis in the same region.  Stress LVEF moderately depressed at 37%.  No previous exam available for comparison. High risk study.   Coronary angiogram and intervention 04/20/2020: LV: Moderate to severely elevated LVEDP of 25 mmHg. No pressure gradient across the aortic valve. LVEF 50% without wall motion abnormality in the RAO projection. No significant MR. RCA: Large caliber vessel, widely patent. Mild disease. Left main: Mildly calcified but no significant disease. LAD: Large caliber vessel. Previously placed stent in the proximal LAD in February 2021 is widely patent (stenting 3.0 x 46 mm and a 3.0 x 18 mm resolute Onyx DES). D1 is occluded and that is chronic. Faint ipsilateral collaterals are evident. A moderate-sized D2 is widely patent. Circumflex: Occluded in the  proximal segment. Mild to moderate amount of  calcification evident. SP thrombectomy with Pronto V4 catheter followed by balloon angioplasty with a 2.5 x 12 mm sapphire balloon. In spite of utilization of multiple guidewires including cougar XT, mailman, and also utilization of guide liner for support. Suspect significant amount of calcification, leading to difficulty in delivering the stents. A 3.5 x 22 mm resolute Onyx and a 3.5 x 18 mm Orsero stents were attempted to cross.  TIMI-3 flow was established with minimal residual stenosis of 5 to 10%.  Recommendation: Patient will be started on aspirin, indefinitely and Brilinta for at least a year. Could consider repeat coronary angiography in 4 to 6 weeks for evaluation for restenosis, and consider arthrectomy followed by stenting.  If remains stable, can be discharged in 24 to 48 hours. 155 mL contrast utilized. Extremely difficult procedure.  Echocardiogram 04/20/2020: 1. Left ventricular ejection fraction, by estimation, is 40 to 45%. The left ventricle has mildly decreased function. The left ventricle  demonstrates regional wall motion abnormalities (see scoring  diagram/findings for description). Left ventricular  diastolic parameters are indeterminate. There is moderate hypokinesis of  the left ventricular, basal-mid inferior wall.  2. Right ventricular systolic function is normal. The right ventricular  size is normal. There is mildly elevated pulmonary artery systolic  pressure.  3. The mitral valve is grossly normal. Moderate to severe mitral valve  regurgitation.  4. Tricuspid valve regurgitation is moderate. RA-RV peak gradient 34  mmHg.  5. Compared to previous study on 11/20/2019, LVEF is lowered from 55-65%  to 40-45%, wall motion abnormalities are new, valvular regurgitation more  prominent.   Left Heart Catheterization 05/20/2020:  PTCA and angioplasty of the circumflex coronary artery and left main coronary artery, snaring of the previous stent that was undeployed  in the left main and circumflex coronary artery.  Ulnar arteriotomy 05/20/2020: A transverse arteriotomy was made just above the stent after the proximal artery was clamped. I was able to reach down into the artery and retrieve the stent with a small hemostat.   EKG:   EKG 04/29/2020: Normal sinus rhythm with rate of 72 bpm, left axis deviation, left intrafascicular block.  Incomplete right bundle branch block.  Cannot exclude posterior infarct old.  Nonspecific T abnormality high lateral leads. Low voltage complexes.   EKG 12/19/2019: Sinus tachycardia at rate of 101 bpm, normal axis, no evidence of ischemia.  Low-voltage complexes.  No significant change from 11/15/2018.  Assessment     ICD-10-CM   1. Coronary artery disease of native artery of native heart with stable angina pectoris (HCC)  C94.709 Basic metabolic panel    CBC  2. Primary hypertension  I10   3. Hypercholesteremia  E78.00     No orders of the defined types were placed in this encounter.   There are no discontinued medications.  Recommendations:   Cody Preston  is a 54 y.o.  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 Sisters present at bedside. He has CAD and h/o angioplasty with orbital atherectomy followed by stent to the proximal LAD on 12/18/2019. Present with ACS and NSTEMI on 04/19/2020, underwent urgent coronary angiogram and balloon angioplasty to occluded large circumflex, but unable  to deliver the stent due to calcification. On 05/20/2020 repeat coronary angiography revealing a nondeployed stent in the left main and proximal circumflex, he underwent snaring and successful balloon angioplasty to the  circumflex and left main distal to the stent to snare the stent out of the coronaries and ulnar arteriotomy to retreive the stent.   I discussed the findings of the coronary angiography with the patient and his sister again and explained significant amount of calcification that is evident during the original emergent catheterization performed on the stent was lost.  They are appreciative the care.  His surgical site at the right antecubital fossa has completely healed.  He has good radial pulse.  Blood pressure is well controlled, lipids are now being aggressively managed.  As described previously 4 weeks ago, I reviewed the angiograms with my interventional colleagues and presented at the interventional conference.  Consensus was for relook at both left main and circumflex coronary artery to further evaluate the circumflex coronary artery significance stenosis due to heavy calcification.  Also patient is now having recurrence of angina pectoris, he has been taking frequent nitroglycerin, he has taken 3 sublingual nitroglycerin with immediate relief of chest pain last 1 was 2 days ago.  Hence I recommended that he proceed with coronary angiography with angioplasty to the circumflex coronary artery.  Patient sister is also present and they are willing to proceed.  He is aware of the risks associated with this.   [name], MD, [facility] 06/22/2020, 3:10 PM Office: [phone]

## 2020-06-20 NOTE — Progress Notes (Signed)
Primary Physician/Referring:  Iona Beard, MD  Patient ID: Cody Preston, male    DOB: April 05, 1966, 54 y.o.   MRN: [id number]  Chief Complaint  Patient presents with  . Follow-up    4 week  . PCI   HPI:    Cody Preston  is a 54 y.o. 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 Sisters present at bedside. He has CAD and h/o angioplasty with orbital atherectomy followed by stent to the proximal LAD on 12/18/2019. Present with ACS and NSTEMI on 04/19/2020, underwent urgent coronary angiogram and balloon angioplasty to occluded large circumflex, but unable to deliver the stent due to calcification.  He was scheduled for repeat angioplasty to large circumflex coronary artery however on 05/20/2020 angiography revealing a nondeployed stent in the left main and proximal circumflex, he underwent snaring and successful balloon angioplasty to the circumflex and left main distal to the stent to snare the stent out of the coronaries.  The stent was discharged at the ulnar artery level for which he underwent ulnar arteriotomy the same day and was discharged home the following day without any negative consequences.    He is accompanied by sister.  States that he has been having recurrence of angina and had to use sublingual nitroglycerin, he has used 3 sublingual nitroglycerin in the past 1 week.  He has not had any rest pain, activities brought on chest discomfort.  Past Medical History:  Diagnosis Date  . Back pain   . Coronary artery disease   . Hypercholesteremia   . Hyperlipidemia    Past Surgical History:  Procedure Laterality Date  . [place] REMOVAL Right 05/20/2020   Procedure: REMOVAL OF  STENT RIGHT ULNAR ARTERY AND PRIMARY REPAIR OF ULNAR ARTERY;  Surgeon: Angelia Mould, MD;  Location: [place];  Service: Vascular;  Laterality: Right;  . BACK SURGERY    . BACK SURGERY    . CLUB FOOT RELEASE    . CORONARY ATHERECTOMY N/A 12/18/2019   Procedure: CORONARY ATHERECTOMY;  Surgeon: Nigel Mormon, MD;  Location: [place] [facility];  Service: Cardiovascular;  Laterality: N/A;  . CORONARY BALLOON ANGIOPLASTY N/A 05/20/2020   Procedure: CORONARY BALLOON ANGIOPLASTY;  Surgeon: [name], MD;  Location: [place] [facility];  Service: Cardiovascular;  Laterality: N/A;  . CORONARY STENT INTERVENTION  12/18/2019  . CORONARY STENT INTERVENTION N/A 12/18/2019   Procedure: CORONARY STENT INTERVENTION;  Surgeon: Nigel Mormon, MD;  Location: [place] [facility];  Service: Cardiovascular;  Laterality: N/A;  . CORONARY THROMBECTOMY N/A 04/19/2020   Procedure: Coronary Thrombectomy;  Surgeon: [name], MD;  Location: [place] [facility];  Service: Cardiovascular;  Laterality: N/A;  . CORONARY/GRAFT ACUTE MI REVASCULARIZATION N/A 04/19/2020   Procedure: Coronary/Graft Acute MI Revascularization;  Surgeon: [name], MD;  Location: [place] [facility];  Service: Cardiovascular;  Laterality: N/A;  . INTRAVASCULAR ULTRASOUND/IVUS N/A 12/18/2019   Procedure: Intravascular Ultrasound/IVUS;  Surgeon: Nigel Mormon, MD;  Location: [place] [facility];  Service: Cardiovascular;  Laterality: N/A;  . LEFT HEART CATH AND CORONARY ANGIOGRAPHY N/A 12/18/2019   Procedure: LEFT HEART CATH AND CORONARY ANGIOGRAPHY;  Surgeon: Nigel Mormon, MD;  Location: [place]  [facility];  Service: Cardiovascular;  Laterality: N/A;  . LEFT HEART CATH AND CORONARY ANGIOGRAPHY N/A 04/19/2020   Procedure: LEFT HEART CATH AND CORONARY ANGIOGRAPHY;  Surgeon: [name], MD;  Location: [facility];  Service: Cardiovascular;  Laterality: N/A;  . SHUNT REPLACEMENT     at 58 months of age  . STENT REMOVAL  Right 05/20/2020   REMOVAL CORONARY STENT FROM RIGHT ULNAR ARTER   Social History   Tobacco Use  . Smoking status: Never Smoker  . Smokeless tobacco: Never Used  Substance Use Topics  . Alcohol use: Not Currently   ROS  Review of Systems  Constitutional: Negative for malaise/fatigue.  Cardiovascular: Positive for chest pain. Negative for dyspnea on exertion and leg swelling.  Gastrointestinal: Negative for melena.   Objective  Blood pressure 116/75, pulse 76, resp. rate 15, height 5\' 7"  (1.702 m), weight 144 lb (65.3 kg), SpO2 98 %.  Vitals with BMI 06/20/2020 05/27/2020 05/21/2020  Height 5\' 7"  5\' 7"  -  Weight 144 lbs 145 lbs 10 oz -  BMI 01.60 10.9 -  Systolic 323 557 322  Diastolic 75 79 56  Pulse 76 97 89     Physical Exam Neck:     Thyroid: No thyromegaly.  Cardiovascular:     Rate and Rhythm: Normal rate and regular rhythm.     Pulses: Intact distal pulses.     Heart sounds: Normal heart sounds. No murmur heard.  No gallop.      Comments: No leg edema, no JVD. Right arm arteriotomy site is healing well and no bruit. Normal ulnar and radial pulse at wrist. Pulmonary:     Effort: Pulmonary effort is normal.     Breath sounds: Normal breath sounds.  Abdominal:     General: Bowel sounds are normal.     Palpations: Abdomen is soft.  Musculoskeletal:     Cervical back: Neck supple.  Skin:    General: Skin is warm and dry.    Laboratory examination:   Recent Labs    04/20/20 0344 04/20/20 0344 04/20/20 1058 04/21/20 0830 05/21/20 0427  NA 133*  --  134*  --  133*  K 3.9   < > 3.6 4.2 3.6  CL 104  --  104  --  99  CO2 22  --  22  --  24  GLUCOSE 157*  --  149*  --  160*  BUN 15  --  15  --  13  CREATININE 0.97  --  0.94  --  1.02  CALCIUM 7.9*  --  8.3*  --  9.0  GFRNONAA >60  --  >60  --  >60  GFRAA >60  --  >60  --  >60   < > = values in this interval not displayed.   CrCl cannot be calculated (Patient's most recent lab result is older than the  maximum 21 days allowed.).  CMP Latest Ref Rng & Units 05/21/2020 04/21/2020 04/20/2020  Glucose 70 - 99 mg/dL 160(H) - 149(H)  BUN 6 - 20 mg/dL 13 - 15  Creatinine 0.61 - 1.24 mg/dL 1.02 - 0.94  Sodium 135 - 145 mmol/L 133(L) - 134(L)  Potassium 3.5 - 5.1 mmol/L 3.6 4.2 3.6  Chloride 98 - 111 mmol/L 99 - 104  CO2 22 - 32 mmol/L 24 - 22  Calcium 8.9 - 10.3 mg/dL 9.0 - 8.3(L)   CBC Latest Ref Rng & Units 05/21/2020 04/22/2020 04/21/2020  WBC 4.0 - 10.5  K/uL 6.4 9.1 12.0(H)  Hemoglobin 13.0 - 17.0 g/dL 10.2(L) 11.0(L) 11.8(L)  Hematocrit 39 - 52 % 31.3(L) 33.1(L) 36.2(L)  Platelets 150 - 400 K/uL 279 259 281   Lipid Panel     Component Value Date/Time   CHOL 128 04/21/2020 0320   TRIG 69 04/21/2020 0320   HDL 62 04/21/2020 0320   CHOLHDL 2.1 04/21/2020 0320   VLDL 14 04/21/2020 0320   LDLCALC 52 04/21/2020 0320   HEMOGLOBIN A1C Lab Results  Component Value Date   HGBA1C 5.9 (H) 04/21/2020   MPG 122.63 04/21/2020   TSH No results for input(s): TSH in the last 8760 hours.  Outside labs: 04/17/2019 Cholesterol, total 162.000 m  HDL 71.000 mg 04/17/2019 LDL 77.000 mg 04/17/2019 Triglycerides 63.000 mg 04/17/2019  Medications and allergies  No Known Allergies   Current Outpatient Medications on File Prior to Visit  Medication Sig Dispense Refill  . acetaminophen (TYLENOL) 325 MG tablet Take 2 tablets (650 mg total) by mouth every 12 (twelve) hours as needed for moderate pain. 60 tablet 2  . amLODipine (NORVASC) 5 MG tablet Take 1 tablet (5 mg total) by mouth daily. 90 tablet 1  . aspirin 81 MG chewable tablet Chew 1 tablet (81 mg total) by mouth daily. 30 tablet 2  . diclofenac Sodium (VOLTAREN) 1 % GEL Apply 1 g topically 4 (four) times daily as needed.     . metoprolol succinate (TOPROL-XL) 50 MG 24 hr tablet Take 1 tablet (50 mg total) by mouth daily. 90 tablet 1  . nitroGLYCERIN (NITROSTAT) 0.4 MG SL tablet Place 1 tablet (0.4 mg total) under the tongue every 5 (five) minutes  as needed for chest pain. 30 tablet 2  . pantoprazole (PROTONIX) 40 MG tablet Take 1 tablet (40 mg total) by mouth daily. 30 tablet 0  . rosuvastatin (CRESTOR) 20 MG tablet Take 1 tablet (20 mg total) by mouth daily. 90 tablet 1  . ticagrelor (BRILINTA) 90 MG TABS tablet Take 1 tablet (90 mg total) by mouth 2 (two) times daily. 180 tablet 2  . traMADol (ULTRAM) 50 MG tablet Take 50 mg by mouth every 8 (eight) hours.     No current facility-administered medications on file prior to visit.    Radiology:  No results found.  Cardiac Studies:   Lexiscan Tetrofosmin Stress Test  11/19/2019: Abnormal ECG stress. Intravenous Lexiscan protocol. Resting EKG/ECG demonstrated normal sinus rhythm. Peak EKG/ECG revealed 1 mm horizontal ST depression of the inferolateral leads. During infusion the patient developed no chest pain, and exhibited arm pain, dizziness, jaw pain, & headache. Large extent perfusion defect consistent with severe ischemia in the mid to distal anterior and anterolateral wall in LAD distribution.  Gated SPECT imaging of the left ventricle demonstrating akinesis in the same region.  Stress LVEF moderately depressed at 37%.  No previous exam available for comparison. High risk study.   Coronary angiogram and intervention 04/20/2020: LV: Moderate to severely elevated LVEDP of 25 mmHg. No pressure gradient across the aortic valve. LVEF 50% without wall motion abnormality in the RAO projection. No significant MR. RCA: Large caliber vessel, widely patent. Mild disease. Left main: Mildly calcified but no significant disease. LAD: Large caliber vessel. Previously placed stent in the proximal LAD in February 2021 is widely patent (stenting 3.0 x 46 mm and a 3.0 x 18 mm resolute Onyx DES). D1 is occluded and that is chronic. Faint ipsilateral collaterals are evident. A moderate-sized D2 is widely patent. Circumflex: Occluded in the  proximal segment. Mild to moderate amount of  calcification evident. SP thrombectomy with Pronto V4 catheter followed by balloon angioplasty with a 2.5 x 12 mm sapphire balloon. In spite of utilization of multiple guidewires including cougar XT, mailman, and also utilization of guide liner for support. Suspect significant amount of calcification, leading to difficulty in delivering the stents. A 3.5 x 22 mm resolute Onyx and a 3.5 x 18 mm Orsero stents were attempted to cross.  TIMI-3 flow was established with minimal residual stenosis of 5 to 10%.  Recommendation: Patient will be started on aspirin, indefinitely and Brilinta for at least a year. Could consider repeat coronary angiography in 4 to 6 weeks for evaluation for restenosis, and consider arthrectomy followed by stenting.  If remains stable, can be discharged in 24 to 48 hours. 155 mL contrast utilized. Extremely difficult procedure.  Echocardiogram 04/20/2020: 1. Left ventricular ejection fraction, by estimation, is 40 to 45%. The left ventricle has mildly decreased function. The left ventricle  demonstrates regional wall motion abnormalities (see scoring  diagram/findings for description). Left ventricular  diastolic parameters are indeterminate. There is moderate hypokinesis of  the left ventricular, basal-mid inferior wall.  2. Right ventricular systolic function is normal. The right ventricular  size is normal. There is mildly elevated pulmonary artery systolic  pressure.  3. The mitral valve is grossly normal. Moderate to severe mitral valve  regurgitation.  4. Tricuspid valve regurgitation is moderate. RA-RV peak gradient 34  mmHg.  5. Compared to previous study on 11/20/2019, LVEF is lowered from 55-65%  to 40-45%, wall motion abnormalities are new, valvular regurgitation more  prominent.   Left Heart Catheterization 05/20/2020:  PTCA and angioplasty of the circumflex coronary artery and left main coronary artery, snaring of the previous stent that was undeployed  in the left main and circumflex coronary artery.  Ulnar arteriotomy 05/20/2020: A transverse arteriotomy was made just above the stent after the proximal artery was clamped. I was able to reach down into the artery and retrieve the stent with a small hemostat.   EKG:   EKG 04/29/2020: Normal sinus rhythm with rate of 72 bpm, left axis deviation, left intrafascicular block.  Incomplete right bundle branch block.  Cannot exclude posterior infarct old.  Nonspecific T abnormality high lateral leads. Low voltage complexes.   EKG 12/19/2019: Sinus tachycardia at rate of 101 bpm, normal axis, no evidence of ischemia.  Low-voltage complexes.  No significant change from 11/15/2018.  Assessment     ICD-10-CM   1. Coronary artery disease of native artery of native heart with stable angina pectoris (HCC)  X73.532 Basic metabolic panel    CBC  2. Primary hypertension  I10   3. Hypercholesteremia  E78.00     No orders of the defined types were placed in this encounter.   There are no discontinued medications.  Recommendations:   Cody Preston  is a 54 y.o.  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 Sisters present at bedside. He has CAD and h/o angioplasty with orbital atherectomy followed by stent to the proximal LAD on 12/18/2019. Present with ACS and NSTEMI on 04/19/2020, underwent urgent coronary angiogram and balloon angioplasty to occluded large circumflex, but unable  to deliver the stent due to calcification. On 05/20/2020 repeat coronary angiography revealing a nondeployed stent in the left main and proximal circumflex, he underwent snaring and successful balloon angioplasty to the  circumflex and left main distal to the stent to snare the stent out of the coronaries and ulnar arteriotomy to retreive the stent.   I discussed the findings of the coronary angiography with the patient and his sister again and explained significant amount of calcification that is evident during the original emergent catheterization performed on the stent was lost.  They are appreciative the care.  His surgical site at the right antecubital fossa has completely healed.  He has good radial pulse.  Blood pressure is well controlled, lipids are now being aggressively managed.  As described previously 4 weeks ago, I reviewed the angiograms with my interventional colleagues and presented at the interventional conference.  Consensus was for relook at both left main and circumflex coronary artery to further evaluate the circumflex coronary artery significance stenosis due to heavy calcification.  Also patient is now having recurrence of angina pectoris, he has been taking frequent nitroglycerin, he has taken 3 sublingual nitroglycerin with immediate relief of chest pain last 1 was 2 days ago.  Hence I recommended that he proceed with coronary angiography with angioplasty to the circumflex coronary artery.  Patient sister is also present and they are willing to proceed.  He is aware of the risks associated with this.   [name], MD, [facility] 06/22/2020, 3:10 PM Office: [phone]

## 2020-06-24 ENCOUNTER — Other Ambulatory Visit: Payer: Self-pay | Admitting: Cardiology

## 2020-06-26 ENCOUNTER — Telehealth (HOSPITAL_COMMUNITY): Payer: Self-pay

## 2020-06-26 NOTE — Telephone Encounter (Signed)
Called and spoke with pt sister Hassan Rowan who stated pt is not interested at this time due to some medical issues.  Closed referral

## 2020-07-10 ENCOUNTER — Ambulatory Visit: Payer: Medicare Other | Admitting: Cardiology

## 2020-07-11 ENCOUNTER — Other Ambulatory Visit (HOSPITAL_COMMUNITY)
Admission: RE | Admit: 2020-07-11 | Discharge: 2020-07-11 | Disposition: A | Discharge status: Home / Self Care | Payer: Medicare Other | Source: Ambulatory Visit | Attending: Cardiology | Admitting: Cardiology

## 2020-07-11 DIAGNOSIS — Z20822 Contact with and (suspected) exposure to covid-19: Secondary | ICD-10-CM | POA: Diagnosis not present

## 2020-07-11 DIAGNOSIS — Z01812 Encounter for preprocedural laboratory examination: Secondary | ICD-10-CM | POA: Diagnosis not present

## 2020-07-11 DIAGNOSIS — I25118 Atherosclerotic heart disease of native coronary artery with other forms of angina pectoris: Secondary | ICD-10-CM | POA: Diagnosis not present

## 2020-07-12 LAB — CBC
Hematocrit: 33.3 % — ABNORMAL LOW (ref 37.5–51.0)
Hemoglobin: 10.8 g/dL — ABNORMAL LOW (ref 13.0–17.7)
MCH: 26.2 pg — ABNORMAL LOW (ref 26.6–33.0)
MCHC: 32.4 g/dL (ref 31.5–35.7)
MCV: 81 fL (ref 79–97)
Platelets: 420 10*3/uL (ref 150–450)
RBC: 4.12 x10E6/uL — ABNORMAL LOW (ref 4.14–5.80)
RDW: 13.1 % (ref 11.6–15.4)
WBC: 6 10*3/uL (ref 3.4–10.8)

## 2020-07-12 LAB — BASIC METABOLIC PANEL
BUN/Creatinine Ratio: 19 (ref 9–20)
BUN: 14 mg/dL (ref 6–24)
CO2: 23 mmol/L (ref 20–29)
Calcium: 9.2 mg/dL (ref 8.7–10.2)
Chloride: 97 mmol/L (ref 96–106)
Creatinine, Ser: 0.73 mg/dL — ABNORMAL LOW (ref 0.76–1.27)
GFR calc Af Amer: 122 mL/min/{1.73_m2} (ref >59)
GFR calc non Af Amer: 106 mL/min/{1.73_m2} (ref >59)
Glucose: 102 mg/dL — ABNORMAL HIGH (ref 65–99)
Potassium: 4.5 mmol/L (ref 3.5–5.2)
Sodium: 132 mmol/L — ABNORMAL LOW (ref 134–144)

## 2020-07-12 LAB — SARS CORONAVIRUS 2 (TAT 6-24 HRS): SARS Coronavirus 2: NEGATIVE

## 2020-07-14 DIAGNOSIS — I10 Essential (primary) hypertension: Secondary | ICD-10-CM | POA: Diagnosis not present

## 2020-07-14 DIAGNOSIS — I25111 Atherosclerotic heart disease of native coronary artery with angina pectoris with documented spasm: Secondary | ICD-10-CM | POA: Diagnosis not present

## 2020-07-14 DIAGNOSIS — M545 Low back pain: Secondary | ICD-10-CM | POA: Diagnosis not present

## 2020-07-14 DIAGNOSIS — E785 Hyperlipidemia, unspecified: Secondary | ICD-10-CM | POA: Diagnosis not present

## 2020-07-15 ENCOUNTER — Encounter (HOSPITAL_COMMUNITY)
Admission: RE | Disposition: A | Discharge status: Home / Self Care | Payer: Self-pay | Source: Home / Self Care | Attending: Cardiology

## 2020-07-15 ENCOUNTER — Ambulatory Visit (HOSPITAL_COMMUNITY)
Admission: RE | Admit: 2020-07-15 | Discharge: 2020-07-15 | Disposition: A | Discharge status: Home / Self Care | Payer: Medicare Other | Attending: Cardiology | Admitting: Cardiology

## 2020-07-15 ENCOUNTER — Other Ambulatory Visit: Payer: Self-pay

## 2020-07-15 ENCOUNTER — Encounter (HOSPITAL_COMMUNITY): Payer: Self-pay | Admitting: Cardiology

## 2020-07-15 DIAGNOSIS — E785 Hyperlipidemia, unspecified: Secondary | ICD-10-CM | POA: Insufficient documentation

## 2020-07-15 DIAGNOSIS — I209 Angina pectoris, unspecified: Secondary | ICD-10-CM | POA: Diagnosis not present

## 2020-07-15 DIAGNOSIS — Z8249 Family history of ischemic heart disease and other diseases of the circulatory system: Secondary | ICD-10-CM | POA: Insufficient documentation

## 2020-07-15 DIAGNOSIS — M549 Dorsalgia, unspecified: Secondary | ICD-10-CM | POA: Diagnosis not present

## 2020-07-15 DIAGNOSIS — I10 Essential (primary) hypertension: Secondary | ICD-10-CM | POA: Diagnosis not present

## 2020-07-15 DIAGNOSIS — Z79899 Other long term (current) drug therapy: Secondary | ICD-10-CM | POA: Insufficient documentation

## 2020-07-15 DIAGNOSIS — Z7982 Long term (current) use of aspirin: Secondary | ICD-10-CM | POA: Diagnosis not present

## 2020-07-15 DIAGNOSIS — I25118 Atherosclerotic heart disease of native coronary artery with other forms of angina pectoris: Secondary | ICD-10-CM | POA: Diagnosis not present

## 2020-07-15 DIAGNOSIS — E7800 Pure hypercholesterolemia, unspecified: Secondary | ICD-10-CM | POA: Diagnosis not present

## 2020-07-15 DIAGNOSIS — Z86718 Personal history of other venous thrombosis and embolism: Secondary | ICD-10-CM | POA: Insufficient documentation

## 2020-07-15 DIAGNOSIS — I252 Old myocardial infarction: Secondary | ICD-10-CM | POA: Insufficient documentation

## 2020-07-15 DIAGNOSIS — Z955 Presence of coronary angioplasty implant and graft: Secondary | ICD-10-CM | POA: Insufficient documentation

## 2020-07-15 DIAGNOSIS — I25119 Atherosclerotic heart disease of native coronary artery with unspecified angina pectoris: Secondary | ICD-10-CM | POA: Diagnosis present

## 2020-07-15 HISTORY — PX: CORONARY STENT INTERVENTION: CATH118234

## 2020-07-15 LAB — POCT ACTIVATED CLOTTING TIME
Activated Clotting Time: 252 seconds
Activated Clotting Time: 279 seconds
Activated Clotting Time: 296 seconds

## 2020-07-15 SURGERY — CORONARY STENT INTERVENTION
Anesthesia: LOCAL

## 2020-07-15 MED ORDER — SODIUM CHLORIDE 0.9 % WEIGHT BASED INFUSION: 1.0000 mL/kg/h | INTRAVENOUS | Status: DC

## 2020-07-15 MED ORDER — SODIUM CHLORIDE 0.9% FLUSH: 3.0000 mL | INTRAVENOUS | Status: DC | PRN

## 2020-07-15 MED ORDER — SODIUM CHLORIDE 0.9 % IV SOLN: 250.0000 mL | INTRAVENOUS | Status: DC | PRN

## 2020-07-15 MED ORDER — HEPARIN SODIUM (PORCINE) 1000 UNIT/ML IJ SOLN
INTRAMUSCULAR | Status: AC
  Filled 2020-07-15: qty 1

## 2020-07-15 MED ORDER — HYDRALAZINE HCL 20 MG/ML IJ SOLN: 5.0000 mg | INTRAMUSCULAR | Status: DC | PRN

## 2020-07-15 MED ORDER — HEPARIN SODIUM (PORCINE) 1000 UNIT/ML IJ SOLN
INTRAMUSCULAR | Status: DC | PRN
  Administered 2020-07-15: 2000 [IU] via INTRAVENOUS
  Administered 2020-07-15: 3000 [IU] via INTRAVENOUS
  Administered 2020-07-15: 2000 [IU] via INTRAVENOUS
  Administered 2020-07-15: 8000 [IU] via INTRAVENOUS

## 2020-07-15 MED ORDER — LIDOCAINE HCL (PF) 1 % IJ SOLN
INTRAMUSCULAR | Status: DC | PRN
  Administered 2020-07-15: 15 mL

## 2020-07-15 MED ORDER — SODIUM CHLORIDE 0.9% FLUSH: 3.0000 mL | Freq: Two times a day (BID) | INTRAVENOUS | Status: DC

## 2020-07-15 MED ORDER — FENTANYL CITRATE (PF) 100 MCG/2ML IJ SOLN
INTRAMUSCULAR | Status: AC
  Filled 2020-07-15: qty 2

## 2020-07-15 MED ORDER — MIDAZOLAM HCL 2 MG/2ML IJ SOLN
INTRAMUSCULAR | Status: AC
  Filled 2020-07-15: qty 2

## 2020-07-15 MED ORDER — ASPIRIN 81 MG PO CHEW
81.0000 mg | CHEWABLE_TABLET | ORAL | Status: AC
  Administered 2020-07-15: 81 mg via ORAL
  Filled 2020-07-15: qty 1

## 2020-07-15 MED ORDER — HEPARIN (PORCINE) IN NACL 1000-0.9 UT/500ML-% IV SOLN
INTRAVENOUS | Status: DC | PRN
  Administered 2020-07-15: 500 mL

## 2020-07-15 MED ORDER — LIDOCAINE HCL (PF) 1 % IJ SOLN
INTRAMUSCULAR | Status: AC
  Filled 2020-07-15: qty 30

## 2020-07-15 MED ORDER — ACETAMINOPHEN 325 MG PO TABS: 650.0000 mg | ORAL_TABLET | ORAL | Status: DC | PRN

## 2020-07-15 MED ORDER — FENTANYL CITRATE (PF) 100 MCG/2ML IJ SOLN
INTRAMUSCULAR | Status: DC | PRN
Start: 2020-07-15 — End: 2020-07-15
  Administered 2020-07-15 (×3): 25 ug via INTRAVENOUS

## 2020-07-15 MED ORDER — NITROGLYCERIN 1 MG/10 ML FOR IR/CATH LAB
INTRA_ARTERIAL | Status: DC | PRN
  Administered 2020-07-15: 100 ug via INTRACORONARY

## 2020-07-15 MED ORDER — IOHEXOL 350 MG/ML SOLN
INTRAVENOUS | Status: DC | PRN
  Administered 2020-07-15: 110 mL

## 2020-07-15 MED ORDER — SODIUM CHLORIDE 0.9 % WEIGHT BASED INFUSION
3.0000 mL/kg/h | INTRAVENOUS | Status: AC
  Administered 2020-07-15: 3 mL/kg/h via INTRAVENOUS

## 2020-07-15 MED ORDER — NITROGLYCERIN 1 MG/10 ML FOR IR/CATH LAB
INTRA_ARTERIAL | Status: AC
  Filled 2020-07-15: qty 10

## 2020-07-15 MED ORDER — HEPARIN (PORCINE) IN NACL 1000-0.9 UT/500ML-% IV SOLN
INTRAVENOUS | Status: AC
  Filled 2020-07-15: qty 1000

## 2020-07-15 MED ORDER — ONDANSETRON HCL 4 MG/2ML IJ SOLN: 4.0000 mg | Freq: Four times a day (QID) | INTRAMUSCULAR | Status: DC | PRN

## 2020-07-15 MED ORDER — MIDAZOLAM HCL 2 MG/2ML IJ SOLN
INTRAMUSCULAR | Status: DC | PRN
  Administered 2020-07-15: 2 mg via INTRAVENOUS
  Administered 2020-07-15 (×2): 1 mg via INTRAVENOUS

## 2020-07-15 SURGICAL SUPPLY — 24 items
BALLN SAPPHIRE 3.5X15 (BALLOONS) ×2
BALLN SAPPHIRE ~~LOC~~ 3.5X15 (BALLOONS) ×1 IMPLANT
BALLOON SAPPHIRE 3.5X15 (BALLOONS) IMPLANT
CATH INFINITI 5FR JL4 (CATHETERS) ×1 IMPLANT
CATH LAUNCHER 6FR EBU 4 (CATHETERS) ×1 IMPLANT
CATH LAUNCHER 6FR EBU3.5 (CATHETERS) IMPLANT
CATH TELEPORT (CATHETERS) ×1 IMPLANT
CATH TRAPPER 6-8F (CATHETERS) ×1 IMPLANT
CROWN DIAMONDBACK CLASSIC 1.25 (BURR) ×1 IMPLANT
DEVICE CLOSURE PERCLS PRGLD 6F (VASCULAR PRODUCTS) IMPLANT
KIT ENCORE 26 ADVANTAGE (KITS) ×1 IMPLANT
KIT HEART LEFT (KITS) ×2 IMPLANT
KIT MICROPUNCTURE NIT STIFF (SHEATH) ×1 IMPLANT
LUBRICANT VIPERSLIDE CORONARY (MISCELLANEOUS) ×1 IMPLANT
PACK CARDIAC CATHETERIZATION (CUSTOM PROCEDURE TRAY) ×2 IMPLANT
PERCLOSE PROGLIDE 6F (VASCULAR PRODUCTS) ×2
SHEATH PINNACLE 6F 10CM (SHEATH) ×1 IMPLANT
SHEATH PROBE COVER 6X72 (BAG) ×1 IMPLANT
STENT RESOLUTE ONYX 3.5X22 (Permanent Stent) ×1 IMPLANT
TRANSDUCER W/STOPCOCK (MISCELLANEOUS) ×2 IMPLANT
TUBING CIL FLEX 10 FLL-RA (TUBING) ×2 IMPLANT
WIRE COUGAR XT STRL 190CM (WIRE) ×1 IMPLANT
WIRE EMERALD 3MM-J .035X150CM (WIRE) ×1 IMPLANT
WIRE VIPERWIRE COR FLEX .012 (WIRE) ×1 IMPLANT

## 2020-07-15 NOTE — CV Procedure (Signed)
     CSI orbital atherectomy followed by 3.5 x 22 mm resolute Onyx DES, 95% reduced to 0% with TIMI-3 to TIMI-3 flow.   [name], MD, [facility] 07/15/2020, 3:14 PM Office: [phone]

## 2020-07-15 NOTE — Interval H&P Note (Signed)
History and Physical Interval Note:  07/15/2020 3:07 PM  Cody Preston  has presented today for surgery, with the diagnosis of CAD.  The various methods of treatment have been discussed with the patient and family. After consideration of risks, benefits and other options for treatment, the patient has consented to  Procedure(s): CORONARY STENT INTERVENTION (N/A) as a surgical intervention.  The patient's history has been reviewed, patient examined, no change in status, stable for surgery.  I have reviewed the patient's chart and labs.  Questions were answered to the patient's satisfaction.   Planned PCI for class 3 angina on maximal medical therapy.  [name]

## 2020-07-15 NOTE — Discharge Instructions (Signed)
Femoral Site Care This sheet gives you information about how to care for yourself after your procedure. Your health care provider may also give you more specific instructions. If you have problems or questions, contact your health care provider. What can I expect after the procedure?  After the procedure, it is common to have:  Bruising that usually fades within 1-2 weeks.  Tenderness at the site. Follow these instructions at home: Wound care 1. Follow instructions from your health care provider about how to take care of your insertion site. Make sure you: ? Wash your hands with soap and water before you change your bandage (dressing). If soap and water are not available, use hand sanitizer. ? Remove your dressing as told by your health care provider. In 24 hours 2. Do not take baths, swim, or use a hot tub until your health care provider approves. 3. You may shower 24-48 hours after the procedure or as told by your health care provider. ? Gently wash the site with plain soap and water. ? Pat the area dry with a clean towel. ? Do not rub the site. This may cause bleeding. 4. Do not apply powder or lotion to the site. Keep the site clean and dry. 5. Check your femoral site every day for signs of infection. Check for: ? Redness, swelling, or pain. ? Fluid or blood. ? Warmth. ? Pus or a bad smell. Activity 1. For the first 2-3 days after your procedure, or as long as directed: ? Avoid climbing stairs as much as possible. ? Do not squat. 2. Do not lift anything that is heavier than 5 lb, or the limit that you are told, until your health care provider says that it is safe. For 7 days 3. Rest as directed. ? Avoid sitting for a long time without moving. Get up to take short walks every 1-2 hours. 4. Do not drive for 24 hours if you were given a medicine to help you relax (sedative). General instructions  Take over-the-counter and prescription medicines only as told by your health care  provider.  Keep all follow-up visits as told by your health care provider. This is important. Contact a health care provider if you have:  A fever or chills.  You have redness, swelling, or pain around your insertion site. Get help right away if:  The catheter insertion area swells very fast.  You pass out.  You suddenly start to sweat or your skin gets clammy.  The catheter insertion area is bleeding, and the bleeding does not stop when you hold steady pressure on the area.  The area near or just beyond the catheter insertion site becomes pale, cool, tingly, or numb. These symptoms may represent a serious problem that is an emergency. Do not wait to see if the symptoms will go away. Get medical help right away. Call your local emergency services (911 in the [place] Do not drive yourself to the hospital. Summary  After the procedure, it is common to have bruising that usually fades within 1-2 weeks.  Check your femoral site every day for signs of infection.  Do not lift anything that is heavier than 5 lb, or the limit that you are told, until your health care provider says that it is safe. This information is not intended to replace advice given to you by your health care provider. Make sure you discuss any questions you have with your health care provider. Document Revised: 10/24/2017 Document Reviewed: 10/24/2017 Elsevier Patient Education  2020 [employer]   

## 2020-07-15 NOTE — Progress Notes (Signed)
CARDIAC REHAB PHASE I   Stent education reinforced with pt. Pt educated on importance of ASA and Brilinta. Reviewed restrictions, site care, and exercise guidelines. Will re-refer to CRP II [place].  [id number] Rufina Falco, RN BSN 07/15/2020 3:45 PM

## 2020-07-15 NOTE — Progress Notes (Signed)
Up and walked and tolerated well; right groin stable, bruised no bleeding or hematoma

## 2020-07-16 ENCOUNTER — Other Ambulatory Visit: Payer: Self-pay

## 2020-07-16 ENCOUNTER — Encounter (HOSPITAL_COMMUNITY): Payer: Self-pay | Admitting: Emergency Medicine

## 2020-07-16 ENCOUNTER — Emergency Department (HOSPITAL_COMMUNITY)
Admission: EM | Admit: 2020-07-16 | Discharge: 2020-07-16 | Disposition: A | Discharge status: Home / Self Care | Payer: Medicare Other | Attending: Emergency Medicine | Admitting: Emergency Medicine

## 2020-07-16 DIAGNOSIS — Z79899 Other long term (current) drug therapy: Secondary | ICD-10-CM | POA: Diagnosis not present

## 2020-07-16 DIAGNOSIS — S7011XA Contusion of right thigh, initial encounter: Secondary | ICD-10-CM | POA: Insufficient documentation

## 2020-07-16 DIAGNOSIS — Z743 Need for continuous supervision: Secondary | ICD-10-CM | POA: Diagnosis not present

## 2020-07-16 DIAGNOSIS — Z7982 Long term (current) use of aspirin: Secondary | ICD-10-CM | POA: Diagnosis not present

## 2020-07-16 DIAGNOSIS — X58XXXA Exposure to other specified factors, initial encounter: Secondary | ICD-10-CM | POA: Insufficient documentation

## 2020-07-16 DIAGNOSIS — S8011XA Contusion of right lower leg, initial encounter: Secondary | ICD-10-CM

## 2020-07-16 DIAGNOSIS — Y92009 Unspecified place in unspecified non-institutional (private) residence as the place of occurrence of the external cause: Secondary | ICD-10-CM | POA: Diagnosis not present

## 2020-07-16 DIAGNOSIS — I2510 Atherosclerotic heart disease of native coronary artery without angina pectoris: Secondary | ICD-10-CM | POA: Diagnosis not present

## 2020-07-16 DIAGNOSIS — M96840 Postprocedural hematoma of a musculoskeletal structure following a musculoskeletal system procedure: Secondary | ICD-10-CM | POA: Diagnosis not present

## 2020-07-16 DIAGNOSIS — R58 Hemorrhage, not elsewhere classified: Secondary | ICD-10-CM | POA: Diagnosis not present

## 2020-07-16 LAB — CBC
HCT: 29.4 % — ABNORMAL LOW (ref 39.0–52.0)
Hemoglobin: 9.4 g/dL — ABNORMAL LOW (ref 13.0–17.0)
MCH: 26.5 pg (ref 26.0–34.0)
MCHC: 32 g/dL (ref 30.0–36.0)
MCV: 82.8 fL (ref 80.0–100.0)
Platelets: 362 10*3/uL (ref 150–400)
RBC: 3.55 MIL/uL — ABNORMAL LOW (ref 4.22–5.81)
RDW: 13.8 % (ref 11.5–15.5)
WBC: 7.4 10*3/uL (ref 4.0–10.5)
nRBC: 0 % (ref 0.0–0.2)

## 2020-07-16 LAB — BASIC METABOLIC PANEL
Anion gap: 7 (ref 5–15)
BUN: 14 mg/dL (ref 6–20)
CO2: 25 mmol/L (ref 22–32)
Calcium: 8.9 mg/dL (ref 8.9–10.3)
Chloride: 99 mmol/L (ref 98–111)
Creatinine, Ser: 0.85 mg/dL (ref 0.61–1.24)
GFR calc Af Amer: >60 mL/min (ref >60)
GFR calc non Af Amer: >60 mL/min (ref >60)
Glucose, Bld: 129 mg/dL — ABNORMAL HIGH (ref 70–99)
Potassium: 4.2 mmol/L (ref 3.5–5.1)
Sodium: 131 mmol/L — ABNORMAL LOW (ref 135–145)

## 2020-07-16 NOTE — ED Triage Notes (Signed)
Pt from home, had stent placed using his right thigh.  He noticed some bruising and is "concerned".  Denies any other issues including pain.  118/74 83HR 98% RA

## 2020-07-16 NOTE — ED Provider Notes (Signed)
[facility] EMERGENCY DEPARTMENT Provider Note   CSN: [id number] Arrival date & time: 07/16/20  1933     History Chief Complaint  Patient presents with  . Post-op Problem    Cody Preston is a 54 y.o. male.  Patient concern for bruising of his right thigh, status post catheterization yesterday.  Stent placement in his coronary artery.  He is on aspirin and Brilinta, no other anticoagulation.  It does not cause pain with the bruising has been there, first noticed today at 5 PM.  Not worsening.  Does not affect dysfunction.  Normal bowel function normal urination.        Past Medical History:  Diagnosis Date  . Back pain   . Coronary artery disease   . Hypercholesteremia   . Hyperlipidemia     Patient Active Problem List   Diagnosis Date Noted  . CAD (coronary artery disease) 05/20/2020  . 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 Non-ST elevation (NSTEMI) myocardial infarction ([place]) 04/20/2020  . NSTEMI (non-ST elevated myocardial infarction) ([place]) 04/20/2020  . Abnormal stress test 12/18/2019  . Post PTCA 12/18/2019  . Lung nodule 02/23/2013  . Coronary artery disease involving native coronary artery of native heart with angina pectoris ([place]) 01/16/2013  . Abnormal cholesterol test 01/16/2013    Past Surgical History:  Procedure Laterality Date  . [place] REMOVAL Right 05/20/2020   Procedure: REMOVAL OF STENT RIGHT ULNAR ARTERY AND PRIMARY REPAIR OF ULNAR ARTERY;  Surgeon: Angelia Mould, MD;  Location: [place];  Service: Vascular;  Laterality: Right;  . BACK SURGERY    . BACK SURGERY    . CLUB FOOT RELEASE    . CORONARY ATHERECTOMY N/A 12/18/2019   Procedure: CORONARY ATHERECTOMY;  Surgeon: Nigel Mormon, MD;  Location: [place] [facility];  Service: Cardiovascular;  Laterality: N/A;  . CORONARY BALLOON ANGIOPLASTY N/A 05/20/2020   Procedure: CORONARY BALLOON ANGIOPLASTY;  Surgeon: [name], MD;   Location: [place] [facility];  Service: Cardiovascular;  Laterality: N/A;  . CORONARY STENT INTERVENTION  12/18/2019  . CORONARY STENT INTERVENTION N/A 12/18/2019   Procedure: CORONARY STENT INTERVENTION;  Surgeon: Nigel Mormon, MD;  Location: [place] [facility];  Service: Cardiovascular;  Laterality: N/A;  . CORONARY STENT INTERVENTION N/A 07/15/2020   Procedure: CORONARY STENT INTERVENTION;  Surgeon: [name], MD;  Location: [place] [facility];  Service: Cardiovascular;  Laterality: N/A;  . CORONARY THROMBECTOMY N/A 04/19/2020   Procedure: Coronary Thrombectomy;  Surgeon: [name], MD;  Location: [place] [facility];  Service: Cardiovascular;  Laterality: N/A;  . CORONARY/GRAFT ACUTE MI REVASCULARIZATION N/A 04/19/2020   Procedure: Coronary/Graft Acute MI Revascularization;  Surgeon: [name], MD;  Location: [place] [facility];  Service: Cardiovascular;  Laterality: N/A;  . INTRAVASCULAR ULTRASOUND/IVUS N/A 12/18/2019   Procedure: Intravascular Ultrasound/IVUS;  Surgeon: Nigel Mormon, MD;  Location: [place] [facility];  Service: Cardiovascular;  Laterality: N/A;  . LEFT HEART CATH AND CORONARY ANGIOGRAPHY N/A 12/18/2019   Procedure: LEFT HEART CATH AND CORONARY ANGIOGRAPHY;  Surgeon: Nigel Mormon, MD;  Location: [place] [facility];  Service: Cardiovascular;  Laterality: N/A;  . LEFT HEART CATH AND CORONARY ANGIOGRAPHY N/A 04/19/2020   Procedure: LEFT HEART CATH AND CORONARY ANGIOGRAPHY;  Surgeon: [name], MD;  Location: [place] [facility];  Service: Cardiovascular;  Laterality: N/A;  . SHUNT REPLACEMENT     at 27 months of age  . STENT REMOVAL  Right 05/20/2020   REMOVAL CORONARY STENT FROM RIGHT ULNAR ARTER       Family History  Problem Relation Age of Onset  . Allergies Sister   . Heart attack Mother   . Arthritis Mother   . Lung cancer Mother   . Arthritis Sister   . Alzheimer's disease Father   . Cancer Sister   . Heart attack Brother   . Hyperlipidemia  Brother     Social History   Tobacco Use  . Smoking status: Never Smoker  . Smokeless tobacco: Never Used  Vaping Use  . Vaping Use: Never used  Substance Use Topics  . Alcohol use: Not Currently  . Drug use: No    Home Medications Prior to Admission medications   Medication Sig Start Date End Date Taking? Authorizing Provider  amLODipine (NORVASC) 5 MG tablet Take 1 tablet (5 mg total) by mouth daily. 05/27/20   [name], MD  aspirin 81 MG chewable tablet Chew 1 tablet (81 mg total) by mouth daily. Patient taking differently: Chew 81 mg by mouth daily as needed (chest pain).  04/22/20   Patwardhan, Reynold Bowen, MD  diclofenac Sodium (VOLTAREN) 1 % GEL Apply 1 g topically 4 (four) times daily as needed (pain).     [provider]  ibuprofen (ADVIL) 200 MG tablet Take 200 mg by mouth 2 (two) times daily as needed for moderate pain.    [provider]  metoprolol succinate (TOPROL-XL) 50 MG 24 hr tablet Take 1 tablet (50 mg total) by mouth daily. 05/27/20   [name], MD  nitroGLYCERIN (NITROSTAT) 0.4 MG SL tablet Place 1 tablet (0.4 mg total) under the tongue every 5 (five) minutes as needed for chest pain. 04/21/20   Patwardhan, Manish J, MD  pantoprazole (PROTONIX) 40 MG tablet TAKE 1 TABLET BY MOUTH DAILY. Patient taking differently: Take 40 mg by mouth daily.  06/24/20   [name], MD  rosuvastatin (CRESTOR) 20 MG tablet Take 1 tablet (20 mg total) by mouth daily. 05/27/20   [name], MD  ticagrelor (BRILINTA) 90 MG TABS tablet Take 1 tablet (90 mg total) by mouth 2 (two) times daily. 05/27/20   [name], MD  traMADol (ULTRAM) 50 MG tablet Take 50 mg by mouth 3 (three) times daily.  05/23/20   [provider]    Allergies    Bee venom  Review of Systems   Review of Systems  Constitutional: Negative for chills and fever.  HENT: Negative for congestion and rhinorrhea.   Respiratory: Negative for cough and shortness of breath.   Cardiovascular: Negative for  chest pain and palpitations.  Gastrointestinal: Negative for diarrhea, nausea and vomiting.  Genitourinary: Negative for difficulty urinating and dysuria.  Musculoskeletal: Negative for arthralgias and back pain.  Skin: Positive for color change and wound. Negative for rash.  Neurological: Negative for light-headedness and headaches.    Physical Exam Updated Vital Signs BP 117/76 (BP Location: Right Arm)   Pulse 87   Temp 98.6 F (37 C) (Oral)   Resp (!) 21   Ht 5\' 7"  (1.702 m)   Wt 66.7 kg   SpO2 97%   BMI 23.03 kg/m   Physical Exam Vitals and nursing note reviewed.  Constitutional:      General: He is not in acute distress.    Appearance: Normal appearance.  HENT:     Head: Normocephalic and atraumatic.     Nose: No rhinorrhea.  Eyes:     General:  Right eye: No discharge.        Left eye: No discharge.     Conjunctiva/sclera: Conjunctivae normal.  Cardiovascular:     Rate and Rhythm: Normal rate and regular rhythm.     Comments: 1+ dorsalis pedis pulse Pulmonary:     Effort: Pulmonary effort is normal.     Breath sounds: No stridor.  Abdominal:     General: Abdomen is flat. There is no distension.     Palpations: Abdomen is soft.  Musculoskeletal:        General: No deformity or signs of injury.  Skin:    General: Skin is warm and dry.     Findings: Bruising (right prox thigh) present.  Neurological:     General: No focal deficit present.     Mental Status: He is alert. Mental status is at baseline.     Motor: No weakness.  Psychiatric:        Mood and Affect: Mood normal.        Behavior: Behavior normal.        Thought Content: Thought content normal.       ED Results / Procedures / Treatments   Labs (all labs ordered are listed, but only abnormal results are displayed) Labs Reviewed  CBC - Abnormal; Notable for the following components:      Result Value   RBC 3.55 (*)    Hemoglobin 9.4 (*)    HCT 29.4 (*)    All other components  within normal limits  BASIC METABOLIC PANEL - Abnormal; Notable for the following components:   Sodium 131 (*)    Glucose, Bld 129 (*)    All other components within normal limits    EKG None  Radiology CARDIAC CATHETERIZATION  Result Date: 07/15/2020 Coronary Angioplasty 07/15/20: Plan PCI to the circumflex coronary artery.  Successful orbital atherectomy of the proximal circumflex followed by stenting with 3.5 x 22 mm resolute Onyx DES of the proximal circumflex, stenosis reduced from 95% to 0% with TIMI-3 to TIMI-3 flow.  110 mL contrast utilized. Recommendation: Patient to be discharged home today with outpatient follow-up.  He will be continued on dual antiplatelet therapy.   Procedures Procedures (including critical care time)  Medications Ordered in ED Medications - No data to display  ED Course  I have reviewed the triage vital signs and the nursing notes.  Pertinent labs & imaging results that were available during my care of the patient were reviewed by me and considered in my medical decision making (see chart for details).    MDM Rules/Calculators/A&P                          Catheterization yesterday.  Right thigh bruising today.  Intact pulses in the affected extremity.  The bruise is soft nontender he has full range of motion we will check CBC we will check chemistry, he is on dual antiplatelet therapy this is likely reason for the bruise,  I spoke to the cardiologist on-call, he says the swelling is soft nontender and there intact pulses distal this is a common.  Patient has a chemistry that shows no significant derangements at this time, similar to previous.  Safe for discharge home.  Strict return precautions given vital signs stable time of discharge Final Clinical Impression(s) / ED Diagnoses Final diagnoses:  Hematoma of leg, right, initial encounter    Rx / DC Orders ED Discharge Orders    None  Breck Coons, MD 07/16/20 2149

## 2020-07-23 ENCOUNTER — Encounter: Payer: Self-pay | Admitting: Cardiology

## 2020-07-23 ENCOUNTER — Ambulatory Visit: Payer: Medicare Other | Admitting: Cardiology

## 2020-07-23 ENCOUNTER — Other Ambulatory Visit: Payer: Self-pay

## 2020-07-23 VITALS — BP 99/69 | HR 77 | Resp 15 | Ht 67.0 in | Wt 146.0 lb

## 2020-07-23 DIAGNOSIS — I10 Essential (primary) hypertension: Secondary | ICD-10-CM

## 2020-07-23 DIAGNOSIS — I25118 Atherosclerotic heart disease of native coronary artery with other forms of angina pectoris: Secondary | ICD-10-CM | POA: Diagnosis not present

## 2020-07-23 NOTE — Progress Notes (Signed)
Primary Physician/Referring:  Iona Beard, MD  Patient ID: Cody Preston, male    DOB: February 06, 1966, 54 y.o.   MRN: [id number]  Chief Complaint  Patient presents with  . Follow-up    4 week  . Coronary Artery Disease  . Post Coronary Angiogram   HPI:    Cody Preston  is a 54 y.o. 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 He has CAD and h/o angioplasty with orbital atherectomy followed by stent to the proximal LAD on 12/18/2019. Presented with ACS and NSTEMI on 04/19/2020, underwent urgent coronary angiogram and balloon angioplasty to occluded large circumflex, but unable to deliver the stent due to calcification.  On 05/20/2020 repeat coronary angiography revealed a nondeployed stent in the left main and proximal circumflex, he underwent snaring and successful balloon angioplasty to the circumflex and left main distal to the stent to snare the stent out of the coronaries and ulnar arteriotomy to retreive the stent. On 07/15/2020, he underwent coronary angioplasty due to recurrence of angina with successful orbital atherectomy of the proximal circumflex followed by stenting of the proximal circumflex.   The patient presents for one week follow up after coronary angioplasty, he is accompanied by sister. He states he is feeling great and has been asymptomatic since his procedure last week. Denies chest pain, dyspnea. He was seen in the ED on 07/16/2020 for bruising and swelling at the site femoral access, but states this has been improving. He has no other complaints. He is tolerating his medications well. Denies dizziness, lightheadedness.  Past Medical History:  Diagnosis Date  . Back pain    . Coronary artery disease   . Hypercholesteremia   . Hyperlipidemia    Past Surgical History:  Procedure Laterality Date  . [place] REMOVAL Right 05/20/2020   Procedure: REMOVAL OF STENT RIGHT ULNAR ARTERY AND PRIMARY REPAIR OF ULNAR ARTERY;  Surgeon: Angelia Mould, MD;  Location: [facility];  Service: Vascular;  Laterality: Right;  . BACK SURGERY    . BACK SURGERY    . CLUB FOOT RELEASE    . CORONARY ATHERECTOMY N/A 12/18/2019   Procedure: CORONARY ATHERECTOMY;  Surgeon: Nigel Mormon, MD;  Location: [facility];  Service: Cardiovascular;  Laterality: N/A;  . CORONARY BALLOON ANGIOPLASTY N/A 05/20/2020   Procedure: CORONARY BALLOON ANGIOPLASTY;  Surgeon: [name], MD;  Location: [facility];  Service: Cardiovascular;  Laterality: N/A;  . CORONARY STENT INTERVENTION  12/18/2019  . CORONARY STENT INTERVENTION N/A 12/18/2019   Procedure: CORONARY STENT INTERVENTION;  Surgeon: Nigel Mormon, MD;  Location: [facility];  Service: Cardiovascular;  Laterality: N/A;  . CORONARY STENT INTERVENTION N/A 07/15/2020   Procedure: CORONARY STENT INTERVENTION;  Surgeon: [name], MD;  Location: [facility];  Service: Cardiovascular;  Laterality: N/A;  . CORONARY THROMBECTOMY N/A 04/19/2020   Procedure: Coronary Thrombectomy;  Surgeon: [name], MD;  Location: [facility];  Service: Cardiovascular;  Laterality: N/A;  . CORONARY/GRAFT ACUTE MI REVASCULARIZATION N/A 04/19/2020   Procedure: Coronary/Graft Acute MI Revascularization;  Surgeon: [name], MD;  Location: [facility];  Service: Cardiovascular;  Laterality: N/A;  . INTRAVASCULAR ULTRASOUND/IVUS N/A 12/18/2019  Procedure: Intravascular Ultrasound/IVUS;  Surgeon: Nigel Mormon, MD;  Location: [facility];  Service: Cardiovascular;  Laterality: N/A;  . LEFT HEART CATH AND CORONARY ANGIOGRAPHY N/A 12/18/2019   Procedure: LEFT HEART CATH AND CORONARY ANGIOGRAPHY;  Surgeon:  Nigel Mormon, MD;  Location: [facility];  Service: Cardiovascular;  Laterality: N/A;  . LEFT HEART CATH AND CORONARY ANGIOGRAPHY N/A 04/19/2020   Procedure: LEFT HEART CATH AND CORONARY ANGIOGRAPHY;  Surgeon: [name], MD;  Location: [facility];  Service: Cardiovascular;  Laterality: N/A;  . SHUNT REPLACEMENT     at 27 months of age  . STENT REMOVAL Right 05/20/2020   REMOVAL CORONARY STENT FROM RIGHT ULNAR ARTER   Social History   Tobacco Use  . Smoking status: Never Smoker  . Smokeless tobacco: Never Used  Substance Use Topics  . Alcohol use: Not Currently   ROS  Review of Systems  Constitutional: Negative for malaise/fatigue.  Cardiovascular: Negative for chest pain, dyspnea on exertion, leg swelling, near-syncope and palpitations.  Skin:       Positive for bruising at the right proximal thigh/groin   Neurological: Negative for dizziness and light-headedness.   Objective  Blood pressure 99/69, pulse 77, resp. rate 15, height 5' 7"  (1.702 m), weight 146 lb (66.2 kg), SpO2 97 %.  Vitals with BMI 07/23/2020 07/16/2020 07/16/2020  Height 5' 7"  - -  Weight 146 lbs - -  BMI 22.63 - -  Systolic 99 335 456  Diastolic 69 81 84  Pulse 77 70 80     Physical Exam Neck:     Thyroid: No thyromegaly.  Cardiovascular:     Rate and Rhythm: Normal rate and regular rhythm.     Pulses: Intact distal pulses.          Radial pulses are 2+ on the right side and 2+ on the left side.       Posterior tibial pulses are 2+ on the right side and 2+ on the left side.     Heart sounds: Normal heart sounds. No murmur heard.  No gallop.      Comments: No leg edema, no JVD.  Pulmonary:     Effort: Pulmonary effort is normal.     Breath sounds: Normal breath sounds.  Abdominal:     General: Bowel sounds are normal.     Palpations: Abdomen is soft.  Musculoskeletal:     Cervical back: Neck supple.  Skin:    Comments: Resolving bruise at the right proximal thigh/groin      Laboratory examination:   Recent Labs    05/21/20 0427 07/11/20 1424 07/16/20 2054  NA 133* 132* 131*  K 3.6 4.5 4.2  CL 99 97 99  CO2 24 23 25   GLUCOSE 160* 102* 129*  BUN 13 14 14   CREATININE 1.02 0.73* 0.85  CALCIUM 9.0 9.2 8.9  GFRNONAA >60 106 >60  GFRAA >60 122 >60   estimated creatinine clearance is 94 mL/min (by C-G formula based on SCr of 0.85 mg/dL).  CMP Latest Ref Rng & Units 07/16/2020 07/11/2020 05/21/2020  Glucose 70 - 99 mg/dL 129(H) 102(H) 160(H)  BUN 6 - 20 mg/dL 14 14 13   Creatinine 0.61 - 1.24 mg/dL 0.85 0.73(L) 1.02  Sodium 135 - 145 mmol/L 131(L) 132(L) 133(L)  Potassium 3.5 - 5.1 mmol/L 4.2 4.5 3.6  Chloride 98 - 111 mmol/L 99 97 99  CO2 22 - 32 mmol/L 25 23 24   Calcium 8.9 - 10.3  mg/dL 8.9 9.2 9.0   CBC Latest Ref Rng & Units 07/16/2020 07/11/2020 05/21/2020  WBC 4.0 - 10.5 K/uL 7.4 6.0 6.4  Hemoglobin 13.0 - 17.0 g/dL 9.4(L) 10.8(L) 10.2(L)  Hematocrit 39 - 52 % 29.4(L) 33.3(L) 31.3(L)  Platelets 150 - 400 K/uL 362 420 279   Lipid Panel Recent Labs    04/21/20 0320  CHOL 128  TRIG 69  LDLCALC 52  VLDL 14  HDL 62  CHOLHDL 2.1   HEMOGLOBIN A1C Lab Results  Component Value Date   HGBA1C 5.9 (H) 04/21/2020   MPG 122.63 04/21/2020   TSH No results for input(s): TSH in the last 8760 hours.  Outside labs: 04/17/2019 Cholesterol, total 162.000 m  HDL 71.000 mg 04/17/2019 LDL 77.000 mg 04/17/2019 Triglycerides 63.000 mg 04/17/2019  Serum glucose 90 mg, BUN 13, creatinine 0.86, EGFR >100 mL.  Potassium 4.4.  Sodium 133.  CMP otherwise normal.  Medications and allergies   Allergies  Allergen Reactions  . Bee Venom Swelling and Other (See Comments)    Finger became blue and he had to be rushed to a hospital  . Wasp Venom Swelling and Other (See Comments)    Skin turns black and blue at site stung, also     Current Outpatient Medications on File Prior to Visit  Medication Sig Dispense Refill  . aspirin 81 MG chewable tablet Chew 1  tablet (81 mg total) by mouth daily. 30 tablet 2  . diclofenac Sodium (VOLTAREN) 1 % GEL Apply 1 g topically 4 (four) times daily as needed (pain).     Marland Kitchen ibuprofen (ADVIL) 200 MG tablet Take 200 mg by mouth 2 (two) times daily as needed for moderate pain.    . metoprolol succinate (TOPROL-XL) 50 MG 24 hr tablet Take 1 tablet (50 mg total) by mouth daily. 90 tablet 1  . nitroGLYCERIN (NITROSTAT) 0.4 MG SL tablet Place 1 tablet (0.4 mg total) under the tongue every 5 (five) minutes as needed for chest pain. 30 tablet 2  . pantoprazole (PROTONIX) 40 MG tablet TAKE 1 TABLET BY MOUTH DAILY. (Patient taking differently: Take 40 mg by mouth daily. ) 90 tablet 3  . rosuvastatin (CRESTOR) 20 MG tablet Take 1 tablet (20 mg total) by mouth daily. 90 tablet 1  . ticagrelor (BRILINTA) 90 MG TABS tablet Take 1 tablet (90 mg total) by mouth 2 (two) times daily. 180 tablet 2  . traMADol (ULTRAM) 50 MG tablet Take 50 mg by mouth 3 (three) times daily as needed (for pain).      No current facility-administered medications on file prior to visit.    Radiology:  No results found.  Cardiac Studies:   Lexiscan Tetrofosmin Stress Test  11/19/2019: Abnormal ECG stress. Intravenous Lexiscan protocol. Resting EKG/ECG demonstrated normal sinus rhythm. Peak EKG/ECG revealed 1 mm horizontal ST depression of the inferolateral leads. During infusion the patient developed no chest pain, and exhibited arm pain, dizziness, jaw pain, & headache. Large extent perfusion defect consistent with severe ischemia in the mid to distal anterior and anterolateral wall in LAD distribution.  Gated SPECT imaging of the left ventricle demonstrating akinesis in the same region.  Stress LVEF moderately depressed at 37%.  No previous exam available for comparison. High risk study.   Coronary angiogram and intervention 04/20/2020: LV: Moderate to severely elevated LVEDP of 25 mmHg. No pressure gradient across the aortic valve. LVEF 50%  without wall motion abnormality in the RAO projection. No significant MR. RCA: Large caliber vessel, widely  patent. Mild disease. Left main: Mildly calcified but no significant disease. LAD: Large caliber vessel. Previously placed stent in the proximal LAD in February 2021 is widely patent (stenting 3.0 x 46 mm and a 3.0 x 18 mm resolute Onyx DES). D1 is occluded and that is chronic. Faint ipsilateral collaterals are evident. A moderate-sized D2 is widely patent. Circumflex: Occluded in the proximal segment. Mild to moderate amount of calcification evident. SP thrombectomy with Pronto V4 catheter followed by balloon angioplasty with a 2.5 x 12 mm sapphire balloon. In spite of utilization of multiple guidewires including cougar XT, mailman, and also utilization of guide liner for support. Suspect significant amount of calcification, leading to difficulty in delivering the stents. A 3.5 x 22 mm resolute Onyx and a 3.5 x 18 mm Orsero stents were attempted to cross.  TIMI-3 flow was established with minimal residual stenosis of 5 to 10%.  Recommendation: Patient will be started on aspirin, indefinitely and Brilinta for at least a year. Could consider repeat coronary angiography in 4 to 6 weeks for evaluation for restenosis, and consider arthrectomy followed by stenting.  If remains stable, can be discharged in 24 to 48 hours. 155 mL contrast utilized. Extremely difficult procedure.  Echocardiogram 04/20/2020: 1. Left ventricular ejection fraction, by estimation, is 40 to 45%. The left ventricle has mildly decreased function. The left ventricle  demonstrates regional wall motion abnormalities (see scoring  diagram/findings for description). Left ventricular  diastolic parameters are indeterminate. There is moderate hypokinesis of  the left ventricular, basal-mid inferior wall.  2. Right ventricular systolic function is normal. The right ventricular  size is normal. There is mildly  elevated pulmonary artery systolic  pressure.  3. The mitral valve is grossly normal. Moderate to severe mitral valve  regurgitation.  4. Tricuspid valve regurgitation is moderate. RA-RV peak gradient 34  mmHg.  5. Compared to previous study on 11/20/2019, LVEF is lowered from 55-65%  to 40-45%, wall motion abnormalities are new, valvular regurgitation more  prominent.   Left Heart Catheterization 05/20/2020:  PTCA and angioplasty of the circumflex coronary artery and left main coronary artery, snaring of the previous stent that was undeployed in the left main and circumflex coronary artery.  Ulnar arteriotomy 05/20/2020: A transverse arteriotomy was made just above the stent after the proximal artery was clamped. I was able to reach down into the artery and retrieve the stent with a small hemostat.   Coronary Angioplasty 07/15/20: Planned  PCI to the circumflex coronary artery.  Successful orbital atherectomy of the proximal circumflex followed by stenting with 3.5 x 22 mm resolute Onyx DES of the proximal circumflex, stenosis reduced from 95% to 0% with TIMI-3 to TIMI-3 flow. 110 mL contrast utilized.  Recommendation: Patient to be discharged home today with outpatient follow-up.  He will be continued on dual antiplatelet therapy.  EKG:   EKG 07/23/2020: Normal sinus rhythm at rate of 68 bpm, normal axis. No evidence of ischemia, Non specific T abnormality.  EKG 04/29/2020: Normal sinus rhythm with rate of 72 bpm, left axis deviation, left intrafascicular block.  Incomplete right bundle branch block.  Cannot exclude posterior infarct old.  Nonspecific T abnormality high lateral leads. Low voltage complexes.   Assessment     ICD-10-CM   1. Coronary artery disease of native artery of native heart with stable angina pectoris ([place])  I25.118 EKG 12-Lead  2. Primary hypertension  I10     No orders of the defined types were placed in this encounter.  Medications Discontinued During This  Encounter  Medication Reason  . amLODipine (NORVASC) 5 MG tablet Completed Course    Recommendations:   Cody Preston  is a 54 y.o. 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 Sisters present at bedside. He has CAD and h/o angioplasty with orbital atherectomy followed by stent to the proximal LAD on 12/18/2019. Present with ACS and NSTEMI on 04/19/2020, underwent urgent coronary angiogram and balloon angioplasty to occluded large circumflex, but unable to deliver the stent due to calcification. On 05/20/2020 repeat coronary angiography revealing a nondeployed stent in the left main and proximal circumflex, he underwent snaring and successful balloon angioplasty to the circumflex and left main distal to the stent to snare the stent out of the coronaries and ulnar arteriotomy to retreive the stent. On 07/15/2020, he underwent coronary angioplasty due to recurrence of angina with successful orbital atherectomy of the proximal circumflex followed by stenting of the proximal circumflex.   He presents today for one week follow up after coronary angioplasty. He has been doing very well since the procedure. He has been asymptomatic without any chest pain or dyspnea. He is now on aspirin and Brilinta and tolerating these without side effects. On 07/16/2020 he presented to the ED for evaluation of swelling and bruising at the femoral access site, without hematoma, however on examination today this is resolving. His blood pressure is low-normal, he is only on Metoprolol, he is asymptomatic with this. I will discontinue amlodipine (was stopped in hospital due to low BP). His labs were reviewed, lipids are well  controlled on Crestor.   We will see him back in 6 months for follow up, unless he has any issues sooner.   [name], MD, [facility] 07/23/2020, 5:16 PM Office: [phone]

## 2020-07-28 ENCOUNTER — Telehealth: Payer: Self-pay

## 2020-07-28 NOTE — Telephone Encounter (Signed)
He should not take Advil same as motrin regularly. If needed for occasional use, Naproxen OTC 200 to 400 mg once a day can be okay, but to take ASA 1 hour before the medication. Safe to use Tylenol and Ultram (needs Rx for ultram)

## 2020-07-28 NOTE — Telephone Encounter (Signed)
Received a call from patient regarding medication interactions. Patient would like to know if it is safe to take Aspirin, Advil and Brilinta at the same time. If not, patient would like to know if it is safe to take Advil and Brilinta together. Please advise. Thanks!

## 2020-07-28 NOTE — Telephone Encounter (Signed)
Left vm to cb.

## 2020-07-29 ENCOUNTER — Telehealth: Payer: Self-pay

## 2020-07-29 NOTE — Telephone Encounter (Signed)
Relayed information to patient regarding pain management. Patient voiced understanding.

## 2020-10-22 DIAGNOSIS — I25111 Atherosclerotic heart disease of native coronary artery with angina pectoris with documented spasm: Secondary | ICD-10-CM | POA: Diagnosis not present

## 2020-10-22 DIAGNOSIS — M545 Low back pain, unspecified: Secondary | ICD-10-CM | POA: Diagnosis not present

## 2020-10-22 DIAGNOSIS — E785 Hyperlipidemia, unspecified: Secondary | ICD-10-CM | POA: Diagnosis not present

## 2020-11-05 DIAGNOSIS — L720 Epidermal cyst: Secondary | ICD-10-CM | POA: Diagnosis not present

## 2020-11-11 DIAGNOSIS — L720 Epidermal cyst: Secondary | ICD-10-CM | POA: Diagnosis not present

## 2020-11-29 ENCOUNTER — Other Ambulatory Visit: Payer: Self-pay | Admitting: Cardiology

## 2020-12-02 DIAGNOSIS — L720 Epidermal cyst: Secondary | ICD-10-CM | POA: Diagnosis not present

## 2020-12-04 ENCOUNTER — Other Ambulatory Visit: Payer: Self-pay

## 2020-12-04 MED ORDER — PANTOPRAZOLE SODIUM 40 MG PO TBEC
40.0000 mg | DELAYED_RELEASE_TABLET | Freq: Every day | ORAL | 3 refills | Status: DC
Start: 2020-12-04 — End: 2021-09-25

## 2020-12-16 DIAGNOSIS — L0201 Cutaneous abscess of face: Secondary | ICD-10-CM | POA: Diagnosis not present

## 2021-01-20 DIAGNOSIS — E782 Mixed hyperlipidemia: Secondary | ICD-10-CM | POA: Diagnosis not present

## 2021-01-20 DIAGNOSIS — I25111 Atherosclerotic heart disease of native coronary artery with angina pectoris with documented spasm: Secondary | ICD-10-CM | POA: Diagnosis not present

## 2021-01-20 DIAGNOSIS — E785 Hyperlipidemia, unspecified: Secondary | ICD-10-CM | POA: Diagnosis not present

## 2021-01-20 DIAGNOSIS — M549 Dorsalgia, unspecified: Secondary | ICD-10-CM | POA: Diagnosis not present

## 2021-01-22 ENCOUNTER — Ambulatory Visit: Payer: Medicare Other | Admitting: Cardiology

## 2021-01-22 ENCOUNTER — Encounter: Payer: Self-pay | Admitting: Cardiology

## 2021-01-22 ENCOUNTER — Other Ambulatory Visit: Payer: Self-pay

## 2021-01-22 VITALS — BP 125/75 | HR 84 | Temp 98.4°F | Resp 17 | Ht 67.0 in | Wt 150.0 lb

## 2021-01-22 DIAGNOSIS — K2100 Gastro-esophageal reflux disease with esophagitis, without bleeding: Secondary | ICD-10-CM

## 2021-01-22 DIAGNOSIS — I25118 Atherosclerotic heart disease of native coronary artery with other forms of angina pectoris: Secondary | ICD-10-CM

## 2021-01-22 DIAGNOSIS — E7800 Pure hypercholesterolemia, unspecified: Secondary | ICD-10-CM | POA: Diagnosis not present

## 2021-01-22 NOTE — Progress Notes (Addendum)
Primary Physician/Referring:  Iona Beard, MD  Patient ID: Cody Preston, male    DOB: 1966/05/19, 55 y.o.   MRN: [id number]  Chief Complaint  Patient presents with  . Follow-up    6 MONTHS  . Coronary Artery Disease   HPI:    Cody Preston  is a 55 y.o. 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   He has CAD and h/o Presented with ACS and NSTEMI on 04/19/2020, underwent urgent coronary angiogram and balloon angioplasty to occluded large circumflex, repeat angiography on 07/15/2020 due to recurrence of angina with successful orbital atherectomy of the proximal circumflex followed by stenting of the proximal circumflex, staged PCI with orbital atherectomy followed by stent to the proximal LAD on 12/18/2019.   This is 15-monthoffice visit, patient states that he is still having dyspnea on exertion and occasional episodes of chest discomfort.  He is also having severe GERD, states that he would like to take pantoprazole at least 3 times a day if possible as he gets heartburn all the way to his throat.   Past Medical History:  Diagnosis Date  . Back pain   . Coronary artery disease   . Hypercholesteremia   . Hyperlipidemia    Past Surgical History:  Procedure Laterality Date  . [place]   Procedure: REMOVAL OF STENT RIGHT ULNAR ARTERY AND PRIMARY REPAIR OF ULNAR ARTERY;  Surgeon: DAngelia Mould MD;  [facility]  Service: Vascular;  Laterality: Right;  . BACK SURGERY    . BACK SURGERY    . CLUB FOOT RELEASE    . CORONARY ATHERECTOMY N/A 12/18/2019   Procedure: CORONARY ATHERECTOMY;  Surgeon: PNigel Mormon MD;  [facility];  Service: Cardiovascular;  Laterality: N/A;  . CORONARY BALLOON ANGIOPLASTY N/A 05/20/2020   Procedure: CORONARY BALLOON ANGIOPLASTY;  Surgeon: GAdrian Prows MD;  [facility];  Service: Cardiovascular;  Laterality: N/A;  . CORONARY STENT INTERVENTION  12/18/2019  . CORONARY STENT INTERVENTION N/A 12/18/2019   Procedure: CORONARY STENT INTERVENTION;  Surgeon: PNigel Mormon MD;  [facility];  Service: Cardiovascular;  Laterality: N/A;  . CORONARY STENT INTERVENTION N/A 07/15/2020   Procedure: CORONARY STENT INTERVENTION;  Surgeon: GAdrian Prows MD;  [facility];  Service: Cardiovascular;  Laterality: N/A;  . CORONARY THROMBECTOMY N/A 04/19/2020   Procedure: Coronary Thrombectomy;  Surgeon: GAdrian Prows MD;  [facility];  Service: Cardiovascular;  Laterality: N/A;  . CORONARY/GRAFT ACUTE MI REVASCULARIZATION N/A 04/19/2020   Procedure: Coronary/Graft Acute MI Revascularization;  Surgeon: GAdrian Prows MD;  [facility];  Service: Cardiovascular;  Laterality: N/A;  . INTRAVASCULAR ULTRASOUND/IVUS N/A 12/18/2019   Procedure: Intravascular Ultrasound/IVUS;  Surgeon: PNigel Mormon MD;  [facility];  Service: Cardiovascular;  Laterality: N/A;  . LEFT HEART CATH AND CORONARY ANGIOGRAPHY N/A 12/18/2019   Procedure: LEFT HEART CATH AND CORONARY ANGIOGRAPHY;  Surgeon: PNigel Mormon MD;  [facility];  Service: Cardiovascular;  Laterality: N/A;  . LEFT HEART CATH AND CORONARY ANGIOGRAPHY N/A 04/19/2020   Procedure: LEFT HEART  CATH AND CORONARY ANGIOGRAPHY;  Surgeon: [name], MD;  Location: [facility];  Service: Cardiovascular;  Laterality: N/A;  . SHUNT REPLACEMENT     at 41 months of age  . STENT REMOVAL Right 05/20/2020   REMOVAL CORONARY STENT FROM RIGHT ULNAR ARTER   Social History   Tobacco Use  . Smoking status: Never Smoker  . Smokeless  tobacco: Never Used  Substance Use Topics  . Alcohol use: Not Currently   ROS  Review of Systems  Constitutional: Negative for malaise/fatigue.  Cardiovascular: Positive for chest pain and dyspnea on exertion. Negative for leg swelling, near-syncope and palpitations.  Skin:       Positive for bruising at the right proximal thigh/groin   Musculoskeletal: Positive for arthritis and back pain.  Neurological: Negative for dizziness and light-headedness.   Objective  Blood pressure 125/75, pulse 84, temperature 98.4 F (36.9 C), temperature source Temporal, resp. rate 17, height _0  (1.702 m), weight 150 lb (68 kg), SpO2 98 %.  Vitals with BMI 01/22/2021 07/23/2020 07/16/2020  Height _1  _2  -  Weight 150 lbs 146 lbs -  BMI 59.09 31.12 -  Systolic 162 99 446  Diastolic 75 69 81  Pulse 84 77 70     Physical Exam Neck:     Thyroid: No thyromegaly.  Cardiovascular:     Rate and Rhythm: Normal rate and regular rhythm.     Pulses: Intact distal pulses.          Radial pulses are 2+ on the right side and 2+ on the left side.       Posterior tibial pulses are 2+ on the right side and 2+ on the left side.     Heart sounds: Normal heart sounds. No murmur heard. No gallop.      Comments: No leg edema, no JVD.  Pulmonary:     Effort: Pulmonary effort is normal.     Breath sounds: Normal breath sounds.  Abdominal:     General: Bowel sounds are normal.     Palpations: Abdomen is soft.  Musculoskeletal:     Cervical back: Neck supple.  Skin:    Comments: Resolving bruise at the right proximal thigh/groin     Laboratory examination:   Recent Labs    05/21/20 0427 07/11/20 1424 07/16/20 2054  NA 133* 132* 131*  K 3.6 4.5 4.2  CL 99 97 99  CO2 _3 GLUCOSE 160* 102* 129*  BUN _4 CREATININE 1.02 0.73* 0.85  CALCIUM 9.0 9.2 8.9  GFRNONAA >60 106 >60  GFRAA >60 122 >60   CrCl cannot be calculated (Patient's most recent lab result is older than the maximum 21  days allowed.).  CMP Latest Ref Rng & Units 07/16/2020 07/11/2020 05/21/2020  Glucose 70 - 99 mg/dL 129(H) 102(H) 160(H)  BUN 6 - 20 mg/dL _5 Creatinine 0.61 - 1.24 mg/dL 0.85 0.73(L) 1.02  Sodium 135 - 145 mmol/L 131(L) 132(L) 133(L)  Potassium 3.5 - 5.1 mmol/L 4.2 4.5 3.6  Chloride 98 - 111 mmol/L 99 97 99  CO2 22 - 32 mmol/L _6 Calcium 8.9 - 10.3 mg/dL 8.9 9.2 9.0   CBC Latest Ref Rng & Units 07/16/2020 07/11/2020 05/21/2020  WBC 4.0 - 10.5 K/uL 7.4 6.0 6.4  Hemoglobin 13.0 - 17.0 g/dL 9.4(L) 10.8(L) 10.2(L)  Hematocrit 39.0 - 52.0 % 29.4(L) 33.3(L) 31.3(L)  Platelets 150 - 400 K/uL 362 420 279  Lipid Panel Recent Labs    04/21/20 0320  CHOL 128  TRIG 69  LDLCALC 52  VLDL 14  HDL 62  CHOLHDL 2.1   HEMOGLOBIN A1C Lab Results  Component Value Date   HGBA1C 5.9 (H) 04/21/2020   MPG 122.63 04/21/2020   TSH No results for input(s): TSH in the last 8760 hours.  Outside labs: 04/17/2019   Labs 01/20/2021:  Total cholesterol 116, triglycerides 55, HDL 63, LDL 39.  Non-HDL cholesterol 53.  Serum glucose 82 mg, BUN 12, creatinine 0.74, EGFR 105 mill, potassium 4.1.  CMP normal.  Hb 10.6/HCT 34.2, platelets 410.  Microcytic indicis.  Cholesterol, total 162.000 m  HDL 71.000 mg 04/17/2019 LDL 77.000 mg 04/17/2019 Triglycerides 63.000 mg 04/17/2019  Serum glucose 90 mg, BUN 13, creatinine 0.86, EGFR >100 mL.  Potassium 4.4.  Sodium 133.  CMP otherwise normal.  Medications and allergies   Allergies  Allergen Reactions  . Bee Venom Swelling and Other (See Comments)    Finger became blue and he had to be rushed to a hospital  . Wasp Venom Swelling and Other (See Comments)    Skin turns black and blue at site stung, also     Current Outpatient Medications on File Prior to Visit  Medication Sig Dispense Refill  . aspirin 81 MG chewable tablet Chew 1 tablet (81 mg total) by mouth daily. 30 tablet 2  . diclofenac Sodium (VOLTAREN) 1 % GEL Apply 1 g topically 4  (four) times daily as needed (pain).     . metoprolol succinate (TOPROL-XL) 50 MG 24 hr tablet TAKE 1 TABLET BY MOUTH DAILY. 90 tablet 1  . nitroGLYCERIN (NITROSTAT) 0.4 MG SL tablet Place 1 tablet (0.4 mg total) under the tongue every 5 (five) minutes as needed for chest pain. 30 tablet 2  . pantoprazole (PROTONIX) 40 MG tablet Take 1 tablet (40 mg total) by mouth daily. 90 tablet 3  . rosuvastatin (CRESTOR) 20 MG tablet TAKE 1 TABLET BY MOUTH DAILY. 90 tablet 1  . ticagrelor (BRILINTA) 90 MG TABS tablet Take 1 tablet (90 mg total) by mouth 2 (two) times daily. 180 tablet 2  . traMADol (ULTRAM) 50 MG tablet Take 50 mg by mouth 3 (three) times daily as needed (for pain).      No current facility-administered medications on file prior to visit.    Radiology:  No results found.  Cardiac Studies:   Lexiscan Tetrofosmin Stress Test  11/19/2019: Abnormal ECG stress. Intravenous Lexiscan protocol. Resting EKG/ECG demonstrated normal sinus rhythm. Peak EKG/ECG revealed 1 mm horizontal ST depression of the inferolateral leads. During infusion the patient developed no chest pain, and exhibited arm pain, dizziness, jaw pain, & headache. Large extent perfusion defect consistent with severe ischemia in the mid to distal anterior and anterolateral wall in LAD distribution.  Gated SPECT imaging of the left ventricle demonstrating akinesis in the same region.  Stress LVEF moderately depressed at 37%.  No previous exam available for comparison. High risk study.   Coronary angiogram and intervention 04/20/2020: LV: Moderate to severely elevated LVEDP of 25 mmHg. No pressure gradient across the aortic valve. LVEF 50% without wall motion abnormality in the RAO projection. No significant MR. RCA: Large caliber vessel, widely patent. Mild disease. Left main: Mildly calcified but no significant disease. LAD: Large caliber vessel. Previously placed stent in the proximal LAD in February 2021 is widely patent  (stenting 3.0 x 46 mm and a 3.0 x 18 mm resolute Onyx DES). D1 is  occluded and that is chronic. Faint ipsilateral collaterals are evident. A moderate-sized D2 is widely patent. Circumflex: Occluded in the proximal segment. Mild to moderate amount of calcification evident. SP thrombectomy with Pronto V4 catheter followed by balloon angioplasty with a 2.5 x 12 mm sapphire balloon. In spite of utilization of multiple guidewires including cougar XT, mailman, and also utilization of guide liner for support. Suspect significant amount of calcification, leading to difficulty in delivering the stents. A 3.5 x 22 mm resolute Onyx and a 3.5 x 18 mm Orsero stents were attempted to cross.  TIMI-3 flow was established with minimal residual stenosis of 5 to 10%.  Recommendation: Patient will be started on aspirin, indefinitely and Brilinta for at least a year. Could consider repeat coronary angiography in 4 to 6 weeks for evaluation for restenosis, and consider arthrectomy followed by stenting.  If remains stable, can be discharged in 24 to 48 hours. 155 mL contrast utilized. Extremely difficult procedure.  Echocardiogram 04/20/2020: 1. Left ventricular ejection fraction, by estimation, is 40 to 45%. The left ventricle has mildly decreased function. The left ventricle  demonstrates regional wall motion abnormalities (see scoring  diagram/findings for description). Left ventricular  diastolic parameters are indeterminate. There is moderate hypokinesis of  the left ventricular, basal-mid inferior wall.  2. Right ventricular systolic function is normal. The right ventricular  size is normal. There is mildly elevated pulmonary artery systolic  pressure.  3. The mitral valve is grossly normal. Moderate to severe mitral valve  regurgitation.  4. Tricuspid valve regurgitation is moderate. RA-RV peak gradient 34  mmHg.  5. Compared to previous study on 11/20/2019, LVEF is lowered from 55-65%  to 40-45%,  wall motion abnormalities are new, valvular regurgitation more  prominent.   Left Heart Catheterization 05/20/2020:  PTCA and angioplasty of the circumflex coronary artery and left main coronary artery, snaring of the previous stent that was undeployed in the left main and circumflex coronary artery.  Ulnar arteriotomy 05/20/2020: A transverse arteriotomy was made just above the stent after the proximal artery was clamped. I was able to reach down into the artery and retrieve the stent with a small hemostat.   Coronary Angioplasty 07/15/20: Planned  PCI to the circumflex coronary artery.  Successful orbital atherectomy of the proximal circumflex followed by stenting with 3.5 x 22 mm resolute Onyx DES of the proximal circumflex, stenosis reduced from 95% to 0% with TIMI-3 to TIMI-3 flow. 110 mL contrast utilized.  Recommendation: Patient to be discharged home today with outpatient follow-up.  He will be continued on dual antiplatelet therapy.  EKG:    EKG 01/22/2021: Normal sinus rhythm at rate of 72 bpm, left atrial enlargement, normal axis.  Incomplete right bundle branch block.  No evidence of ischemia.  No significant change from 07/23/2020.     Assessment     ICD-10-CM   1. Coronary artery disease of native artery of native heart with stable angina pectoris ([place])  I25.118 EKG 12-Lead  2. Hypercholesteremia  E78.00   3. Gastroesophageal reflux disease with esophagitis without hemorrhage  K21.00 Ambulatory referral to Gastroenterology    No orders of the defined types were placed in this encounter.   Medications Discontinued During This Encounter  Medication Reason  . ibuprofen (ADVIL) 200 MG tablet Error    Recommendations:   Cody Preston  is a 55 y.o. 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   He has CAD and h/o Presented with ACS and NSTEMI on 04/19/2020, underwent urgent coronary angiogram and balloon angioplasty to occluded large circumflex, repeat angiography on 07/15/2020 due to recurrence of angina with successful orbital atherectomy of the proximal circumflex followed by stenting of the proximal circumflex, staged PCI with orbital atherectomy followed by stent to the proximal LAD on 2/23 Dr. Carol Ada for evaluation of this.  2021.  This is 12-monthoffice visit, patient states that he is still having dyspnea on exertion and occasional episodes of chest discomfort.  He is also having severe GERD, states that he would like to take pantoprazole at least 3 times a day if possible as he gets heartburn all the way to his throat.  I make a referral to see Dr. JMar DaringMann/Dr. HBenson Norwayto follow-up on this.  With regard to coronary artery disease, he is having stable angina pectoris and chronic dyspnea, advised him that as he has finished 1 year course of dual antiplatelet therapy, to discontinue Brilinta.  Patient is skeptical of stopping this as he thinks that the stoppage will bring angina again.  Advised him that he can start with taking this once a day for a week and slowly wean himself off of Brilinta but to make sure that he continues to take aspirin indefinitely.  I have not discontinued this on my medication list for now and will confirm this on his next visit.  Labs were recently performed by Dr. GFrench Ana and I will obtain this from her records, previously his lipids are well controlled and CBC and CMP were stable.  I will see him back in 2 months for follow-up of his angina dyspnea for instability.     JAdrian Prows MD, [facility]3/31/2022, 2:02 PM Office: [phone]  Addendum: Labs reviewed, lipids under excellent control, renal  function is normal.  Chronic anemia persist.  Microcytic indicis.  Hopefully discontinuing Brilinta will also help.

## 2021-03-02 ENCOUNTER — Other Ambulatory Visit: Payer: Self-pay | Admitting: Cardiology

## 2021-03-19 ENCOUNTER — Other Ambulatory Visit: Payer: Self-pay

## 2021-03-19 ENCOUNTER — Encounter (HOSPITAL_COMMUNITY): Payer: Self-pay | Admitting: *Deleted

## 2021-03-19 ENCOUNTER — Emergency Department (HOSPITAL_COMMUNITY)
Admission: EM | Admit: 2021-03-19 | Discharge: 2021-03-19 | Disposition: A | Discharge status: Home / Self Care | Payer: Medicare Other | Attending: Physician Assistant | Admitting: Physician Assistant

## 2021-03-19 DIAGNOSIS — K5900 Constipation, unspecified: Secondary | ICD-10-CM | POA: Insufficient documentation

## 2021-03-19 DIAGNOSIS — Z5321 Procedure and treatment not carried out due to patient leaving prior to being seen by health care provider: Secondary | ICD-10-CM | POA: Diagnosis not present

## 2021-03-19 LAB — CBC WITH DIFFERENTIAL/PLATELET
Abs Immature Granulocytes: 0.02 10*3/uL (ref 0.00–0.07)
Basophils Absolute: 0.1 10*3/uL (ref 0.0–0.1)
Basophils Relative: 1 %
Eosinophils Absolute: 0 10*3/uL (ref 0.0–0.5)
Eosinophils Relative: 0 %
HCT: 35.6 % — ABNORMAL LOW (ref 39.0–52.0)
Hemoglobin: 10.9 g/dL — ABNORMAL LOW (ref 13.0–17.0)
Immature Granulocytes: 0 %
Lymphocytes Relative: 23 %
Lymphs Abs: 1.1 10*3/uL (ref 0.7–4.0)
MCH: 22.6 pg — ABNORMAL LOW (ref 26.0–34.0)
MCHC: 30.6 g/dL (ref 30.0–36.0)
MCV: 73.9 fL — ABNORMAL LOW (ref 80.0–100.0)
Monocytes Absolute: 0.5 10*3/uL (ref 0.1–1.0)
Monocytes Relative: 10 %
Neutro Abs: 3.2 10*3/uL (ref 1.7–7.7)
Neutrophils Relative %: 66 %
Platelets: 601 10*3/uL — ABNORMAL HIGH (ref 150–400)
RBC: 4.82 MIL/uL (ref 4.22–5.81)
RDW: 16.4 % — ABNORMAL HIGH (ref 11.5–15.5)
WBC: 4.8 10*3/uL (ref 4.0–10.5)
nRBC: 0 % (ref 0.0–0.2)

## 2021-03-19 LAB — COMPREHENSIVE METABOLIC PANEL
ALT: 13 U/L (ref 0–44)
AST: 18 U/L (ref 15–41)
Albumin: 3.6 g/dL (ref 3.5–5.0)
Alkaline Phosphatase: 64 U/L (ref 38–126)
Anion gap: 11 (ref 5–15)
BUN: 15 mg/dL (ref 6–20)
CO2: 24 mmol/L (ref 22–32)
Calcium: 8.8 mg/dL — ABNORMAL LOW (ref 8.9–10.3)
Chloride: 93 mmol/L — ABNORMAL LOW (ref 98–111)
Creatinine, Ser: 1.03 mg/dL (ref 0.61–1.24)
GFR, Estimated: >60 mL/min (ref >60)
Glucose, Bld: 121 mg/dL — ABNORMAL HIGH (ref 70–99)
Potassium: 4 mmol/L (ref 3.5–5.1)
Sodium: 128 mmol/L — ABNORMAL LOW (ref 135–145)
Total Bilirubin: 1.5 mg/dL — ABNORMAL HIGH (ref 0.3–1.2)
Total Protein: 7 g/dL (ref 6.5–8.1)

## 2021-03-19 LAB — LIPASE, BLOOD: Lipase: 27 U/L (ref 11–51)

## 2021-03-19 NOTE — ED Triage Notes (Signed)
Pt reports being constipated for over a week, no relief with dulcolax. Reports passing gas today. No acute distress is noted at triage.

## 2021-03-19 NOTE — ED Notes (Signed)
Pt up to desk stating that he will be leaving now. Pt seen leaving through the front doors.

## 2021-03-19 NOTE — ED Provider Notes (Signed)
Emergency Medicine Provider Triage Evaluation Note  Cody Preston , a 55 y.o. male  was evaluated in triage.  Pt complains of constipation. States last bm was 1.5 weeks ago.  Review of Systems  Positive: constipation Negative: nv  Physical Exam  BP 116/78 (BP Location: Left Arm)   Pulse 84   Temp 99 F (37.2 C) (Oral)   Resp 16   SpO2 100%  Gen:   Awake, no distress   Resp:  Normal effort  MSK:   Moves extremities without difficulty  Other:  abd soft and nontender  Medical Decision Making  Medically screening exam initiated at 2:24 PM.  Appropriate orders placed.  Loel Dubonnet was informed that the remainder of the evaluation will be completed by another provider, this initial triage assessment does not replace that evaluation, and the importance of remaining in the ED until their evaluation is complete.     Bishop Dublin 03/19/21 1425    Carmin Muskrat, MD 03/19/21 [phone]

## 2021-03-26 ENCOUNTER — Ambulatory Visit: Payer: Medicare Other | Admitting: Cardiology

## 2021-03-26 ENCOUNTER — Encounter: Payer: Self-pay | Admitting: Cardiology

## 2021-03-26 ENCOUNTER — Other Ambulatory Visit: Payer: Self-pay

## 2021-03-26 VITALS — BP 110/68 | HR 81 | Temp 98.6°F | Resp 16 | Ht 67.0 in | Wt 144.4 lb

## 2021-03-26 DIAGNOSIS — E7800 Pure hypercholesterolemia, unspecified: Secondary | ICD-10-CM

## 2021-03-26 DIAGNOSIS — I25118 Atherosclerotic heart disease of native coronary artery with other forms of angina pectoris: Secondary | ICD-10-CM | POA: Diagnosis not present

## 2021-03-26 DIAGNOSIS — I10 Essential (primary) hypertension: Secondary | ICD-10-CM | POA: Diagnosis not present

## 2021-03-26 NOTE — Progress Notes (Signed)
Primary Physician/Referring:  Iona Beard, MD  Patient ID: Cody Preston, male    DOB: 04-Jun-1966, 55 y.o.   MRN: [id number]  Chief Complaint  Patient presents with  . Shortness of Breath  . Chest Pain  . Follow-up    2 month   HPI:    Cody Preston  is a 55 y.o. 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   He has CAD and h/o Presented with ACS and NSTEMI on 04/19/2020, underwent urgent coronary angiogram and balloon angioplasty to occluded large circumflex, repeat angiography on 07/15/2020 due to recurrence of angina with successful orbital atherectomy of the proximal circumflex followed by stenting of the proximal circumflex, staged PCI with orbital atherectomy followed by stent to the proximal LAD on 12/18/2019.   This is 53-monthoffice visit, on his last office visit I had recommended that he discontinue Brilinta.  States that he is presently doing well and episodes of chest pain are brief and does not resemble anginal pain and he has not used any sublingual nitroglycerin.  Mild chronic dyspnea stable.  No PND orthopnea.  He has now started doing all his activities of daily living without any limitations.   Past Medical History:  Diagnosis Date  . Back pain   . Coronary artery disease   . Hypercholesteremia   . Hyperlipidemia    Past Surgical History:  Procedure Laterality Date  . [place] 05/[place]   Procedure: REMOVAL OF STENT RIGHT ULNAR ARTERY AND PRIMARY REPAIR OF ULNAR ARTERY;  Surgeon: DAngelia Mould MD;  [facility]  Service: Vascular;  Laterality: Right;  . BACK SURGERY    . BACK SURGERY    . CLUB FOOT RELEASE    .  CORONARY ATHERECTOMY N/A 12/18/2019   Procedure: CORONARY ATHERECTOMY;  Surgeon: PNigel Mormon MD;  [facility];  Service: Cardiovascular;  Laterality: N/A;  . CORONARY BALLOON ANGIOPLASTY N/A 05/20/2020   Procedure: CORONARY BALLOON ANGIOPLASTY;  Surgeon: GAdrian Prows MD;  [facility];  Service: Cardiovascular;  Laterality: N/A;  . CORONARY STENT INTERVENTION  12/18/2019  . CORONARY STENT INTERVENTION N/A 12/18/2019   Procedure: CORONARY STENT INTERVENTION;  Surgeon: PNigel Mormon MD;  [facility];  Service: Cardiovascular;  Laterality: N/A;  . CORONARY STENT INTERVENTION N/A 07/15/2020   Procedure: CORONARY STENT INTERVENTION;  Surgeon: GAdrian Prows MD;  [facility];  Service: Cardiovascular;  Laterality: N/A;  . CORONARY THROMBECTOMY N/A 04/19/2020   Procedure: Coronary Thrombectomy;  Surgeon: GAdrian Prows MD;  [facility];  Service: Cardiovascular;  Laterality: N/A;  . CORONARY/GRAFT ACUTE MI REVASCULARIZATION N/A 04/19/2020   Procedure: Coronary/Graft Acute MI Revascularization;  Surgeon: GAdrian Prows MD;  [facility];  Service: Cardiovascular;  Laterality: N/A;  . INTRAVASCULAR ULTRASOUND/IVUS N/A 12/18/2019   Procedure: Intravascular Ultrasound/IVUS;  Surgeon: PNigel Mormon MD;  [facility];  Service: Cardiovascular;  Laterality: N/A;  . LEFT HEART CATH AND CORONARY ANGIOGRAPHY N/A 12/18/2019   Procedure: LEFT HEART CATH AND CORONARY ANGIOGRAPHY;  Surgeon: PNigel Mormon MD;  [facility];  Service: Cardiovascular;  Laterality: N/A;  . LEFT HEART CATH AND CORONARY ANGIOGRAPHY N/A 04/19/2020   Procedure: LEFT HEART CATH AND CORONARY ANGIOGRAPHY;  Surgeon: [name], MD;  Location: [facility];  Service: Cardiovascular;  Laterality: N/A;  . SHUNT REPLACEMENT     at 80 months of age  . STENT REMOVAL Right 05/20/2020   REMOVAL  CORONARY STENT FROM RIGHT ULNAR ARTER   Social History   Tobacco Use  . Smoking status: Never Smoker  . Smokeless tobacco: Never Used  Substance Use Topics  . Alcohol use: Not Currently   ROS  Review of Systems  Constitutional: Negative for malaise/fatigue.  Cardiovascular: Positive for dyspnea on exertion. Negative for chest pain, leg swelling, near-syncope and palpitations.  Skin:       Positive for bruising at the right proximal thigh/groin   Musculoskeletal: Positive for arthritis and back pain.  Neurological: Negative for dizziness and light-headedness.   Objective  Blood pressure 110/68, pulse 81, temperature 98.6 F (37 C), temperature source Temporal, resp. rate 16, height 5' 7"  (1.702 m), weight 144 lb 6.4 oz (65.5 kg), SpO2 98 %.  Vitals with BMI 03/26/2021 03/19/2021 01/22/2021  Height 5' 7"  - 5' 7"   Weight 144 lbs 6 oz - 150 lbs  BMI 16.10 - 96.04  Systolic 540 981 191  Diastolic 68 78 75  Pulse 81 84 84     Physical Exam Neck:     Thyroid: No thyromegaly.  Cardiovascular:     Rate and Rhythm: Normal rate and regular rhythm.     Pulses: Intact distal pulses.          Radial pulses are 2+ on the right side and 2+ on the left side.       Posterior tibial pulses are 2+ on the right side and 2+ on the left side.     Heart sounds: Normal heart sounds. No murmur heard. No gallop.      Comments: No leg edema, no JVD.  Pulmonary:     Effort: Pulmonary effort is normal.     Breath sounds: Normal breath sounds.  Abdominal:     General: Bowel sounds are normal.     Palpations: Abdomen is soft.  Musculoskeletal:     Cervical back: Neck supple.  Skin:    Comments: Resolving bruise at the right proximal thigh/groin     Laboratory examination:   Recent Labs    05/21/20 0427 07/11/20 1424 07/16/20 2054 03/19/21 1426  NA 133* 132* 131* 128*  K 3.6 4.5 4.2 4.0  CL 99 97 99 93*  CO2 24 23 25 24   GLUCOSE 160* 102* 129* 121*  BUN 13 14 14 15   CREATININE 1.02  0.73* 0.85 1.03  CALCIUM 9.0 9.2 8.9 8.8*  GFRNONAA >60 106 >60 >60  GFRAA >60 122 >60  --    estimated creatinine clearance is 76.8 mL/min (by C-G formula based on SCr of 1.03 mg/dL).  CMP Latest Ref Rng & Units 03/19/2021 07/16/2020 07/11/2020  Glucose 70 - 99 mg/dL 121(H) 129(H) 102(H)  BUN 6 - 20 mg/dL 15 14 14   Creatinine 0.61 - 1.24 mg/dL 1.03 0.85 0.73(L)  Sodium 135 - 145 mmol/L 128(L) 131(L) 132(L)  Potassium 3.5 - 5.1 mmol/L 4.0 4.2 4.5  Chloride 98 - 111 mmol/L 93(L) 99 97  CO2 22 - 32 mmol/L 24 25 23   Calcium 8.9 - 10.3 mg/dL 8.8(L) 8.9 9.2  Total Protein 6.5 - 8.1 g/dL 7.0 - -  Total  Bilirubin 0.3 - 1.2 mg/dL 1.5(H) - -  Alkaline Phos 38 - 126 U/L 64 - -  AST 15 - 41 U/L 18 - -  ALT 0 - 44 U/L 13 - -   CBC Latest Ref Rng & Units 03/19/2021 07/16/2020 07/11/2020  WBC 4.0 - 10.5 K/uL 4.8 7.4 6.0  Hemoglobin 13.0 - 17.0 g/dL 10.9(L) 9.4(L) 10.8(L)  Hematocrit 39.0 - 52.0 % 35.6(L) 29.4(L) 33.3(L)  Platelets 150 - 400 K/uL 601(H) 362 420   Lipid Panel Recent Labs    04/21/20 0320  CHOL 128  TRIG 69  LDLCALC 52  VLDL 14  HDL 62  CHOLHDL 2.1   HEMOGLOBIN A1C Lab Results  Component Value Date   HGBA1C 5.9 (H) 04/21/2020   MPG 122.63 04/21/2020   TSH No results for input(s): TSH in the last 8760 hours.  Outside labs: 04/17/2019   Labs 01/20/2021:  Total cholesterol 116, triglycerides 55, HDL 63, LDL 39.  Non-HDL cholesterol 53.  Serum glucose 82 mg, BUN 12, creatinine 0.74, EGFR 105 mill, potassium 4.1.  CMP normal.  Hb 10.6/HCT 34.2, platelets 410.  Microcytic indicis.  Cholesterol, total 162.000 m  HDL 71.000 mg 04/17/2019 LDL 77.000 mg 04/17/2019 Triglycerides 63.000 mg 04/17/2019  Serum glucose 90 mg, BUN 13, creatinine 0.86, EGFR >100 mL.  Potassium 4.4.  Sodium 133.  CMP otherwise normal.  Medications and allergies   Allergies  Allergen Reactions  . Bee Venom Swelling and Other (See Comments)    Finger became blue and he had to be rushed to a  hospital  . Wasp Venom Swelling and Other (See Comments)    Skin turns black and blue at site stung, also     Current Outpatient Medications on File Prior to Visit  Medication Sig Dispense Refill  . aspirin 81 MG chewable tablet Chew 1 tablet (81 mg total) by mouth daily. 30 tablet 2  . diclofenac Sodium (VOLTAREN) 1 % GEL Apply 1 g topically 4 (four) times daily as needed (pain).     . metoprolol succinate (TOPROL-XL) 50 MG 24 hr tablet TAKE 1 TABLET BY MOUTH DAILY. 90 tablet 1  . nitroGLYCERIN (NITROSTAT) 0.4 MG SL tablet DISSOLVE 1 TABLET UNDER THE TONGUE EVERY 5 MINUTES FOR UP TO 3 DOSES AS NEEDED FOR CHEST PAIN. IF NO RELIEF AFTER 3 DOSES, CALL 911 OR GO TO ER. 25 tablet 3  . pantoprazole (PROTONIX) 40 MG tablet Take 1 tablet (40 mg total) by mouth daily. 90 tablet 3  . rosuvastatin (CRESTOR) 20 MG tablet TAKE 1 TABLET BY MOUTH DAILY. 90 tablet 1  . traMADol (ULTRAM) 50 MG tablet Take 50 mg by mouth 3 (three) times daily as needed (for pain).      No current facility-administered medications on file prior to visit.    Radiology:  No results found.  Cardiac Studies:   Lexiscan Tetrofosmin Stress Test  11/19/2019: Abnormal ECG stress. Intravenous Lexiscan protocol. Resting EKG/ECG demonstrated normal sinus rhythm. Peak EKG/ECG revealed 1 mm horizontal ST depression of the inferolateral leads. During infusion the patient developed no chest pain, and exhibited arm pain, dizziness, jaw pain, & headache. Large extent perfusion defect consistent with severe ischemia in the mid to distal anterior and anterolateral wall in LAD distribution.  Gated SPECT imaging of the left ventricle demonstrating akinesis in the same region.  Stress LVEF moderately depressed at 37%.  No previous exam available for comparison. High risk study.   Coronary angiogram and intervention 04/20/2020: LV: Moderate to severely  elevated LVEDP of 25 mmHg. No pressure gradient across the aortic valve. LVEF 50% without  wall motion abnormality in the RAO projection. No significant MR. RCA: Large caliber vessel, widely patent. Mild disease. Left main: Mildly calcified but no significant disease. LAD: Large caliber vessel. Previously placed stent in the proximal LAD in February 2021 is widely patent (stenting 3.0 x 46 mm and a 3.0 x 18 mm resolute Onyx DES). D1 is occluded and that is chronic. Faint ipsilateral collaterals are evident. A moderate-sized D2 is widely patent. Circumflex: Occluded in the proximal segment. Mild to moderate amount of calcification evident. SP thrombectomy with Pronto V4 catheter followed by balloon angioplasty with a 2.5 x 12 mm sapphire balloon. In spite of utilization of multiple guidewires including cougar XT, mailman, and also utilization of guide liner for support. Suspect significant amount of calcification, leading to difficulty in delivering the stents. A 3.5 x 22 mm resolute Onyx and a 3.5 x 18 mm Orsero stents were attempted to cross.  TIMI-3 flow was established with minimal residual stenosis of 5 to 10%.  Recommendation: Patient will be started on aspirin, indefinitely and Brilinta for at least a year. Could consider repeat coronary angiography in 4 to 6 weeks for evaluation for restenosis, and consider arthrectomy followed by stenting.  If remains stable, can be discharged in 24 to 48 hours. 155 mL contrast utilized. Extremely difficult procedure.  Echocardiogram 04/20/2020: 1. Left ventricular ejection fraction, by estimation, is 40 to 45%. The left ventricle has mildly decreased function. The left ventricle  demonstrates regional wall motion abnormalities (see scoring  diagram/findings for description). Left ventricular  diastolic parameters are indeterminate. There is moderate hypokinesis of  the left ventricular, basal-mid inferior wall.  2. Right ventricular systolic function is normal. The right ventricular  size is normal. There is mildly elevated  pulmonary artery systolic  pressure.  3. The mitral valve is grossly normal. Moderate to severe mitral valve  regurgitation.  4. Tricuspid valve regurgitation is moderate. RA-RV peak gradient 34  mmHg.  5. Compared to previous study on 11/20/2019, LVEF is lowered from 55-65%  to 40-45%, wall motion abnormalities are new, valvular regurgitation more  prominent.   Left Heart Catheterization 05/20/2020:  PTCA and angioplasty of the circumflex coronary artery and left main coronary artery, snaring of the previous stent that was undeployed in the left main and circumflex coronary artery.  Ulnar arteriotomy 05/20/2020: A transverse arteriotomy was made just above the stent after the proximal artery was clamped. I was able to reach down into the artery and retrieve the stent with a small hemostat.   Coronary Angioplasty 07/15/20: Planned  PCI to the circumflex coronary artery.  Successful orbital atherectomy of the proximal circumflex followed by stenting with 3.5 x 22 mm resolute Onyx DES of the proximal circumflex, stenosis reduced from 95% to 0% with TIMI-3 to TIMI-3 flow. 110 mL contrast utilized.  Recommendation: Patient to be discharged home today with outpatient follow-up.  He will be continued on dual antiplatelet therapy.  EKG:    EKG 01/22/2021: Normal sinus rhythm at rate of 72 bpm, left atrial enlargement, normal axis.  Incomplete right bundle branch block.  No evidence of ischemia.  No significant change from 07/23/2020.     Assessment     ICD-10-CM   1. Coronary artery disease of native artery of native heart with stable angina pectoris ([place])  I25.118   2. Hypercholesteremia  E78.00   3. Primary hypertension  I10  No orders of the defined types were placed in this encounter.   Medications Discontinued During This Encounter  Medication Reason  . ticagrelor (BRILINTA) 90 MG TABS tablet Error    Recommendations:   Cody Preston  is a 55 y.o. 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   He has CAD and h/o Presented with ACS and NSTEMI on 04/19/2020, underwent urgent coronary angiogram and balloon angioplasty to occluded large circumflex, repeat angiography on 07/15/2020 due to recurrence of angina with successful orbital atherectomy of the proximal circumflex followed by stenting of the proximal circumflex, staged PCI with orbital atherectomy followed by stent to the proximal LAD on 2/23 Dr. Carol Ada for evaluation of this.  2021.  I seen him 2 months ago, he is now off of Brilinta and is presently only on aspirin.  He still continues to have symptoms of GERD, he has not kept up the appointment with Dr. Collene Mares with GI.  We will make a referral again/office will call their office.  With regard to hypertension, hyperlipidemia, risk factors are very well controlled.  Since last office visit he has not had any further significant chest pain and has not used any sublingual nitroglycerin.  From cardiac standpoint he remains stable, I will see him back in 6 months.   [name], MD, [facility] 03/26/2021, 1:20 PM Office: [phone]   Addendum: Labs reviewed, lipids under excellent control, renal function is normal.  Chronic anemia persist.  Microcytic indicis.  Hopefully discontinuing Brilinta will also help.

## 2021-05-12 DIAGNOSIS — M545 Low back pain, unspecified: Secondary | ICD-10-CM | POA: Diagnosis not present

## 2021-05-12 DIAGNOSIS — E785 Hyperlipidemia, unspecified: Secondary | ICD-10-CM | POA: Diagnosis not present

## 2021-05-12 DIAGNOSIS — I25118 Atherosclerotic heart disease of native coronary artery with other forms of angina pectoris: Secondary | ICD-10-CM | POA: Diagnosis not present

## 2021-05-12 DIAGNOSIS — I25110 Atherosclerotic heart disease of native coronary artery with unstable angina pectoris: Secondary | ICD-10-CM | POA: Diagnosis not present

## 2021-05-12 DIAGNOSIS — K219 Gastro-esophageal reflux disease without esophagitis: Secondary | ICD-10-CM | POA: Diagnosis not present

## 2021-05-21 ENCOUNTER — Other Ambulatory Visit: Payer: Self-pay | Admitting: Cardiology

## 2021-05-21 ENCOUNTER — Telehealth: Payer: Self-pay | Admitting: Cardiology

## 2021-05-21 NOTE — Telephone Encounter (Signed)
done

## 2021-05-21 NOTE — Telephone Encounter (Signed)
7/28 PT says he needs medication refill, says the status of prescription was "under review" at the pharmacy. [facility]

## 2021-09-08 DIAGNOSIS — I25118 Atherosclerotic heart disease of native coronary artery with other forms of angina pectoris: Secondary | ICD-10-CM | POA: Diagnosis not present

## 2021-09-08 DIAGNOSIS — M545 Low back pain, unspecified: Secondary | ICD-10-CM | POA: Diagnosis not present

## 2021-09-08 DIAGNOSIS — E785 Hyperlipidemia, unspecified: Secondary | ICD-10-CM | POA: Diagnosis not present

## 2021-09-08 DIAGNOSIS — Z23 Encounter for immunization: Secondary | ICD-10-CM | POA: Diagnosis not present

## 2021-09-25 ENCOUNTER — Other Ambulatory Visit: Payer: Self-pay

## 2021-09-25 ENCOUNTER — Encounter: Payer: Self-pay | Admitting: Cardiology

## 2021-09-25 ENCOUNTER — Ambulatory Visit: Payer: Medicare Other | Admitting: Cardiology

## 2021-09-25 VITALS — BP 130/79 | HR 80 | Temp 98.6°F | Resp 16 | Ht 67.0 in | Wt 141.6 lb

## 2021-09-25 DIAGNOSIS — I10 Essential (primary) hypertension: Secondary | ICD-10-CM | POA: Diagnosis not present

## 2021-09-25 DIAGNOSIS — I25118 Atherosclerotic heart disease of native coronary artery with other forms of angina pectoris: Secondary | ICD-10-CM | POA: Diagnosis not present

## 2021-09-25 DIAGNOSIS — E7800 Pure hypercholesterolemia, unspecified: Secondary | ICD-10-CM

## 2021-09-25 NOTE — Progress Notes (Signed)
Primary Physician/Referring:  Iona Beard, MD  Patient ID: Cody Preston, male    DOB: 1966-03-20, 56 y.o.   MRN: [id number]  Chief Complaint  Patient presents with   Coronary Artery Disease   Hyperlipidemia   Follow-up    6 months   HPI:    Cody Preston  is a 55 y.o. 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   LAD angioplasty on 12/19/2019 and  NSTEMI on 04/20/2020 for which he underwent balloon angioplasty only with stent loss in the ostial left main which was extracted on 05/20/2020 underwent orbital atherectomy followed by stenting of the circumflex coronary artery in Sept 2021.  Is presenting well and has not had any recurrence of angina pectoris.  No change in his symptoms, has resumed all his physical activity.  He still continues to have frequent falls from orthopedic issues.   Past Medical History:  Diagnosis Date   Back pain    Coronary artery disease    Hypercholesteremia    Hyperlipidemia    Past Surgical History:  Procedure Laterality Date   AVGG REMOVAL Right 05/20/2020   Procedure: REMOVAL OF STENT RIGHT ULNAR ARTERY AND PRIMARY REPAIR OF ULNAR ARTERY;  Surgeon: Angelia Mould, MD;  Location: [facility] OR;  Service: Vascular;  Laterality: Right;   BACK SURGERY     BACK SURGERY     CLUB FOOT RELEASE     CORONARY ATHERECTOMY N/A 12/18/2019   Procedure: CORONARY ATHERECTOMY;  Surgeon: Nigel Mormon, MD;  Location: [place] [facility];  Service: Cardiovascular;  Laterality: N/A;   CORONARY BALLOON ANGIOPLASTY N/A 05/20/2020   Procedure: CORONARY BALLOON ANGIOPLASTY;  Surgeon: [name], MD;  Location: [place] [facility];  Service:  Cardiovascular;  Laterality: N/A;   CORONARY STENT INTERVENTION  12/18/2019   CORONARY STENT INTERVENTION N/A 12/18/2019   Procedure: CORONARY STENT INTERVENTION;  Surgeon: Nigel Mormon, MD;  Location: [place] [facility];  Service: Cardiovascular;  Laterality: N/A;   CORONARY STENT INTERVENTION N/A 07/15/2020   Procedure: CORONARY STENT INTERVENTION;  Surgeon: [name], MD;  Location: [place] [facility];  Service: Cardiovascular;  Laterality: N/A;   CORONARY THROMBECTOMY N/A 04/19/2020   Procedure: Coronary Thrombectomy;  Surgeon: [name], MD;  Location: [place] [facility];  Service: Cardiovascular;  Laterality: N/A;   CORONARY/GRAFT ACUTE MI REVASCULARIZATION N/A 04/19/2020   Procedure: Coronary/Graft Acute MI Revascularization;  Surgeon: [name], MD;  Location: [place] [facility];  Service: Cardiovascular;  Laterality: N/A;   INTRAVASCULAR ULTRASOUND/IVUS N/A 12/18/2019   Procedure: Intravascular Ultrasound/IVUS;  Surgeon: Nigel Mormon, MD;  Location: [place] [facility];  Service: Cardiovascular;  Laterality: N/A;   LEFT HEART CATH AND CORONARY ANGIOGRAPHY N/A 12/18/2019   Procedure: LEFT HEART CATH AND CORONARY ANGIOGRAPHY;  Surgeon: Nigel Mormon, MD;  Location: [place] [facility];  Service: Cardiovascular;  Laterality: N/A;   LEFT HEART CATH AND CORONARY ANGIOGRAPHY N/A 04/19/2020   Procedure: LEFT HEART CATH AND CORONARY ANGIOGRAPHY;  Surgeon: [name], MD;  Location: [place];  Service: Cardiovascular;  Laterality: N/A;   SHUNT REPLACEMENT     at 61 months of age  STENT REMOVAL Right 05/20/2020   REMOVAL CORONARY STENT FROM RIGHT ULNAR ARTER   Social History   Tobacco Use   Smoking status: Never   Smokeless tobacco: Never  Substance Use Topics   Alcohol use: Yes    Alcohol/week: 1.0 standard drink    Types: 1 Shots of liquor per week   ROS  Review of Systems  Constitutional: Negative for malaise/fatigue.  Cardiovascular:  Positive for  dyspnea on exertion. Negative for chest pain, leg swelling, near-syncope and palpitations.  Skin:           Musculoskeletal:  Positive for arthritis and back pain.  Neurological:  Negative for dizziness and light-headedness.  Objective  Blood pressure 130/79, pulse 80, temperature 98.6 F (37 C), temperature source Temporal, resp. rate 16, height _0  (1.702 m), weight 141 lb 9.6 oz (64.2 kg), SpO2 99 %.  Vitals with BMI 09/25/2021 03/26/2021 03/19/2021  Height _1  _2  -  Weight 141 lbs 10 oz 144 lbs 6 oz -  BMI 03.54 65.68 -  Systolic 127 517 001  Diastolic 79 68 78  Pulse 80 81 84     Physical Exam Neck:     Thyroid: No thyromegaly.  Cardiovascular:     Rate and Rhythm: Normal rate and regular rhythm.     Pulses: Intact distal pulses.          Radial pulses are 2+ on the right side and 2+ on the left side.       Posterior tibial pulses are 2+ on the right side and 2+ on the left side.     Heart sounds: Normal heart sounds. No murmur heard.   No gallop.     Comments: No leg edema, no JVD.  Pulmonary:     Effort: Pulmonary effort is normal.     Breath sounds: Normal breath sounds.  Abdominal:     General: Bowel sounds are normal.     Palpations: Abdomen is soft.  Musculoskeletal:     Cervical back: Neck supple.  Skin:    Comments: Resolving bruise at the right proximal thigh/groin    Laboratory examination:   Recent Labs    03/19/21 1426  NA 128*  K 4.0  CL 93*  CO2 24  GLUCOSE 121*  BUN 15  CREATININE 1.03  CALCIUM 8.8*  GFRNONAA >60   CrCl cannot be calculated (Patient's most recent lab result is older than the maximum 21 days allowed.).  CMP Latest Ref Rng & Units 03/19/2021 07/16/2020 07/11/2020  Glucose 70 - 99 mg/dL 121(H) 129(H) 102(H)  BUN 6 - 20 mg/dL _3 Creatinine 0.61 - 1.24 mg/dL 1.03 0.85 0.73(L)  Sodium 135 - 145 mmol/L 128(L) 131(L) 132(L)  Potassium 3.5 - 5.1 mmol/L 4.0 4.2 4.5  Chloride 98 - 111 mmol/L 93(L) 99 97  CO2 22 - 32  mmol/L _4 Calcium 8.9 - 10.3 mg/dL 8.8(L) 8.9 9.2  Total Protein 6.5 - 8.1 g/dL 7.0 - -  Total Bilirubin 0.3 - 1.2 mg/dL 1.5(H) - -  Alkaline Phos 38 - 126 U/L 64 - -  AST 15 - 41 U/L 18 - -  ALT 0 - 44 U/L 13 - -   CBC Latest Ref Rng & Units 03/19/2021 07/16/2020 07/11/2020  WBC 4.0 - 10.5 K/uL 4.8 7.4 6.0  Hemoglobin 13.0 - 17.0 g/dL 10.9(L) 9.4(L) 10.8(L)  Hematocrit 39.0 - 52.0 % 35.6(L) 29.4(L) 33.3(L)  Platelets 150 - 400 K/uL 601(H) 362 420  Lipid Panel No results for input(s): CHOL, TRIG, LDLCALC, VLDL, HDL, CHOLHDL, LDLDIRECT in the last 8760 hours.  HEMOGLOBIN A1C Lab Results  Component Value Date   HGBA1C 5.9 (H) 04/21/2020   MPG 122.63 04/21/2020   TSH No results for input(s): TSH in the last 8760 hours.  Outside labs: 04/17/2019   Labs 01/20/2021:  Total cholesterol 116, triglycerides 55, HDL 63, LDL 39.  Non-HDL cholesterol 53.  Serum glucose 82 mg, BUN 12, creatinine 0.74, EGFR 105 mill, potassium 4.1.  CMP normal.  Hb 10.6/HCT 34.2, platelets 410.  Microcytic indicis.  Cholesterol, total 162.000 m  HDL 71.000 mg 04/17/2019 LDL 77.000 mg 04/17/2019 Triglycerides 63.000 mg 04/17/2019  Serum glucose 90 mg, BUN 13, creatinine 0.86, EGFR >100 mL.  Potassium 4.4.  Sodium 133.  CMP otherwise normal.  Medications and allergies   Allergies  Allergen Reactions   Bee Venom Swelling and Other (See Comments)    Finger became blue and he had to be rushed to a hospital   Wasp Venom Swelling and Other (See Comments)    Skin turns black and blue at site stung, also     Current Outpatient Medications on File Prior to Visit  Medication Sig Dispense Refill   acetaminophen (TYLENOL) 325 MG tablet Take 650 mg by mouth every 6 (six) hours as needed.     aspirin 81 MG chewable tablet Chew 1 tablet (81 mg total) by mouth daily. 30 tablet 2   diclofenac Sodium (VOLTAREN) 1 % GEL Apply 1 g topically 4 (four) times daily as needed (pain).      esomeprazole (NEXIUM) 20  MG capsule Take 20 mg by mouth daily at 12 noon.     metoprolol succinate (TOPROL-XL) 50 MG 24 hr tablet TAKE 1 TABLET BY MOUTH DAILY. 90 tablet 1   nitroGLYCERIN (NITROSTAT) 0.4 MG SL tablet DISSOLVE 1 TABLET UNDER THE TONGUE EVERY 5 MINUTES FOR UP TO 3 DOSES AS NEEDED FOR CHEST PAIN. IF NO RELIEF AFTER 3 DOSES, CALL 911 OR GO TO ER. 25 tablet 3   rosuvastatin (CRESTOR) 20 MG tablet TAKE 1 TABLET BY MOUTH DAILY. 90 tablet 1   traMADol (ULTRAM) 50 MG tablet Take 50 mg by mouth 3 (three) times daily as needed (for pain).      No current facility-administered medications on file prior to visit.    Radiology:  No results found.  Cardiac Studies:   Lexiscan Tetrofosmin Stress Test  11/19/2019: Abnormal ECG stress. Intravenous Lexiscan protocol. Resting EKG/ECG demonstrated normal sinus rhythm. Peak EKG/ECG revealed 1 mm horizontal ST depression of the inferolateral leads. During infusion the patient developed no chest pain, and exhibited arm pain, dizziness, jaw pain, & headache. Large extent perfusion defect consistent with severe ischemia in the mid to distal anterior and anterolateral wall in LAD distribution.  Gated SPECT imaging of the left ventricle demonstrating akinesis in the same region.  Stress LVEF moderately depressed at 37%.  No previous exam available for comparison. High risk study.   Coronary angiogram and intervention 04/20/2020: LV: Moderate to severely elevated LVEDP of 25 mmHg.  No pressure gradient across the aortic valve.  LVEF 50% without wall motion abnormality in the RAO projection.  No significant MR. RCA: Large caliber vessel, widely patent.  Mild disease. Left main: Mildly calcified but no significant disease. LAD: Large caliber vessel.  Previously placed stent in the proximal LAD in February 2021 is widely patent (stenting 3.0 x 46 mm and a 3.0 x 18 mm resolute Onyx  DES).  D1 is occluded and that is chronic.  Faint ipsilateral collaterals are evident.  A  moderate-sized D2 is widely patent. Circumflex: Occluded in the proximal segment.  Mild to moderate amount of calcification evident.  SP thrombectomy with Pronto V4 catheter followed by balloon angioplasty with a 2.5 x 12 mm sapphire balloon.  In spite of utilization of multiple guidewires including cougar XT, mailman, and also utilization of guide liner for support.  Suspect significant amount of calcification, leading to difficulty in delivering the stents.  A 3.5 x 22 mm resolute Onyx and a 3.5 x 18 mm Orsero stents were attempted to cross.   TIMI-3 flow was established with minimal residual stenosis of 5 to 10%.   Recommendation: Patient will be started on aspirin, indefinitely and Brilinta for at least a year. Could consider repeat coronary angiography in 4 to 6 weeks for evaluation for restenosis, and consider arthrectomy followed by stenting.   If remains stable, can be discharged in 24 to 48 hours.  155 mL contrast utilized.  Extremely difficult procedure.   Echocardiogram 04/20/2020:  1. Left ventricular ejection fraction, by estimation, is 40 to 45%. The left ventricle has mildly decreased function. The left ventricle  demonstrates regional wall motion abnormalities (see scoring  diagram/findings for description). Left ventricular  diastolic parameters are indeterminate. There is moderate hypokinesis of  the left ventricular, basal-mid inferior wall.   2. Right ventricular systolic function is normal. The right ventricular  size is normal. There is mildly elevated pulmonary artery systolic  pressure.   3. The mitral valve is grossly normal. Moderate to severe mitral valve  regurgitation.   4. Tricuspid valve regurgitation is moderate. RA-RV peak gradient 34  mmHg.   5. Compared to previous study on 11/20/2019, LVEF is lowered from 55-65%  to 40-45%, wall motion abnormalities are new, valvular regurgitation more  prominent.   Left Heart Catheterization 05/20/2020:  PTCA and angioplasty of  the circumflex coronary artery and left main coronary artery, snaring of the previous stent that was undeployed in the left main and circumflex coronary artery.   Ulnar arteriotomy 05/20/2020: A transverse arteriotomy was made just above the stent after the proximal artery was clamped.  I was able to reach down into the artery and retrieve the stent with a small hemostat.   Coronary Angioplasty 07/15/20: Planned  PCI to the circumflex coronary artery.  Successful orbital atherectomy of the proximal circumflex followed by stenting with 3.5 x 22 mm resolute Onyx DES of the proximal circumflex, stenosis reduced from 95% to 0% with TIMI-3 to TIMI-3 flow. 110 mL contrast utilized.   Recommendation: Patient to be discharged home today with outpatient follow-up.  He will be continued on dual antiplatelet therapy.  EKG:   EKG 09/25/2021: Normal sinus rhythm with rate of 80 bpm, leftward axis, incomplete right bundle branch block.  No evidence of ischemia, otherwise normal EKG.  No significant change from 01/22/2021.   Assessment     ICD-10-CM   1. Coronary artery disease of native artery of native heart with stable angina pectoris ([place])  I25.118     2. Primary hypertension  I10 EKG 12-Lead    3. Hypercholesteremia  E78.00       No orders of the defined types were placed in this encounter.   Medications Discontinued During This Encounter  Medication Reason   pantoprazole (PROTONIX) 40 MG tablet     Recommendations:   Cody Preston  is a 55 y.o. 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   LAD angioplasty on 12/19/2019 and  NSTEMI on 04/20/2020 for which  he underwent balloon angioplasty only with stent loss in the ostial left main which was extracted on 05/20/2020 underwent orbital atherectomy followed by stenting of the circumflex coronary artery in Sept 2021.  Presently remains asymptomatic, no change in his physical exam, no change in his EKG.  Advised him to continue present medications, lipids are under excellent control and blood pressure is also well controlled.  I will see him back on an annual basis.   [name], MD, [facility] 09/25/2021, 2:01 PM Office: [phone]

## 2021-10-29 ENCOUNTER — Other Ambulatory Visit: Payer: Self-pay | Admitting: Cardiology

## 2021-12-21 DIAGNOSIS — I25118 Atherosclerotic heart disease of native coronary artery with other forms of angina pectoris: Secondary | ICD-10-CM | POA: Diagnosis not present

## 2021-12-21 DIAGNOSIS — Z6822 Body mass index (BMI) 22.0-22.9, adult: Secondary | ICD-10-CM | POA: Diagnosis not present

## 2021-12-21 DIAGNOSIS — M545 Low back pain, unspecified: Secondary | ICD-10-CM | POA: Diagnosis not present

## 2021-12-21 DIAGNOSIS — E785 Hyperlipidemia, unspecified: Secondary | ICD-10-CM | POA: Diagnosis not present

## 2022-02-21 IMAGING — DX DG CHEST 2V
2 series · 2 of 2 positions shown · non-contrast
Comparison: Radiograph 03/07/2014, CT 08/30/2014

CLINICAL DATA: Chest pain

EXAM:
CHEST - 2 VIEW

[chest pa]
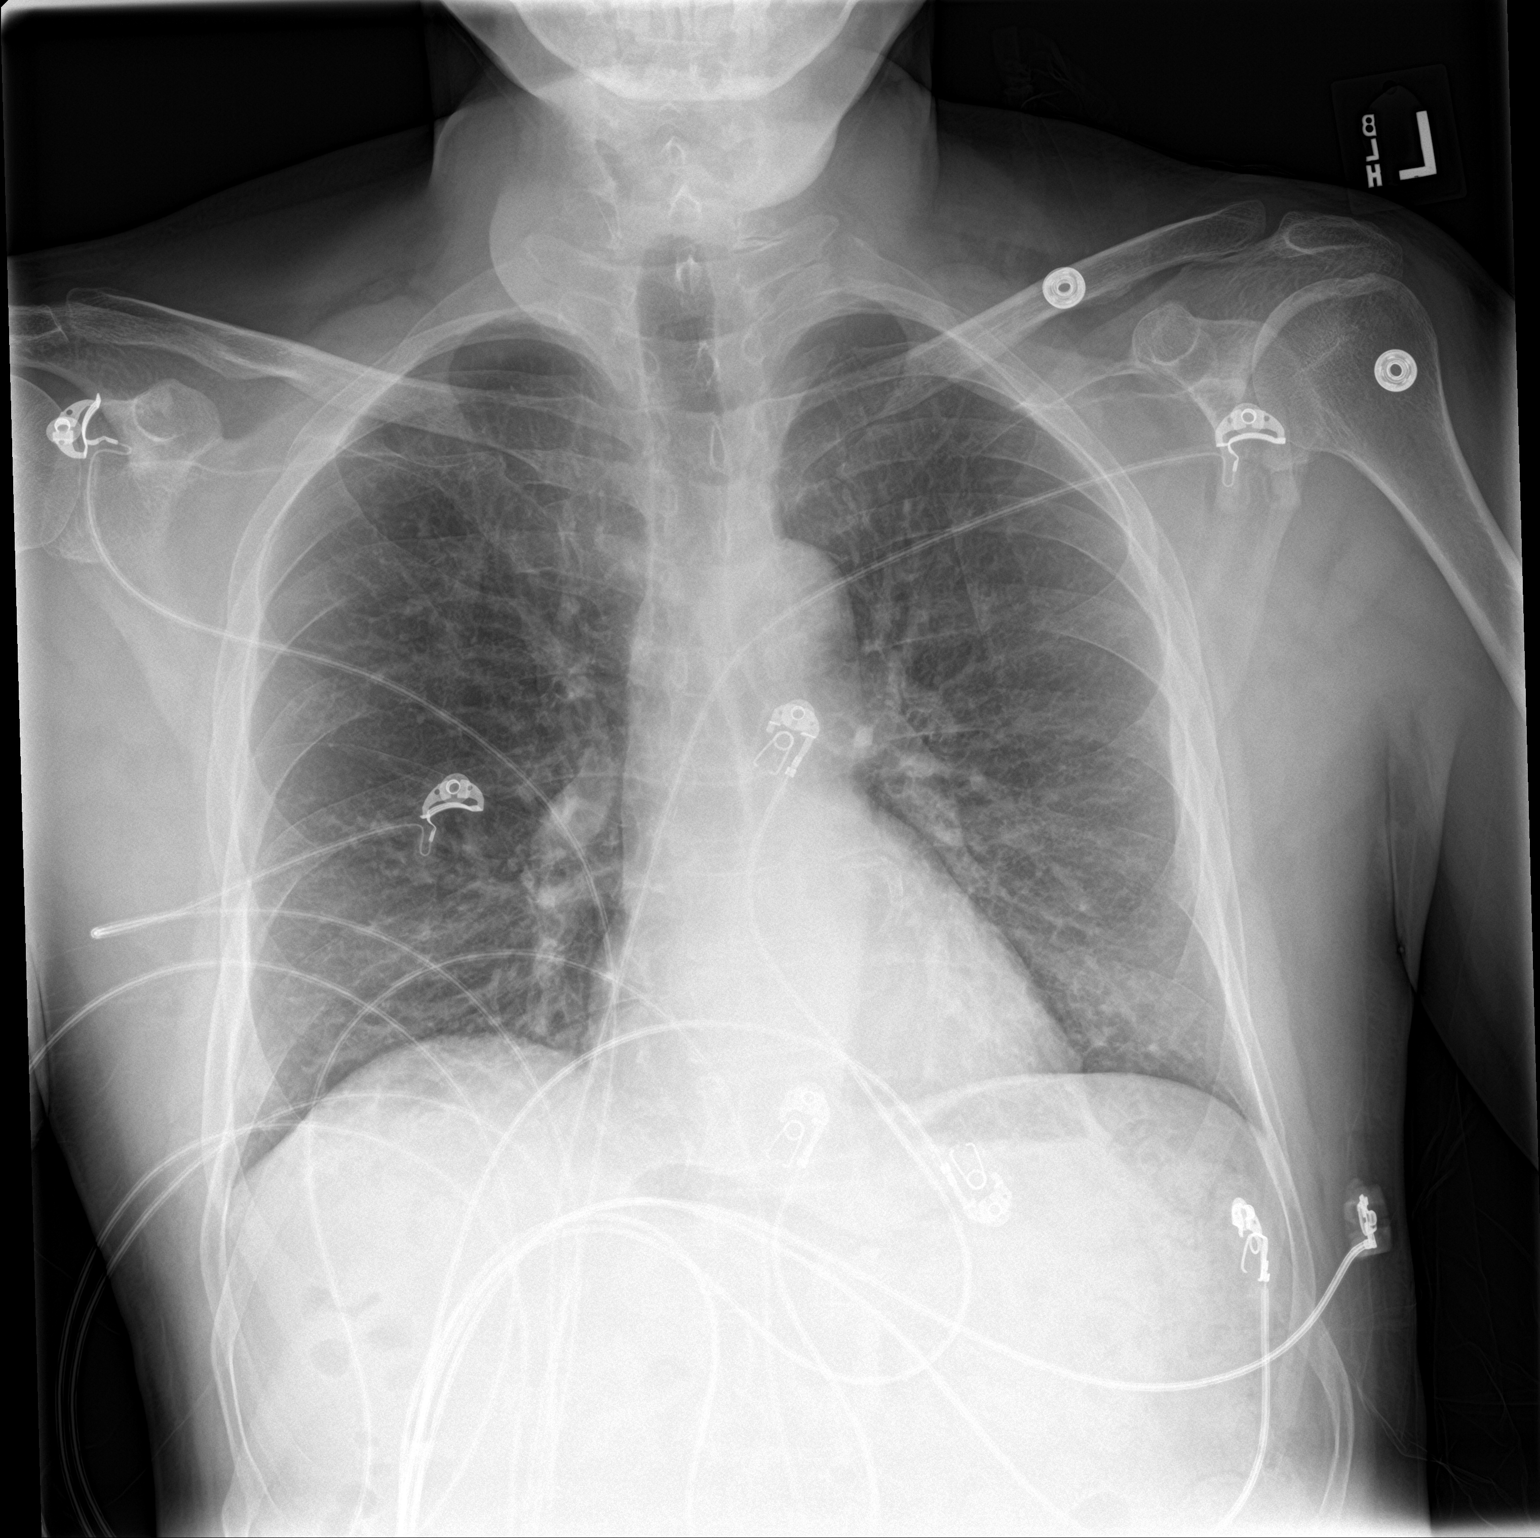

[chest lat]
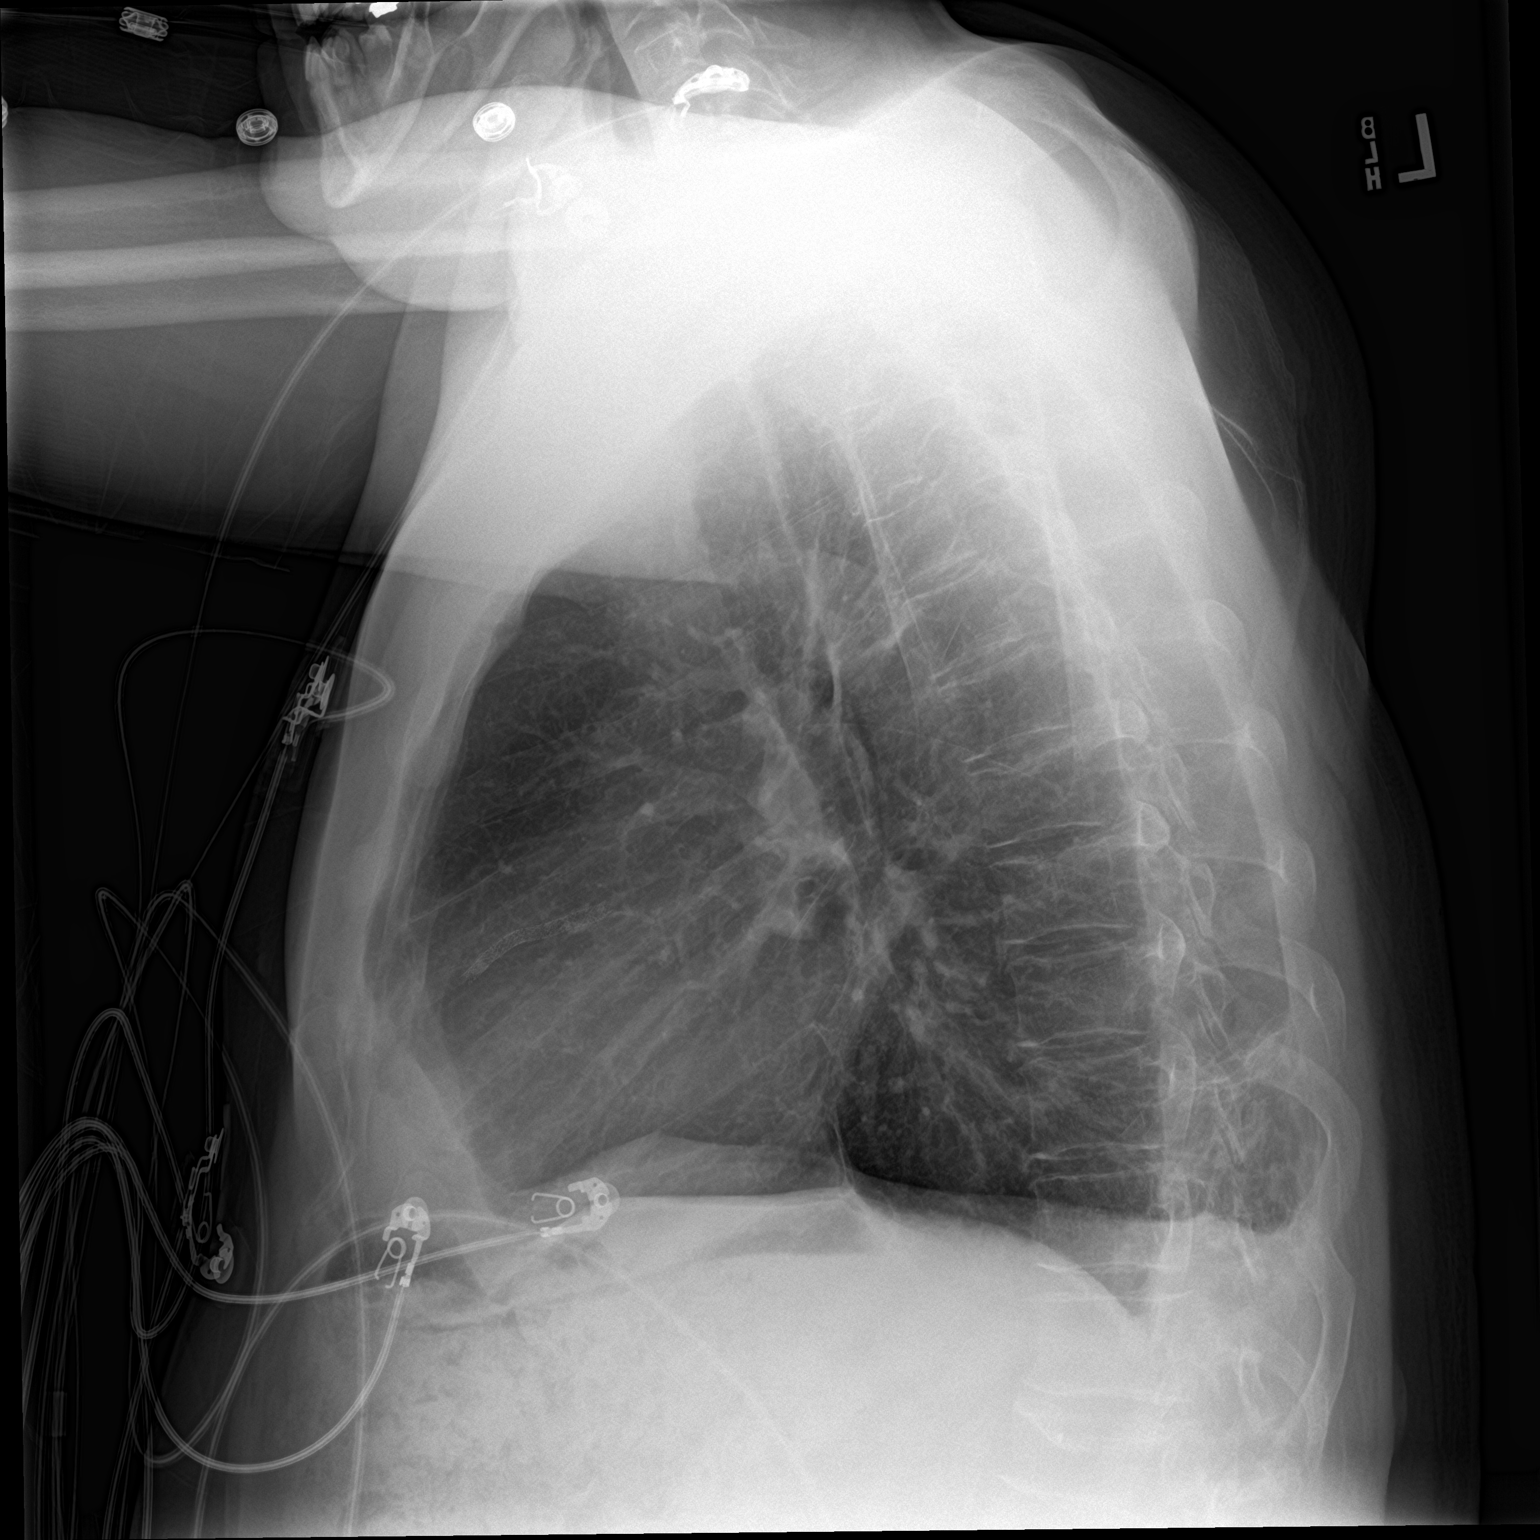

[2 of 2 positions shown; findings below may reference images not displayed]

FINDINGS: Chronically coarsened interstitial and bronchitic features are
similar to comparison exams. Mild hyperinflation and flattening of
the diaphragms. No consolidation, features of edema, pneumothorax,
or effusion. The cardiomediastinal contours are unremarkable. No
acute osseous or soft tissue abnormality. Telemetry leads overlie
the chest.
IMPRESSION: 1. No acute cardiopulmonary disease.
2. Chronically coarsened interstitial and bronchitic features with
hyperinflation.

## 2022-03-29 DIAGNOSIS — Z0000 Encounter for general adult medical examination without abnormal findings: Secondary | ICD-10-CM | POA: Diagnosis not present

## 2022-03-29 DIAGNOSIS — Z6821 Body mass index (BMI) 21.0-21.9, adult: Secondary | ICD-10-CM | POA: Diagnosis not present

## 2022-04-26 ENCOUNTER — Other Ambulatory Visit: Payer: Self-pay | Admitting: Cardiology

## 2022-06-02 DIAGNOSIS — D485 Neoplasm of uncertain behavior of skin: Secondary | ICD-10-CM | POA: Diagnosis not present

## 2022-06-10 ENCOUNTER — Encounter: Payer: Self-pay | Admitting: Cardiology

## 2022-07-05 DIAGNOSIS — M545 Low back pain, unspecified: Secondary | ICD-10-CM | POA: Diagnosis not present

## 2022-07-05 DIAGNOSIS — Z6821 Body mass index (BMI) 21.0-21.9, adult: Secondary | ICD-10-CM | POA: Diagnosis not present

## 2022-07-05 DIAGNOSIS — E785 Hyperlipidemia, unspecified: Secondary | ICD-10-CM | POA: Diagnosis not present

## 2022-07-05 DIAGNOSIS — I25118 Atherosclerotic heart disease of native coronary artery with other forms of angina pectoris: Secondary | ICD-10-CM | POA: Diagnosis not present

## 2022-07-05 DIAGNOSIS — I119 Hypertensive heart disease without heart failure: Secondary | ICD-10-CM | POA: Diagnosis not present

## 2022-07-05 DIAGNOSIS — I10 Essential (primary) hypertension: Secondary | ICD-10-CM | POA: Diagnosis not present

## 2022-07-07 ENCOUNTER — Telehealth: Payer: Self-pay

## 2022-07-07 NOTE — Patient Outreach (Signed)
  Care Coordination   07/07/2022 Name: Cody Preston MRN: [id number] DOB: [birth year]   Care Coordination Outreach Attempts:  An unsuccessful telephone outreach was attempted today to offer the patient information about available care coordination services as a benefit of their health plan.   Follow Up Plan:  Additional outreach attempts will be made to offer the patient care coordination information and services.   Encounter Outcome:  No Answer  Care Coordination Interventions Activated:  No   Care Coordination Interventions:  No, not indicated     Enzo Montgomery, RN,BSN,CCM [facility] Telephonic Care Management Coordinator Direct Phone: [phone] Toll Free: [phone] Fax: [phone]

## 2022-07-09 ENCOUNTER — Telehealth: Payer: Self-pay

## 2022-07-09 NOTE — Patient Outreach (Signed)
  Care Coordination   07/09/2022 Name: MAGNUS CRESCENZO MRN: [id number] DOB: 1966-09-26   Care Coordination Outreach Attempts:  A second unsuccessful outreach was attempted today to offer the patient with information about available care coordination services as a benefit of their health plan.     Follow Up Plan:  Additional outreach attempts will be made to offer the patient care coordination information and services.   Encounter Outcome:  No Answer  Care Coordination Interventions Activated:  No   Care Coordination Interventions:  No, not indicated      Enzo Montgomery, RN,BSN,CCM [facility] Telephonic Care Management Coordinator Direct Phone: [phone] Toll Free: [phone] Fax: [phone]

## 2022-07-13 ENCOUNTER — Telehealth: Payer: Self-pay

## 2022-07-13 ENCOUNTER — Other Ambulatory Visit: Payer: Self-pay | Admitting: Cardiology

## 2022-07-13 NOTE — Patient Outreach (Signed)
  Care Coordination   07/13/2022 Name: Cody Preston MRN: [id number] DOB: 17-Feb-1966   Care Coordination Outreach Attempts:  A third unsuccessful outreach was attempted today to offer the patient with information about available care coordination services as a benefit of their health plan.   Follow Up Plan:  No further outreach attempts will be made at this time. We have been unable to contact the patient to offer or enroll patient in care coordination services  Encounter Outcome:  No Answer  Care Coordination Interventions Activated:  No   Care Coordination Interventions:  No, not indicated    Enzo Montgomery, RN,BSN,CCM [facility] Telephonic Care Management Coordinator Direct Phone: [phone] Toll Free: [phone] Fax: [phone]

## 2022-07-21 DIAGNOSIS — L720 Epidermal cyst: Secondary | ICD-10-CM | POA: Diagnosis not present

## 2022-08-16 DIAGNOSIS — L720 Epidermal cyst: Secondary | ICD-10-CM | POA: Diagnosis not present

## 2022-09-20 DIAGNOSIS — L720 Epidermal cyst: Secondary | ICD-10-CM | POA: Diagnosis not present

## 2022-09-27 ENCOUNTER — Ambulatory Visit: Payer: Medicare Other | Admitting: Cardiology

## 2022-09-27 ENCOUNTER — Encounter: Payer: Self-pay | Admitting: Cardiology

## 2022-09-27 VITALS — BP 124/80 | HR 80 | Resp 16 | Ht 67.0 in | Wt 152.4 lb

## 2022-09-27 DIAGNOSIS — E7800 Pure hypercholesterolemia, unspecified: Secondary | ICD-10-CM

## 2022-09-27 DIAGNOSIS — I10 Essential (primary) hypertension: Secondary | ICD-10-CM | POA: Insufficient documentation

## 2022-09-27 DIAGNOSIS — I25118 Atherosclerotic heart disease of native coronary artery with other forms of angina pectoris: Secondary | ICD-10-CM

## 2022-09-27 HISTORY — DX: Essential (primary) hypertension: I10

## 2022-09-27 MED ORDER — LOSARTAN POTASSIUM 25 MG PO TABS: 25.0000 mg | ORAL_TABLET | Freq: Every evening | ORAL | 1 refills | Status: DC

## 2022-09-27 MED ORDER — NITROGLYCERIN 0.4 MG SL SUBL: 0.4000 mg | SUBLINGUAL_TABLET | SUBLINGUAL | 3 refills | Status: DC | PRN

## 2022-09-27 NOTE — Progress Notes (Signed)
Primary Physician/Referring:  Iona Beard, MD  Patient ID: Cody Preston, male    DOB: November 17, 1965, 56 y.o.   MRN: [id number]  Chief Complaint  Patient presents with   Coronary Artery Disease   Hypertension   Hyperlipidemia   Follow-up    1 year   HPI:    Cody Preston  is a 56 y.o. 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   LAD angioplasty on 12/19/2019 and  NSTEMI on 04/20/2020 for which he underwent balloon angioplasty only with stent loss in the ostial left main which was extracted on 05/20/2020 underwent orbital atherectomy followed by stenting of the circumflex coronary artery in Sept 2021.  He presents for 60-monthoffice visit, he has not developed angina pectoris and is taking occasional sublingual nitroglycerin with immediate relief.  He has not had any lifestyle limiting angina pectoris.  About 3 days ago he had syncope after he drank about 4-5 drinks.  He also has acute instability as a baseline as well.  Past Medical History:  Diagnosis Date   Back pain    Coronary artery disease    Hypercholesteremia    Hyperlipidemia    Primary hypertension 09/27/2022   Social History   Tobacco Use   Smoking status: Never   Smokeless tobacco: Never  Substance Use Topics   Alcohol use: Yes    Alcohol/week: 1.0 standard drink of alcohol    Types: 1 Shots of liquor per week    Comment: occasionally   ROS  Review of Systems  Cardiovascular:  Positive for chest pain. Negative for dyspnea on exertion and leg swelling.   Objective  Blood pressure 124/80, pulse 80, resp. rate 16, height _0  (1.702 m), weight 152 lb 6.4 oz (69.1 kg), SpO2 98  %.     09/27/2022    1:18 PM 09/27/2022    1:09 PM 09/25/2021    1:14 PM  Vitals with BMI  Height  _1  _2   Weight  152 lbs 6 oz 141 lbs 10 oz  BMI  222.29279.89 Systolic [id number] Diastolic 80 [id number]  Pulse 80 81 80     Physical Exam Neck:     Vascular: No carotid bruit or JVD.  Cardiovascular:     Rate and Rhythm: Normal rate and regular rhythm.     Pulses: Normal pulses and intact distal pulses.     Heart sounds: Normal heart sounds. No murmur heard.    No gallop.     Comments:   Pulmonary:     Effort: Pulmonary effort is normal.     Breath sounds: Normal breath sounds.  Abdominal:     General: Bowel sounds are normal.     Palpations: Abdomen is soft.  Musculoskeletal:     Right lower leg: No edema.     Left lower leg: No edema.  Skin:    Comments:      Laboratory examination:   Outside labs: 04/17/2019  Cholesterol, total 138.000 m 07/05/2022 HDL 72.000 mg 07/05/2022 LDL 51.000 mg 07/05/2022 Triglycerides 73.000 mg 07/05/2022  Labs 01/20/2021:  Total cholesterol 116, triglycerides 55, HDL 63, LDL 39.  Non-HDL  cholesterol 53.  Serum glucose 82 mg, BUN 12, creatinine 0.74, EGFR 105 mill, potassium 4.1.  CMP normal.  Hb 10.6/HCT 34.2, platelets 410.  Microcytic indicis.  Medications and allergies   Allergies  Allergen Reactions   Bee Venom Swelling and Other (See Comments)    Finger became blue and he had to be rushed to a hospital   Wasp Venom Swelling and Other (See Comments)    Skin turns black and blue at site stung, also     Current Outpatient Medications:    acetaminophen (TYLENOL) 325 MG tablet, Take 650 mg by mouth every 6 (six) hours as needed., Disp: , Rfl:    aspirin 81 MG chewable tablet, Chew 1 tablet (81 mg total) by mouth daily., Disp: 30 tablet, Rfl: 2   esomeprazole (NEXIUM) 20 MG capsule, Take 20 mg by mouth daily at 12 noon., Disp: , Rfl:    losartan (COZAAR) 25 MG tablet, Take 1 tablet (25 mg total) by mouth every evening.,  Disp: 90 tablet, Rfl: 1   metoprolol succinate (TOPROL-XL) 50 MG 24 hr tablet, TAKE 1 TABLET BY MOUTH DAILY., Disp: 90 tablet, Rfl: 1   rosuvastatin (CRESTOR) 20 MG tablet, TAKE 1 TABLET BY MOUTH DAILY., Disp: 90 tablet, Rfl: 3   traMADol (ULTRAM) 50 MG tablet, Take 50 mg by mouth 3 (three) times daily as needed (for pain). , Disp: , Rfl:    nitroGLYCERIN (NITROSTAT) 0.4 MG SL tablet, Place 1 tablet (0.4 mg total) under the tongue every 5 (five) minutes as needed for chest pain., Disp: 25 tablet, Rfl: 3  Radiology:  No results found.  Cardiac Studies:   Lexiscan Tetrofosmin Stress Test  11/19/2019: Abnormal ECG stress. Intravenous Lexiscan protocol. Resting EKG/ECG demonstrated normal sinus rhythm. Peak EKG/ECG revealed 1 mm horizontal ST depression of the inferolateral leads. During infusion the patient developed no chest pain, and exhibited arm pain, dizziness, jaw pain, & headache. Large extent perfusion defect consistent with severe ischemia in the mid to distal anterior and anterolateral wall in LAD distribution.  Gated SPECT imaging of the left ventricle demonstrating akinesis in the same region.  Stress LVEF moderately depressed at 37%.  No previous exam available for comparison. High risk study.   Echocardiogram 04/20/2020:  1. Left ventricular ejection fraction, by estimation, is 40 to 45%. The left ventricle has mildly decreased function. The left ventricle  demonstrates regional wall motion abnormalities (see scoring  diagram/findings for description). Left ventricular  diastolic parameters are indeterminate. There is moderate hypokinesis of  the left ventricular, basal-mid inferior wall.   2. Right ventricular systolic function is normal. The right ventricular  size is normal. There is mildly elevated pulmonary artery systolic  pressure.   3. The mitral valve is grossly normal. Moderate to severe mitral valve  regurgitation.   4. Tricuspid valve regurgitation is moderate. RA-RV peak  gradient 34  mmHg.   5. Compared to previous study on 11/20/2019, LVEF is lowered from 55-65%  to 40-45%, wall motion abnormalities are new, valvular regurgitation more  prominent.   Coronary Angioplasty 07/15/20: Previously placed stent in the proximal LAD in February 2021 is widely patent (stenting 3.0 x 46 mm and a 3.0 x 18 mm resolute Onyx DES).  D1 is occluded and that is chronic.  Planned  PCI to the circumflex coronary artery.  Successful orbital atherectomy of the proximal circumflex followed by stenting with 3.5 x 22 mm resolute Onyx DES of the proximal circumflex, stenosis reduced from 95% to 0% with  TIMI-3 to TIMI-3 flow. 110 mL contrast utilized.    LAD  (2021) 3.0 x 46 mm and a 3.0 x 18 mm resolute Onyx DES; Cx  07/15/20  3.5 x 22 mm resolute Onyx DES  EKG:   EKG 09/27/2022: Normal sinus rhythm with rate of 71 bpm, normal axis.  Low-voltage complexes otherwise normal.  Compared to 09/25/2021, no change.  Assessment     ICD-10-CM   1. Coronary artery disease of native artery of native heart with stable angina pectoris (HCC)  I25.118 EKG 12-Lead    nitroGLYCERIN (NITROSTAT) 0.4 MG SL tablet    2. Primary hypertension  I10 losartan (COZAAR) 25 MG tablet    3. Hypercholesteremia  E78.00       Meds ordered this encounter  Medications   nitroGLYCERIN (NITROSTAT) 0.4 MG SL tablet    Sig: Place 1 tablet (0.4 mg total) under the tongue every 5 (five) minutes as needed for chest pain.    Dispense:  25 tablet    Refill:  3   losartan (COZAAR) 25 MG tablet    Sig: Take 1 tablet (25 mg total) by mouth every evening.    Dispense:  90 tablet    Refill:  1    Medications Discontinued During This Encounter  Medication Reason   diclofenac Sodium (VOLTAREN) 1 % GEL    nitroGLYCERIN (NITROSTAT) 0.4 MG SL tablet Reorder    Recommendations:   LAMBROS CERRO  is a 56 y.o. 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   LAD angioplasty on 12/19/2019 and  NSTEMI on 04/20/2020 for which he underwent balloon angioplasty only with stent loss in the ostial left main which was extracted on 05/20/2020 underwent orbital atherectomy followed by stenting of the circumflex coronary artery in Sept 2021.  1. Coronary artery disease of native artery of native heart with stable angina pectoris [facility]) Patient is started having occasional episodes of exertional angina easily with rest.  For now we will continue medical therapy, his risk factors are well-controlled including lipids and hypertension.  I will start him on losartan 25 mg in the evening in view of coronary artery disease and for hypertension.  I have represcribed him sublingual nitroglycerin.  S/L NTG was prescribed and explained how to and when to use it and to notify us if there is change in frequency of use.    2. Primary hypertension Blood pressure was markedly elevated upon presentation however normalized upon sitting for a few minutes.  Hopefully losartan will also help with blood pressure control.  3. Hypercholesteremia Lipids noted excellent control.  Continue present medical therapy.  Otherwise stable from cardiac standpoint, would like to see him back in 6 months.  If he continues to have frequent episodes of angina, will consider stress testing and repeat echocardiogram at that time    [name], MD, [facility] 09/27/2022, 2:15 PM Office: [phone]

## 2022-10-29 ENCOUNTER — Other Ambulatory Visit: Payer: Self-pay | Admitting: Cardiology

## 2022-11-01 ENCOUNTER — Other Ambulatory Visit: Payer: Self-pay | Admitting: Cardiology

## 2022-11-16 DIAGNOSIS — E785 Hyperlipidemia, unspecified: Secondary | ICD-10-CM | POA: Diagnosis not present

## 2022-11-16 DIAGNOSIS — I10 Essential (primary) hypertension: Secondary | ICD-10-CM | POA: Diagnosis not present

## 2022-11-16 DIAGNOSIS — M545 Low back pain, unspecified: Secondary | ICD-10-CM | POA: Diagnosis not present

## 2022-11-16 DIAGNOSIS — I119 Hypertensive heart disease without heart failure: Secondary | ICD-10-CM | POA: Diagnosis not present

## 2022-11-16 DIAGNOSIS — I25118 Atherosclerotic heart disease of native coronary artery with other forms of angina pectoris: Secondary | ICD-10-CM | POA: Diagnosis not present

## 2022-11-20 DIAGNOSIS — R079 Chest pain, unspecified: Secondary | ICD-10-CM | POA: Diagnosis not present

## 2022-11-20 DIAGNOSIS — W19XXXA Unspecified fall, initial encounter: Secondary | ICD-10-CM | POA: Diagnosis not present

## 2023-01-26 ENCOUNTER — Other Ambulatory Visit: Payer: Self-pay

## 2023-01-26 DIAGNOSIS — I10 Essential (primary) hypertension: Secondary | ICD-10-CM

## 2023-01-26 MED ORDER — ROSUVASTATIN CALCIUM 20 MG PO TABS: 20.0000 mg | ORAL_TABLET | Freq: Every day | ORAL | 3 refills | Status: DC

## 2023-01-26 MED ORDER — METOPROLOL SUCCINATE ER 50 MG PO TB24: 50.0000 mg | ORAL_TABLET | Freq: Every day | ORAL | 3 refills | Status: DC

## 2023-01-26 MED ORDER — LOSARTAN POTASSIUM 25 MG PO TABS: 25.0000 mg | ORAL_TABLET | Freq: Every evening | ORAL | 3 refills | Status: DC

## 2023-02-15 DIAGNOSIS — M5442 Lumbago with sciatica, left side: Secondary | ICD-10-CM | POA: Diagnosis not present

## 2023-02-20 DIAGNOSIS — H5213 Myopia, bilateral: Secondary | ICD-10-CM | POA: Diagnosis not present

## 2023-03-29 ENCOUNTER — Ambulatory Visit: Payer: 59 | Admitting: Cardiology

## 2023-03-29 ENCOUNTER — Encounter: Payer: Self-pay | Admitting: Cardiology

## 2023-03-29 VITALS — BP 128/76 | HR 101 | Resp 16 | Ht 67.0 in | Wt 157.0 lb

## 2023-03-29 DIAGNOSIS — E7800 Pure hypercholesterolemia, unspecified: Secondary | ICD-10-CM

## 2023-03-29 DIAGNOSIS — I10 Essential (primary) hypertension: Secondary | ICD-10-CM

## 2023-03-29 DIAGNOSIS — I25118 Atherosclerotic heart disease of native coronary artery with other forms of angina pectoris: Secondary | ICD-10-CM | POA: Diagnosis not present

## 2023-03-29 MED ORDER — NITROGLYCERIN 0.4 MG SL SUBL: 0.4000 mg | SUBLINGUAL_TABLET | SUBLINGUAL | 3 refills | Status: DC | PRN

## 2023-03-29 NOTE — Progress Notes (Signed)
Primary Physician/Referring:  Mirna Mires, MD  Patient ID: Cody Preston, male    DOB: Oct 24, 1966, 57 y.o.   MRN: [id number]  Chief Complaint  Patient presents with   Coronary artery disease of native artery of native heart wi   Follow-up    6 months   HPI:    Cody Preston  is a 57 y.o. 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   LAD angioplasty on 12/19/2019 and  NSTEMI on 04/20/2020 for which he underwent balloon angioplasty only with stent loss in the ostial left main which was extracted on 05/20/2020 underwent orbital atherectomy followed by stenting of the circumflex coronary artery in Sept 2021.  He presents for 63-month office visit, he has not had any lifestyle limiting angina pectoris.     Past Medical History:  Diagnosis Date   Back pain    Coronary artery disease    Hypercholesteremia    Hyperlipidemia    Primary hypertension 09/27/2022   Social History   Tobacco Use   Smoking status: Never   Smokeless tobacco: Never  Substance Use Topics   Alcohol use: Yes    Alcohol/week: 1.0 standard drink of alcohol    Types: 1 Shots of liquor per week    Comment: occasionally   ROS  Review of Systems  Cardiovascular:  Positive for chest pain. Negative for dyspnea on exertion and leg swelling.   Objective  Blood pressure 128/76, pulse (!) 101, resp. rate 16, height 5\' 7"  (1.702 m), weight 157 lb (71.2 kg), SpO2 97 %.     03/29/2023    1:34 PM 09/27/2022    1:18 PM 09/27/2022    1:09 PM  Vitals with BMI  Height 5\' 7"   5\' 7"   Weight 157 lbs  152 lbs 6 oz  BMI 24.58  23.86  Systolic 128 124 782  Diastolic 76 80 104  Pulse 101 80 81      Physical Exam Neck:     Vascular: No carotid bruit or JVD.  Cardiovascular:     Rate and Rhythm: Normal rate and regular rhythm.     Pulses: Normal pulses and intact distal pulses.     Heart sounds: Normal heart sounds. No murmur heard.    No gallop.     Comments:   Pulmonary:     Effort: Pulmonary effort is normal.     Breath sounds: Normal breath sounds.  Abdominal:     General: Bowel sounds are normal.     Palpations: Abdomen is soft.  Musculoskeletal:     Right lower leg: No edema.     Left lower leg: No edema.  Skin:    Comments:      Laboratory examination:   Outside labs:   Cholesterol, total 116.000 m 11/16/2022 HDL 61.000 mg 11/16/2022 LDL 36.000 mg 11/16/2022 Triglycerides 106.000 m 11/16/2022  A1C 5.900 % 04/21/2020  Hemoglobin 11.200 g/d 09/08/2021  Creatinine, Serum 0.910 mg/ 11/16/2022 CrCl Est 85.75 11/16/2022 eGFR 105.000 mL/min/1.37m2 01/20/2021  Potassium 4.400 mm 11/16/2022 ALT (SGPT) 10.000 U/L 11/16/2022  Medications and allergies   Allergies  Allergen Reactions   Bee Venom Swelling and Other (See Comments)  Finger became blue and he had to be rushed to a hospital   Wasp Venom Swelling and Other (See Comments)    Skin turns black and blue at site stung, also     Current Outpatient Medications:    acetaminophen (TYLENOL) 325 MG tablet, Take 650 mg by mouth every 6 (six) hours as needed., Disp: , Rfl:    aspirin 81 MG chewable tablet, Chew 1 tablet (81 mg total) by mouth daily., Disp: 30 tablet, Rfl: 2   esomeprazole (NEXIUM) 20 MG capsule, Take 20 mg by mouth daily at 12 noon., Disp: , Rfl:    losartan (COZAAR) 25 MG tablet, Take 1 tablet (25 mg total) by mouth every evening., Disp: 90 tablet, Rfl: 3   metoprolol succinate (TOPROL-XL) 50 MG 24 hr tablet, Take 1 tablet (50 mg total) by mouth daily. Take with or immediately following a meal., Disp: 90 tablet, Rfl: 3   rosuvastatin (CRESTOR) 20 MG tablet, Take 1 tablet (20 mg total) by mouth  daily., Disp: 90 tablet, Rfl: 3   traMADol (ULTRAM) 50 MG tablet, Take 50 mg by mouth 3 (three) times daily as needed (for pain). , Disp: , Rfl:    nitroGLYCERIN (NITROSTAT) 0.4 MG SL tablet, Place 1 tablet (0.4 mg total) under the tongue every 5 (five) minutes as needed for chest pain., Disp: 25 tablet, Rfl: 3  Radiology:  No results found.  Cardiac Studies:   Lexiscan Tetrofosmin Stress Test  11/19/2019: Abnormal ECG stress. Intravenous Lexiscan protocol. Resting EKG/ECG demonstrated normal sinus rhythm. Peak EKG/ECG revealed 1 mm horizontal ST depression of the inferolateral leads. During infusion the patient developed no chest pain, and exhibited arm pain, dizziness, jaw pain, & headache. Large extent perfusion defect consistent with severe ischemia in the mid to distal anterior and anterolateral wall in LAD distribution.  Gated SPECT imaging of the left ventricle demonstrating akinesis in the same region.  Stress LVEF moderately depressed at 37%.  No previous exam available for comparison. High risk study.   Echocardiogram 04/20/2020:  1. Left ventricular ejection fraction, by estimation, is 40 to 45%. The left ventricle has mildly decreased function. The left ventricle  demonstrates regional wall motion abnormalities (see scoring  diagram/findings for description). Left ventricular  diastolic parameters are indeterminate. There is moderate hypokinesis of  the left ventricular, basal-mid inferior wall.   2. Right ventricular systolic function is normal. The right ventricular  size is normal. There is mildly elevated pulmonary artery systolic  pressure.   3. The mitral valve is grossly normal. Moderate to severe mitral valve  regurgitation.   4. Tricuspid valve regurgitation is moderate. RA-RV peak gradient 34  mmHg.   5. Compared to previous study on 11/20/2019, LVEF is lowered from 55-65%  to 40-45%, wall motion abnormalities are new, valvular regurgitation more  prominent.   Coronary  Angioplasty 07/15/20: Previously placed stent in the proximal LAD in February 2021 is widely patent (stenting 3.0 x 46 mm and a 3.0 x 18 mm resolute Onyx DES).  D1 is occluded and that is chronic.  Planned  PCI to the circumflex coronary artery.  Successful orbital atherectomy of the proximal circumflex followed by stenting with 3.5 x 22 mm resolute Onyx DES of the proximal circumflex, stenosis reduced from 95% to 0% with TIMI-3 to TIMI-3 flow. 110 mL contrast utilized.    LAD  (2021) 3.0 x 46 mm and a 3.0 x 18 mm resolute Onyx DES; Cx  07/15/20  3.5 x 22 mm resolute Onyx  DES  EKG:   EKG 03/29/2023: Normal sinus rhythm at rate of 83 bpm, normal axis, no evidence of ischemia, normal EKG.  No change from 09/27/2022.  Assessment     ICD-10-CM   1. Coronary artery disease of native artery of native heart with stable angina pectoris (HCC)  I25.118 EKG 12-Lead    nitroGLYCERIN (NITROSTAT) 0.4 MG SL tablet    2. Primary hypertension  I10     3. Hypercholesteremia  E78.00       Meds ordered this encounter  Medications   nitroGLYCERIN (NITROSTAT) 0.4 MG SL tablet    Sig: Place 1 tablet (0.4 mg total) under the tongue every 5 (five) minutes as needed for chest pain.    Dispense:  25 tablet    Refill:  3    Medications Discontinued During This Encounter  Medication Reason   nitroGLYCERIN (NITROSTAT) 0.4 MG SL tablet Reorder     Recommendations:   ANSELM GALLEN  is a 57 y.o. 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   LAD angioplasty on 12/19/2019 and  NSTEMI on 04/20/2020 for which he underwent balloon angioplasty only with stent loss in the ostial  left main which was extracted on 05/20/2020 underwent orbital atherectomy followed by stenting of the circumflex coronary artery in Sept 2021.  1. Coronary artery disease of native artery of native heart with stable angina pectoris [facility]) Patient is started having occasional episodes of exertional angina easily with rest.  For now we will continue medical therapy, his risk factors are well-controlled including lipids and hypertension.  I will start him on losartan 25 mg in the evening in view of coronary artery disease and for hypertension.  I have represcribed him sublingual nitroglycerin.  S/L NTG was prescribed and explained how to and when to use it and to notify us if there is change in frequency of use.    2. Primary hypertension Blood pressure was markedly elevated upon presentation however normalized upon sitting for a few minutes.  Hopefully losartan will also help with blood pressure control.  3. Hypercholesteremia Lipids noted excellent control.  Continue present medical therapy.  Otherwise stable from cardiac standpoint, would like to see him back in 6 months.  If he continues to have frequent episodes of angina, will consider stress testing and repeat echocardiogram at that time    Yates Decamp, MD, [facility] 03/29/2023, 5:36 PM Office: [phone]

## 2023-05-17 DIAGNOSIS — I25118 Atherosclerotic heart disease of native coronary artery with other forms of angina pectoris: Secondary | ICD-10-CM | POA: Diagnosis not present

## 2023-05-17 DIAGNOSIS — R222 Localized swelling, mass and lump, trunk: Secondary | ICD-10-CM | POA: Diagnosis not present

## 2023-05-17 DIAGNOSIS — E7800 Pure hypercholesterolemia, unspecified: Secondary | ICD-10-CM | POA: Diagnosis not present

## 2023-05-17 DIAGNOSIS — M545 Low back pain, unspecified: Secondary | ICD-10-CM | POA: Diagnosis not present

## 2023-06-05 ENCOUNTER — Other Ambulatory Visit: Payer: Self-pay | Admitting: Cardiology

## 2023-06-05 DIAGNOSIS — I25118 Atherosclerotic heart disease of native coronary artery with other forms of angina pectoris: Secondary | ICD-10-CM

## 2023-06-20 ENCOUNTER — Other Ambulatory Visit: Payer: Self-pay | Admitting: Cardiology

## 2023-06-20 DIAGNOSIS — I25118 Atherosclerotic heart disease of native coronary artery with other forms of angina pectoris: Secondary | ICD-10-CM

## 2023-07-13 ENCOUNTER — Other Ambulatory Visit: Payer: Self-pay

## 2023-07-13 ENCOUNTER — Emergency Department (HOSPITAL_COMMUNITY)
Admission: EM | Admit: 2023-07-13 | Discharge: 2023-07-13 | Disposition: A | Discharge status: Home / Self Care | Payer: 59 | Attending: Emergency Medicine | Admitting: Emergency Medicine

## 2023-07-13 ENCOUNTER — Encounter (HOSPITAL_COMMUNITY): Payer: Self-pay | Admitting: Emergency Medicine

## 2023-07-13 DIAGNOSIS — Y92000 Kitchen of unspecified non-institutional (private) residence as  the place of occurrence of the external cause: Secondary | ICD-10-CM | POA: Diagnosis not present

## 2023-07-13 DIAGNOSIS — T23241A Burn of second degree of multiple right fingers (nail), including thumb, initial encounter: Secondary | ICD-10-CM | POA: Insufficient documentation

## 2023-07-13 DIAGNOSIS — I10 Essential (primary) hypertension: Secondary | ICD-10-CM | POA: Diagnosis not present

## 2023-07-13 DIAGNOSIS — Z7982 Long term (current) use of aspirin: Secondary | ICD-10-CM | POA: Diagnosis not present

## 2023-07-13 DIAGNOSIS — T23201A Burn of second degree of right hand, unspecified site, initial encounter: Secondary | ICD-10-CM | POA: Diagnosis not present

## 2023-07-13 DIAGNOSIS — X102XXA Contact with fats and cooking oils, initial encounter: Secondary | ICD-10-CM | POA: Insufficient documentation

## 2023-07-13 DIAGNOSIS — Z79899 Other long term (current) drug therapy: Secondary | ICD-10-CM | POA: Insufficient documentation

## 2023-07-13 DIAGNOSIS — Z23 Encounter for immunization: Secondary | ICD-10-CM | POA: Diagnosis not present

## 2023-07-13 DIAGNOSIS — T25221A Burn of second degree of right foot, initial encounter: Secondary | ICD-10-CM | POA: Insufficient documentation

## 2023-07-13 DIAGNOSIS — R0602 Shortness of breath: Secondary | ICD-10-CM | POA: Insufficient documentation

## 2023-07-13 DIAGNOSIS — I2510 Atherosclerotic heart disease of native coronary artery without angina pectoris: Secondary | ICD-10-CM | POA: Diagnosis not present

## 2023-07-13 DIAGNOSIS — R0789 Other chest pain: Secondary | ICD-10-CM | POA: Insufficient documentation

## 2023-07-13 DIAGNOSIS — T310 Burns involving less than 10% of body surface: Secondary | ICD-10-CM | POA: Diagnosis not present

## 2023-07-13 DIAGNOSIS — T300 Burn of unspecified body region, unspecified degree: Secondary | ICD-10-CM

## 2023-07-13 DIAGNOSIS — S99921A Unspecified injury of right foot, initial encounter: Secondary | ICD-10-CM | POA: Diagnosis present

## 2023-07-13 LAB — CBC WITH DIFFERENTIAL/PLATELET
Abs Immature Granulocytes: 0.03 10*3/uL (ref 0.00–0.07)
Basophils Absolute: 0.1 10*3/uL (ref 0.0–0.1)
Basophils Relative: 1 %
Eosinophils Absolute: 0.1 10*3/uL (ref 0.0–0.5)
Eosinophils Relative: 1 %
HCT: 42.6 % (ref 39.0–52.0)
Hemoglobin: 13.8 g/dL (ref 13.0–17.0)
Immature Granulocytes: 0 %
Lymphocytes Relative: 12 %
Lymphs Abs: 0.8 10*3/uL (ref 0.7–4.0)
MCH: 26.7 pg (ref 26.0–34.0)
MCHC: 32.4 g/dL (ref 30.0–36.0)
MCV: 82.6 fL (ref 80.0–100.0)
Monocytes Absolute: 0.7 10*3/uL (ref 0.1–1.0)
Monocytes Relative: 10 %
Neutro Abs: 5.1 10*3/uL (ref 1.7–7.7)
Neutrophils Relative %: 76 %
Platelets: 420 10*3/uL — ABNORMAL HIGH (ref 150–400)
RBC: 5.16 MIL/uL (ref 4.22–5.81)
RDW: 14.6 % (ref 11.5–15.5)
WBC: 6.8 10*3/uL (ref 4.0–10.5)
nRBC: 0 % (ref 0.0–0.2)

## 2023-07-13 LAB — BASIC METABOLIC PANEL WITH GFR
Anion gap: 8 (ref 5–15)
BUN: 13 mg/dL (ref 6–20)
CO2: 23 mmol/L (ref 22–32)
Calcium: 9 mg/dL (ref 8.9–10.3)
Chloride: 99 mmol/L (ref 98–111)
Creatinine, Ser: 1.08 mg/dL (ref 0.61–1.24)
GFR, Estimated: >60 mL/min (ref >60)
Glucose, Bld: 94 mg/dL (ref 70–99)
Potassium: 5 mmol/L (ref 3.5–5.1)
Sodium: 130 mmol/L — ABNORMAL LOW (ref 135–145)

## 2023-07-13 MED ORDER — BACITRACIN ZINC 500 UNIT/GM EX OINT
TOPICAL_OINTMENT | Freq: Once | CUTANEOUS | Status: AC
  Administered 2023-07-13: 1 via TOPICAL
  Filled 2023-07-13: qty 7.2
  Filled 2023-07-13: qty 2.7

## 2023-07-13 MED ORDER — LIDOCAINE HCL (PF) 1 % IJ SOLN
30.0000 mL | Freq: Once | INTRAMUSCULAR | Status: AC
  Administered 2023-07-13: 30 mL
  Filled 2023-07-13: qty 30

## 2023-07-13 MED ORDER — OXYCODONE-ACETAMINOPHEN 5-325 MG PO TABS
1.0000 | ORAL_TABLET | Freq: Once | ORAL | Status: AC
  Administered 2023-07-13: 1 via ORAL
  Filled 2023-07-13: qty 1

## 2023-07-13 MED ORDER — TETANUS-DIPHTH-ACELL PERTUSSIS 5-2.5-18.5 LF-MCG/0.5 IM SUSY
0.5000 mL | PREFILLED_SYRINGE | Freq: Once | INTRAMUSCULAR | Status: AC
  Administered 2023-07-13: 0.5 mL via INTRAMUSCULAR
  Filled 2023-07-13: qty 0.5

## 2023-07-13 MED ORDER — OXYCODONE-ACETAMINOPHEN 5-325 MG PO TABS: 1.0000 | ORAL_TABLET | Freq: Four times a day (QID) | ORAL | 0 refills | Status: DC | PRN
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
## 2023-08-23 DIAGNOSIS — T23331A Burn of third degree of multiple right fingers (nail), not including thumb, initial encounter: Secondary | ICD-10-CM | POA: Diagnosis not present

## 2023-08-23 DIAGNOSIS — D171 Benign lipomatous neoplasm of skin and subcutaneous tissue of trunk: Secondary | ICD-10-CM | POA: Diagnosis not present

## 2023-08-23 DIAGNOSIS — T23241A Burn of second degree of multiple right fingers (nail), including thumb, initial encounter: Secondary | ICD-10-CM | POA: Diagnosis not present

## 2023-08-23 DIAGNOSIS — R000 Tachycardia, unspecified: Secondary | ICD-10-CM | POA: Diagnosis not present

## 2023-08-23 DIAGNOSIS — I25118 Atherosclerotic heart disease of native coronary artery with other forms of angina pectoris: Secondary | ICD-10-CM | POA: Diagnosis not present

## 2023-08-23 DIAGNOSIS — T25221A Burn of second degree of right foot, initial encounter: Secondary | ICD-10-CM | POA: Diagnosis not present

## 2023-08-23 DIAGNOSIS — E7800 Pure hypercholesterolemia, unspecified: Secondary | ICD-10-CM | POA: Diagnosis not present

## 2023-11-23 DIAGNOSIS — L720 Epidermal cyst: Secondary | ICD-10-CM | POA: Diagnosis not present

## 2023-11-28 DIAGNOSIS — L720 Epidermal cyst: Secondary | ICD-10-CM | POA: Diagnosis not present

## 2023-12-06 DIAGNOSIS — M545 Low back pain, unspecified: Secondary | ICD-10-CM | POA: Diagnosis not present

## 2023-12-06 DIAGNOSIS — D171 Benign lipomatous neoplasm of skin and subcutaneous tissue of trunk: Secondary | ICD-10-CM | POA: Diagnosis not present

## 2023-12-06 DIAGNOSIS — E7800 Pure hypercholesterolemia, unspecified: Secondary | ICD-10-CM | POA: Diagnosis not present

## 2023-12-06 DIAGNOSIS — I25118 Atherosclerotic heart disease of native coronary artery with other forms of angina pectoris: Secondary | ICD-10-CM | POA: Diagnosis not present

## 2023-12-26 DIAGNOSIS — L02212 Cutaneous abscess of back [any part, except buttock]: Secondary | ICD-10-CM | POA: Diagnosis not present

## 2024-02-02 ENCOUNTER — Other Ambulatory Visit: Payer: Self-pay | Admitting: Cardiology

## 2024-02-02 ENCOUNTER — Other Ambulatory Visit: Payer: Self-pay

## 2024-02-02 DIAGNOSIS — I10 Essential (primary) hypertension: Secondary | ICD-10-CM

## 2024-02-02 MED ORDER — LOSARTAN POTASSIUM 25 MG PO TABS: 25.0000 mg | ORAL_TABLET | Freq: Every evening | ORAL | 30 refills | Status: DC

## 2024-02-02 MED ORDER — METOPROLOL SUCCINATE ER 50 MG PO TB24: 50.0000 mg | ORAL_TABLET | Freq: Every day | ORAL | 0 refills | Status: DC

## 2024-02-02 MED ORDER — ROSUVASTATIN CALCIUM 20 MG PO TABS: 20.0000 mg | ORAL_TABLET | Freq: Every day | ORAL | 0 refills | Status: DC

## 2024-02-02 NOTE — Addendum Note (Signed)
 Addended by: Margaret Pyle D on: 02/02/2024 04:36 PM   Modules accepted: Orders

## 2024-02-03 MED ORDER — LOSARTAN POTASSIUM 25 MG PO TABS: 25.0000 mg | ORAL_TABLET | Freq: Every evening | ORAL | 0 refills | Status: DC

## 2024-02-03 NOTE — Addendum Note (Signed)
 Addended by: Margaret Pyle D on: 02/03/2024 09:24 AM   Modules accepted: Orders

## 2024-03-14 DIAGNOSIS — N390 Urinary tract infection, site not specified: Secondary | ICD-10-CM | POA: Diagnosis not present

## 2024-03-28 ENCOUNTER — Ambulatory Visit: Payer: 59 | Admitting: Cardiology

## 2024-04-02 ENCOUNTER — Encounter: Payer: Self-pay | Admitting: Cardiology

## 2024-04-02 ENCOUNTER — Ambulatory Visit: Attending: Cardiology | Admitting: Cardiology

## 2024-04-02 VITALS — BP 134/85 | HR 81 | Ht 67.0 in | Wt 152.0 lb

## 2024-04-02 DIAGNOSIS — E7800 Pure hypercholesterolemia, unspecified: Secondary | ICD-10-CM | POA: Diagnosis not present

## 2024-04-02 DIAGNOSIS — I255 Ischemic cardiomyopathy: Secondary | ICD-10-CM

## 2024-04-02 DIAGNOSIS — I10 Essential (primary) hypertension: Secondary | ICD-10-CM | POA: Diagnosis not present

## 2024-04-02 DIAGNOSIS — I25118 Atherosclerotic heart disease of native coronary artery with other forms of angina pectoris: Secondary | ICD-10-CM | POA: Diagnosis not present

## 2024-04-02 MED ORDER — NITROGLYCERIN 0.4 MG SL SUBL
0.4000 mg | SUBLINGUAL_TABLET | SUBLINGUAL | 1 refills | Status: DC | PRN
Start: 2024-04-02 — End: 2024-05-01

## 2024-04-02 NOTE — Patient Instructions (Addendum)
 Medication Instructions:  The current medical regimen is effective;  continue present plan and medications.  *If you need a refill on your cardiac medications before your next appointment, please call your pharmacy*  Follow-Up: At [facility], you and your health needs are our priority.  As part of our continuing mission to provide you with exceptional heart care, our providers are all part of one team.  This team includes your primary Cardiologist (physician) and Advanced Practice Providers or APPs (Physician Assistants and Nurse Practitioners) who all work together to provide you with the care you need, when you need it.  Your next appointment:   1 year(s)  Provider:   Dr Berry Bristol    We recommend signing up for the patient portal called "MyChart".  Sign up information is provided on this After Visit Summary.  MyChart is used to connect with patients for Virtual Visits (Telemedicine).  Patients are able to view lab/test results, encounter notes, upcoming appointments, etc.  Non-urgent messages can be sent to your provider as well.   To learn more about what you can do with MyChart, go to [email or web address].
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
## 2024-04-10 DIAGNOSIS — M549 Dorsalgia, unspecified: Secondary | ICD-10-CM | POA: Diagnosis not present

## 2024-04-10 DIAGNOSIS — M545 Low back pain, unspecified: Secondary | ICD-10-CM | POA: Diagnosis not present

## 2024-04-10 DIAGNOSIS — H6123 Impacted cerumen, bilateral: Secondary | ICD-10-CM | POA: Diagnosis not present

## 2024-04-10 DIAGNOSIS — I25118 Atherosclerotic heart disease of native coronary artery with other forms of angina pectoris: Secondary | ICD-10-CM | POA: Diagnosis not present

## 2024-04-10 DIAGNOSIS — E7800 Pure hypercholesterolemia, unspecified: Secondary | ICD-10-CM | POA: Diagnosis not present

## 2024-04-10 DIAGNOSIS — D171 Benign lipomatous neoplasm of skin and subcutaneous tissue of trunk: Secondary | ICD-10-CM | POA: Diagnosis not present

## 2024-04-13 ENCOUNTER — Encounter (HOSPITAL_COMMUNITY): Payer: Self-pay | Admitting: Cardiology

## 2024-05-01 ENCOUNTER — Telehealth: Payer: Self-pay | Admitting: *Deleted

## 2024-05-01 ENCOUNTER — Other Ambulatory Visit: Payer: Self-pay

## 2024-05-01 DIAGNOSIS — I10 Essential (primary) hypertension: Secondary | ICD-10-CM

## 2024-05-01 DIAGNOSIS — I25118 Atherosclerotic heart disease of native coronary artery with other forms of angina pectoris: Secondary | ICD-10-CM

## 2024-05-01 MED ORDER — METOPROLOL SUCCINATE ER 50 MG PO TB24: 50.0000 mg | ORAL_TABLET | Freq: Every day | ORAL | 3 refills | Status: DC

## 2024-05-01 MED ORDER — NITROGLYCERIN 0.4 MG SL SUBL: 0.4000 mg | SUBLINGUAL_TABLET | SUBLINGUAL | 3 refills | Status: DC | PRN

## 2024-05-01 MED ORDER — ROSUVASTATIN CALCIUM 20 MG PO TABS: 20.0000 mg | ORAL_TABLET | Freq: Every day | ORAL | 3 refills | Status: AC

## 2024-05-01 MED ORDER — LOSARTAN POTASSIUM 25 MG PO TABS: 25.0000 mg | ORAL_TABLET | Freq: Every evening | ORAL | 3 refills | Status: DC

## 2024-05-01 NOTE — Telephone Encounter (Addendum)
   Pre-operative Risk Assessment    Patient Name: Cody Preston  DOB: 1966/05/28 MRN: [id number]   Date of last office visit: 04/02/24 DR. GANJI Date of next office visit: NONE   Request for Surgical Clearance    Procedure:  Excision of peritesticular cystic structure with possible inguinal orchiectomy   Date of Surgery:  Clearance 07/11/24                                Surgeon:  DR. RONALD DAVIS III Surgeon's Group or Practice Name:  [facility] Phone number: [phone]  Fax number: [phone]    Type of Clearance Requested:   - Medical  - Pharmacy:  Hold Aspirin      Type of Anesthesia:  Not Indicated; sent message to surgeon's office    Additional requests/questions:    Cody Preston   05/01/2024, 11:26 AM

## 2024-05-04 ENCOUNTER — Encounter: Payer: Self-pay | Admitting: Cardiology

## 2024-05-04 NOTE — Telephone Encounter (Signed)
 Patient will need a repeat echocardiogram based on Dr. Godfrey recent office note  Preop evaluation Patient has been evaluated by urology and appears that he may need some testicular surgery, he has remained stable without unstable symptoms of angina and he is on appropriate medical therapy as well.  I do not think he needs preoperative cardiac workup and he can be taken up for the upcoming surgery with low risk.  His last echocardiogram in 2021 had revealed EF of 40 to 45%, hence on repeat echocardiogram and if stable, no need for stress testing.  Otherwise we will make further recommendations.  Please arrange

## 2024-05-04 NOTE — Telephone Encounter (Signed)
 I will send a message to Dr. Godfrey nurse and the echo scheduler in regard to echo ordered on 04/02/24 in his ov notes. Echo will need to be done before MD will sign off on clearance.

## 2024-05-07 NOTE — Telephone Encounter (Signed)
 I will send notes to surgeon office as FYI we have been trying to reach the pt to schedule an echo that is needed for preop clearance.   Please see all notes. Echo scheduler has left messages though no call back from pt to schedule.

## 2024-05-08 ENCOUNTER — Inpatient Hospital Stay (HOSPITAL_COMMUNITY)

## 2024-05-08 ENCOUNTER — Emergency Department (HOSPITAL_COMMUNITY)

## 2024-05-08 ENCOUNTER — Inpatient Hospital Stay (HOSPITAL_COMMUNITY)
Admission: EM | Admit: 2024-05-08 | Discharge: 2024-05-17 | DRG: 481 | Disposition: A | Discharge status: Skilled Nursing Facility | Attending: Internal Medicine | Admitting: Internal Medicine

## 2024-05-08 ENCOUNTER — Encounter (HOSPITAL_COMMUNITY): Payer: Self-pay

## 2024-05-08 ENCOUNTER — Other Ambulatory Visit: Payer: Self-pay

## 2024-05-08 DIAGNOSIS — E871 Hypo-osmolality and hyponatremia: Secondary | ICD-10-CM | POA: Diagnosis present

## 2024-05-08 DIAGNOSIS — E785 Hyperlipidemia, unspecified: Secondary | ICD-10-CM | POA: Diagnosis not present

## 2024-05-08 DIAGNOSIS — Z83438 Family history of other disorder of lipoprotein metabolism and other lipidemia: Secondary | ICD-10-CM

## 2024-05-08 DIAGNOSIS — Z91030 Bee allergy status: Secondary | ICD-10-CM | POA: Diagnosis not present

## 2024-05-08 DIAGNOSIS — Z86718 Personal history of other venous thrombosis and embolism: Secondary | ICD-10-CM

## 2024-05-08 DIAGNOSIS — R7303 Prediabetes: Secondary | ICD-10-CM | POA: Diagnosis present

## 2024-05-08 DIAGNOSIS — I252 Old myocardial infarction: Secondary | ICD-10-CM

## 2024-05-08 DIAGNOSIS — I499 Cardiac arrhythmia, unspecified: Secondary | ICD-10-CM | POA: Diagnosis not present

## 2024-05-08 DIAGNOSIS — S72002S Fracture of unspecified part of neck of left femur, sequela: Secondary | ICD-10-CM | POA: Diagnosis not present

## 2024-05-08 DIAGNOSIS — S72032A Displaced midcervical fracture of left femur, initial encounter for closed fracture: Secondary | ICD-10-CM | POA: Diagnosis not present

## 2024-05-08 DIAGNOSIS — I214 Non-ST elevation (NSTEMI) myocardial infarction: Secondary | ICD-10-CM | POA: Diagnosis not present

## 2024-05-08 DIAGNOSIS — Z955 Presence of coronary angioplasty implant and graft: Secondary | ICD-10-CM | POA: Diagnosis not present

## 2024-05-08 DIAGNOSIS — S72002A Fracture of unspecified part of neck of left femur, initial encounter for closed fracture: Secondary | ICD-10-CM | POA: Diagnosis not present

## 2024-05-08 DIAGNOSIS — S7222XA Displaced subtrochanteric fracture of left femur, initial encounter for closed fracture: Principal | ICD-10-CM | POA: Diagnosis present

## 2024-05-08 DIAGNOSIS — E7800 Pure hypercholesterolemia, unspecified: Secondary | ICD-10-CM | POA: Diagnosis present

## 2024-05-08 DIAGNOSIS — S72142A Displaced intertrochanteric fracture of left femur, initial encounter for closed fracture: Secondary | ICD-10-CM | POA: Diagnosis not present

## 2024-05-08 DIAGNOSIS — Z8261 Family history of arthritis: Secondary | ICD-10-CM | POA: Diagnosis not present

## 2024-05-08 DIAGNOSIS — I10 Essential (primary) hypertension: Secondary | ICD-10-CM | POA: Diagnosis present

## 2024-05-08 DIAGNOSIS — R296 Repeated falls: Secondary | ICD-10-CM | POA: Diagnosis present

## 2024-05-08 DIAGNOSIS — S72042A Displaced fracture of base of neck of left femur, initial encounter for closed fracture: Secondary | ICD-10-CM | POA: Diagnosis not present

## 2024-05-08 DIAGNOSIS — I2510 Atherosclerotic heart disease of native coronary artery without angina pectoris: Secondary | ICD-10-CM | POA: Diagnosis present

## 2024-05-08 DIAGNOSIS — R911 Solitary pulmonary nodule: Secondary | ICD-10-CM | POA: Diagnosis present

## 2024-05-08 DIAGNOSIS — Z741 Need for assistance with personal care: Secondary | ICD-10-CM | POA: Diagnosis not present

## 2024-05-08 DIAGNOSIS — Z801 Family history of malignant neoplasm of trachea, bronchus and lung: Secondary | ICD-10-CM | POA: Diagnosis not present

## 2024-05-08 DIAGNOSIS — Z743 Need for continuous supervision: Secondary | ICD-10-CM | POA: Diagnosis not present

## 2024-05-08 DIAGNOSIS — Z7401 Bed confinement status: Secondary | ICD-10-CM | POA: Diagnosis not present

## 2024-05-08 DIAGNOSIS — R159 Full incontinence of feces: Secondary | ICD-10-CM | POA: Diagnosis present

## 2024-05-08 DIAGNOSIS — Z7982 Long term (current) use of aspirin: Secondary | ICD-10-CM | POA: Diagnosis not present

## 2024-05-08 DIAGNOSIS — Z8249 Family history of ischemic heart disease and other diseases of the circulatory system: Secondary | ICD-10-CM

## 2024-05-08 DIAGNOSIS — Z4789 Encounter for other orthopedic aftercare: Secondary | ICD-10-CM | POA: Diagnosis not present

## 2024-05-08 DIAGNOSIS — S7292XA Unspecified fracture of left femur, initial encounter for closed fracture: Secondary | ICD-10-CM | POA: Diagnosis not present

## 2024-05-08 DIAGNOSIS — K5909 Other constipation: Secondary | ICD-10-CM | POA: Diagnosis present

## 2024-05-08 DIAGNOSIS — M6281 Muscle weakness (generalized): Secondary | ICD-10-CM | POA: Diagnosis not present

## 2024-05-08 DIAGNOSIS — Z820 Family history of epilepsy and other diseases of the nervous system: Secondary | ICD-10-CM

## 2024-05-08 DIAGNOSIS — R2681 Unsteadiness on feet: Secondary | ICD-10-CM | POA: Diagnosis not present

## 2024-05-08 DIAGNOSIS — M25569 Pain in unspecified knee: Secondary | ICD-10-CM | POA: Diagnosis not present

## 2024-05-08 DIAGNOSIS — Z79899 Other long term (current) drug therapy: Secondary | ICD-10-CM | POA: Diagnosis not present

## 2024-05-08 DIAGNOSIS — I255 Ischemic cardiomyopathy: Secondary | ICD-10-CM | POA: Diagnosis present

## 2024-05-08 DIAGNOSIS — S72002D Fracture of unspecified part of neck of left femur, subsequent encounter for closed fracture with routine healing: Secondary | ICD-10-CM | POA: Diagnosis not present

## 2024-05-08 DIAGNOSIS — R000 Tachycardia, unspecified: Secondary | ICD-10-CM | POA: Diagnosis not present

## 2024-05-08 DIAGNOSIS — S72009A Fracture of unspecified part of neck of unspecified femur, initial encounter for closed fracture: Secondary | ICD-10-CM | POA: Diagnosis not present

## 2024-05-08 DIAGNOSIS — J9811 Atelectasis: Secondary | ICD-10-CM | POA: Diagnosis not present

## 2024-05-08 DIAGNOSIS — Z01810 Encounter for preprocedural cardiovascular examination: Secondary | ICD-10-CM | POA: Diagnosis not present

## 2024-05-08 DIAGNOSIS — S72009D Fracture of unspecified part of neck of unspecified femur, subsequent encounter for closed fracture with routine healing: Secondary | ICD-10-CM | POA: Diagnosis not present

## 2024-05-08 DIAGNOSIS — Z86711 Personal history of pulmonary embolism: Secondary | ICD-10-CM

## 2024-05-08 DIAGNOSIS — W010XXA Fall on same level from slipping, tripping and stumbling without subsequent striking against object, initial encounter: Secondary | ICD-10-CM | POA: Diagnosis present

## 2024-05-08 DIAGNOSIS — W1841XA Slipping, tripping and stumbling without falling due to stepping on object, initial encounter: Secondary | ICD-10-CM | POA: Diagnosis not present

## 2024-05-08 DIAGNOSIS — Y92524 Gas station as the place of occurrence of the external cause: Secondary | ICD-10-CM | POA: Diagnosis not present

## 2024-05-08 DIAGNOSIS — I700 Atherosclerosis of aorta: Secondary | ICD-10-CM | POA: Diagnosis not present

## 2024-05-08 DIAGNOSIS — M79606 Pain in leg, unspecified: Secondary | ICD-10-CM | POA: Diagnosis not present

## 2024-05-08 DIAGNOSIS — I25119 Atherosclerotic heart disease of native coronary artery with unspecified angina pectoris: Secondary | ICD-10-CM | POA: Diagnosis not present

## 2024-05-08 LAB — CBC WITH DIFFERENTIAL/PLATELET
Abs Immature Granulocytes: 0.04 K/uL (ref 0.00–0.07)
Basophils Absolute: 0.1 K/uL (ref 0.0–0.1)
Basophils Relative: 1 %
Eosinophils Absolute: 0.1 K/uL (ref 0.0–0.5)
Eosinophils Relative: 1 %
HCT: 40.5 % (ref 39.0–52.0)
Hemoglobin: 13.2 g/dL (ref 13.0–17.0)
Immature Granulocytes: 1 %
Lymphocytes Relative: 10 %
Lymphs Abs: 0.8 K/uL (ref 0.7–4.0)
MCH: 27.2 pg (ref 26.0–34.0)
MCHC: 32.6 g/dL (ref 30.0–36.0)
MCV: 83.5 fL (ref 80.0–100.0)
Monocytes Absolute: 0.7 K/uL (ref 0.1–1.0)
Monocytes Relative: 9 %
Neutro Abs: 6.5 K/uL (ref 1.7–7.7)
Neutrophils Relative %: 78 %
Platelets: 356 K/uL (ref 150–400)
RBC: 4.85 MIL/uL (ref 4.22–5.81)
RDW: 14.6 % (ref 11.5–15.5)
WBC: 8.2 K/uL (ref 4.0–10.5)
nRBC: 0 % (ref 0.0–0.2)

## 2024-05-08 LAB — BASIC METABOLIC PANEL WITH GFR
Anion gap: 7 (ref 5–15)
BUN: 11 mg/dL (ref 6–20)
CO2: 25 mmol/L (ref 22–32)
Calcium: 8.5 mg/dL — ABNORMAL LOW (ref 8.9–10.3)
Chloride: 99 mmol/L (ref 98–111)
Creatinine, Ser: 0.94 mg/dL (ref 0.61–1.24)
GFR, Estimated: >60 mL/min (ref >60)
Glucose, Bld: 109 mg/dL — ABNORMAL HIGH (ref 70–99)
Potassium: 4.7 mmol/L (ref 3.5–5.1)
Sodium: 131 mmol/L — ABNORMAL LOW (ref 135–145)

## 2024-05-08 LAB — ETHANOL: Alcohol, Ethyl (B): <15 mg/dL (ref <15)

## 2024-05-08 MED ORDER — ONDANSETRON HCL 4 MG/2ML IJ SOLN
4.0000 mg | Freq: Four times a day (QID) | INTRAMUSCULAR | Status: DC | PRN
  Administered 2024-05-09 – 2024-05-15 (×3): 4 mg via INTRAVENOUS
  Filled 2024-05-08 (×3): qty 2

## 2024-05-08 MED ORDER — PANTOPRAZOLE SODIUM 40 MG PO TBEC
40.0000 mg | DELAYED_RELEASE_TABLET | Freq: Every day | ORAL | Status: DC
  Administered 2024-05-09 – 2024-05-17 (×9): 40 mg via ORAL
  Filled 2024-05-08 (×2): qty 1
  Filled 2024-05-08: qty 2
  Filled 2024-05-08 (×6): qty 1

## 2024-05-08 MED ORDER — ACETAMINOPHEN 325 MG PO TABS
650.0000 mg | ORAL_TABLET | Freq: Four times a day (QID) | ORAL | Status: DC | PRN
  Administered 2024-05-09 – 2024-05-17 (×10): 650 mg via ORAL
  Filled 2024-05-08 (×10): qty 2

## 2024-05-08 MED ORDER — HYDROMORPHONE HCL 1 MG/ML IJ SOLN
0.5000 mg | INTRAMUSCULAR | Status: DC | PRN
  Administered 2024-05-08 – 2024-05-15 (×11): 0.5 mg via INTRAVENOUS
  Filled 2024-05-08: qty 1
  Filled 2024-05-08 (×2): qty 0.5
  Filled 2024-05-08 (×2): qty 1
  Filled 2024-05-08: qty 0.5
  Filled 2024-05-08: qty 1
  Filled 2024-05-08 (×2): qty 0.5
  Filled 2024-05-08: qty 1
  Filled 2024-05-08 (×3): qty 0.5

## 2024-05-08 MED ORDER — SENNOSIDES-DOCUSATE SODIUM 8.6-50 MG PO TABS: 1.0000 | ORAL_TABLET | Freq: Every evening | ORAL | Status: DC | PRN

## 2024-05-08 MED ORDER — ASPIRIN 81 MG PO CHEW
81.0000 mg | CHEWABLE_TABLET | Freq: Every day | ORAL | Status: DC
  Administered 2024-05-09 – 2024-05-10 (×2): 81 mg via ORAL
  Filled 2024-05-08 (×2): qty 1

## 2024-05-08 MED ORDER — ONDANSETRON HCL 4 MG PO TABS
4.0000 mg | ORAL_TABLET | Freq: Four times a day (QID) | ORAL | Status: DC | PRN
Start: 2024-05-08 — End: 2024-05-17
  Administered 2024-05-12 – 2024-05-14 (×2): 4 mg via ORAL
  Filled 2024-05-08 (×2): qty 1

## 2024-05-08 MED ORDER — FENTANYL CITRATE PF 50 MCG/ML IJ SOSY
50.0000 ug | PREFILLED_SYRINGE | Freq: Once | INTRAMUSCULAR | Status: AC
  Administered 2024-05-08: 50 ug via INTRAVENOUS
  Filled 2024-05-08: qty 1

## 2024-05-08 MED ORDER — METOPROLOL SUCCINATE ER 50 MG PO TB24
50.0000 mg | ORAL_TABLET | Freq: Every day | ORAL | Status: DC
  Administered 2024-05-09 – 2024-05-11 (×3): 50 mg via ORAL
  Filled 2024-05-08 (×2): qty 1
  Filled 2024-05-08: qty 2
  Filled 2024-05-08: qty 1

## 2024-05-08 MED ORDER — MORPHINE SULFATE (PF) 4 MG/ML IV SOLN
4.0000 mg | Freq: Once | INTRAVENOUS | Status: DC
  Filled 2024-05-08: qty 1

## 2024-05-08 MED ORDER — ACETAMINOPHEN 650 MG RE SUPP: 650.0000 mg | Freq: Four times a day (QID) | RECTAL | Status: DC | PRN

## 2024-05-08 MED ORDER — MORPHINE SULFATE (PF) 4 MG/ML IV SOLN
4.0000 mg | Freq: Once | INTRAVENOUS | Status: AC
  Administered 2024-05-08: 4 mg via INTRAVENOUS
  Filled 2024-05-08: qty 1

## 2024-05-08 MED ORDER — ROSUVASTATIN CALCIUM 20 MG PO TABS
20.0000 mg | ORAL_TABLET | Freq: Every day | ORAL | Status: DC
  Administered 2024-05-09 – 2024-05-17 (×9): 20 mg via ORAL
  Filled 2024-05-08 (×9): qty 1
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
## 2024-05-08 NOTE — ED Provider Notes (Signed)
 [facility] Provider Note   CSN: [id number] Arrival date & time: 05/08/24  1509     Patient presents with: No chief complaint on file.   Cody Preston is a 58 y.o. male.  {Add pertinent medical, surgical, social history, OB history to HPI:4250} 58 year old male with prior medical history as detailed below presents for evaluation.  Patient tripped at the gas station.  He landed hard on his left hip.  He complains of significant pain to the left hip and left femur and left knee.  He is unable to stand after the fall.  Patient was given 150 mcg of fentanyl  prior to arrival.  This has helped his pain somewhat.  He denies head injury or neck pain.  Patient is incontinent of stool.  He reports that this is a chronic issue.  He typically stuffs tissue paper into his underwear to control the stool.  The history is provided by the patient, medical records and the EMS personnel.       Prior to Admission medications   Medication Sig Start Date End Date Taking? Authorizing Provider  acetaminophen  (TYLENOL ) 325 MG tablet Take 650 mg by mouth every 6 (six) hours as needed.    [provider]  aspirin  81 MG chewable tablet Chew 1 tablet (81 mg total) by mouth daily. 04/22/20   Patwardhan, Newman PARAS, MD  losartan  (COZAAR ) 25 MG tablet Take 1 tablet (25 mg total) by mouth every evening. 05/01/24   Ladona Heinz, MD  metoprolol  succinate (TOPROL -XL) 50 MG 24 hr tablet Take 1 tablet (50 mg total) by mouth daily. Take with or immediately following a meal. 05/01/24   Ladona Heinz, MD  nitroGLYCERIN  (NITROSTAT ) 0.4 MG SL tablet Place 1 tablet (0.4 mg total) under the tongue every 5 (five) minutes as needed for chest pain. 05/01/24   Ladona Heinz, MD  rosuvastatin  (CRESTOR ) 20 MG tablet Take 1 tablet (20 mg total) by mouth daily. 05/01/24   Ladona Heinz, MD  traMADol  (ULTRAM ) 50 MG tablet Take 50 mg by mouth 3 (three) times daily as needed (for pain).  05/23/20   [provider]    Allergies: Bee venom and Wasp venom    Review of Systems  All other systems reviewed and are negative.   Updated Vital Signs BP 128/79   Pulse 91   Temp 97.6 F (36.4 C)   Resp 18   Ht 5' 6 (1.676 m)   Wt 68.9 kg   SpO2 100%   BMI 24.53 kg/m   Physical Exam Vitals and nursing note reviewed.  Constitutional:      General: He is not in acute distress.    Appearance: Normal appearance. He is well-developed.  HENT:     Head: Normocephalic and atraumatic.  Eyes:     Conjunctiva/sclera: Conjunctivae normal.     Pupils: Pupils are equal, round, and reactive to light.  Cardiovascular:     Rate and Rhythm: Normal rate and regular rhythm.     Heart sounds: Normal heart sounds.  Pulmonary:     Effort: Pulmonary effort is normal. No respiratory distress.     Breath sounds: Normal breath sounds.  Abdominal:     General: There is no distension.     Palpations: Abdomen is soft.     Tenderness: There is no abdominal tenderness.  Genitourinary:    Comments: Patient is incontinent of stool.  This appears to be chronic.  His underwear is stuffed with  toilet paper to help control his incontinence. Musculoskeletal:        General: Tenderness present. No deformity. Normal range of motion.     Cervical back: Normal range of motion and neck supple.     Comments: Left hip is slightly flexed with left knee also held in a slightly flexed position.  Patient complains of significant pain diffusely to the lateral aspect of the left hip, left femur.  Skin:    General: Skin is warm and dry.  Neurological:     General: No focal deficit present.     Mental Status: He is alert and oriented to person, place, and time.     (all labs ordered are listed, but only abnormal results are displayed) Labs Reviewed  BASIC METABOLIC PANEL WITH GFR  ETHANOL  CBC WITH DIFFERENTIAL/PLATELET    EKG: None  Radiology: No results found.  {Document cardiac monitor, telemetry  assessment procedure when appropriate:32947} Procedures   Medications Ordered in the ED  morphine  (PF) 4 MG/ML injection 4 mg (has no administration in time range)      {Click here for ABCD2, HEART and other calculators REFRESH Note before signing:1}                              Medical Decision Making Amount and/or Complexity of Data Reviewed Labs: ordered. Radiology: ordered.  Risk Prescription drug management.    Medical Screen Complete  This patient presented to the ED with complaint of ***.  This complaint involves an extensive number of treatment options. The initial differential diagnosis includes, but is not limited to, ***  This presentation is: {IllnessRisk:19196::***,Acute,Chronic,Self-Limited,Previously Undiagnosed,Uncertain Prognosis,Complicated,Systemic Symptoms,Threat to Life/Bodily Function}    Co morbidities that complicated the patient's evaluation  ***   Additional history obtained:  Additional history obtained from {History source:19196::EMS,Spouse,Family,Friend,Caregiver} External records from outside sources obtained and reviewed including prior ED visits and prior Inpatient records.    Lab Tests:  I ordered and personally interpreted labs.  The pertinent results include:  ***   Imaging Studies ordered:  I ordered imaging studies including ***  I independently visualized and interpreted obtained imaging which showed *** I agree with the radiologist interpretation.   Cardiac Monitoring:  The patient was maintained on a cardiac monitor.  I personally viewed and interpreted the cardiac monitor which showed an underlying rhythm of: ***   Medicines ordered:  I ordered medication including ***  for ***  Reevaluation of the patient after these medicines showed that the patient: {resolved/improved/worsened:23923::improved}    Test Considered:  ***   Critical Interventions:  ***   Consultations  Obtained:  I consulted ***,  and discussed lab and imaging findings as well as pertinent plan of care.    Problem List / ED Course:  ***   Reevaluation:  After the interventions noted above, I reevaluated the patient and found that they have: {resolved/improved/worsened:23923::improved}   Social Determinants of Health:  ***   Disposition:  After consideration of the diagnostic results and the patients response to treatment, I feel that the patent would benefit from ***.    {Document critical care time when appropriate  Document review of labs and clinical decision tools ie CHADS2VASC2, etc  Document your independent review of radiology images and any outside records  Document your discussion with family members, caretakers and with consultants  Document social determinants of health affecting pt's care  Document your decision making why or why not admission,  treatments were needed:32947:::1}   Final diagnoses:  None    ED Discharge Orders     None

## 2024-05-08 NOTE — H&P (Signed)
 History and Physical    Patient: Cody Preston FMW:[id number] DOB: 1966/05/05 DOA: 05/08/2024 DOS: the patient was seen and examined on 05/08/2024 PCP: Leigh Lung, MD  Patient coming from: Gas station  Chief Complaint: No chief complaint on file.  HPI: Cody Preston is a 58 y.o. male with medical history significant for NSTEMI with stent placement in 2021, hypertension, hyperlipidemia, GERD and chronic low back pain stemming from history of a fractured back in the distant past.  He presents after tripping on a mat at the gas station.  He says he was in his usual state of health with no chest pain or dizziness no fevers or chills.  He just tripped on a corner of the mat. In the emergency department his x-ray reveals a left femoral neck fracture.  Dr. Sharl of orthopedic surgery was consulted and plans to take him to the OR in the a.m.   Review of Systems: As mentioned in the history of present illness. All other systems reviewed and are negative. Past Medical History:  Diagnosis Date   Back pain    Coronary artery disease    Hypercholesteremia    Hyperlipidemia    Primary hypertension 09/27/2022   Past Surgical History:  Procedure Laterality Date   AVGG REMOVAL Right 05/20/2020   Procedure: REMOVAL OF STENT RIGHT ULNAR ARTERY AND PRIMARY REPAIR OF ULNAR ARTERY;  Surgeon: Eliza Lonni RAMAN, MD;  Location: [facility] OR;  Service: Vascular;  Laterality: Right;   BACK SURGERY     BACK SURGERY     CLUB FOOT RELEASE     CORONARY ATHERECTOMY N/A 12/18/2019   Procedure: CORONARY ATHERECTOMY;  Surgeon: Elmira Newman PARAS, MD;  Location: [facility] INVASIVE CV LAB;  Service: Cardiovascular;  Laterality: N/A;   CORONARY BALLOON ANGIOPLASTY N/A 05/20/2020   Procedure: CORONARY BALLOON ANGIOPLASTY;  Surgeon: Ladona Heinz, MD;  Location: [facility] INVASIVE CV LAB;  Service: Cardiovascular;  Laterality: N/A;   CORONARY STENT INTERVENTION  12/18/2019   CORONARY STENT INTERVENTION N/A 12/18/2019   Procedure: CORONARY  STENT INTERVENTION;  Surgeon: Elmira Newman PARAS, MD;  Location: [facility] INVASIVE CV LAB;  Service: Cardiovascular;  Laterality: N/A;   CORONARY STENT INTERVENTION N/A 07/15/2020   Procedure: CORONARY STENT INTERVENTION;  Surgeon: Ladona Heinz, MD;  Location: [facility] INVASIVE CV LAB;  Service: Cardiovascular;  Laterality: N/A;   CORONARY THROMBECTOMY N/A 04/19/2020   Procedure: Coronary Thrombectomy;  Surgeon: Ladona Heinz, MD;  Location: [facility] INVASIVE CV LAB;  Service: Cardiovascular;  Laterality: N/A;   CORONARY ULTRASOUND/IVUS N/A 12/18/2019   Procedure: Intravascular Ultrasound/IVUS;  Surgeon: Elmira Newman PARAS, MD;  Location: [facility] INVASIVE CV LAB;  Service: Cardiovascular;  Laterality: N/A;   CORONARY/GRAFT ACUTE MI REVASCULARIZATION N/A 04/19/2020   Procedure: Coronary/Graft Acute MI Revascularization;  Surgeon: Ladona Heinz, MD;  Location: [facility] INVASIVE CV LAB;  Service: Cardiovascular;  Laterality: N/A;   LEFT HEART CATH AND CORONARY ANGIOGRAPHY N/A 12/18/2019   Procedure: LEFT HEART CATH AND CORONARY ANGIOGRAPHY;  Surgeon: Elmira Newman PARAS, MD;  Location: [facility] INVASIVE CV LAB;  Service: Cardiovascular;  Laterality: N/A;   LEFT HEART CATH AND CORONARY ANGIOGRAPHY N/A 04/19/2020   Procedure: LEFT HEART CATH AND CORONARY ANGIOGRAPHY;  Surgeon: Ladona Heinz, MD;  Location: [facility] INVASIVE CV LAB;  Service: Cardiovascular;  Laterality: N/A;   SHUNT REPLACEMENT     at 36 months of age   4 REMOVAL Right 05/20/2020   REMOVAL CORONARY STENT FROM RIGHT ULNAR ARTER   Social History:  reports that he has never smoked.  He has never used smokeless tobacco. He reports current alcohol use of about 1.0 standard drink of alcohol per week. He reports that he does not use drugs.  Allergies  Allergen Reactions   Bee Venom Swelling and Other (See Comments)    Finger became blue and he had to be rushed to a hospital   Wasp Venom Swelling and Other (See Comments)    Skin turns black and blue at site stung, near syncope     Family History  Problem Relation Age of Onset   Heart attack Mother    Arthritis Mother    Lung cancer Mother    Alzheimer's disease Father    Allergies Sister    Arthritis Sister    Cancer Sister    Heart attack Brother    Hyperlipidemia Brother     Prior to Admission medications   Medication Sig Start Date End Date Taking? Authorizing Provider  acetaminophen  (TYLENOL ) 325 MG tablet Take 650 mg by mouth every 6 (six) hours as needed.   Yes [provider]  aspirin  81 MG chewable tablet Chew 1 tablet (81 mg total) by mouth daily. 04/22/20  Yes Patwardhan, Manish J, MD  atorvastatin (LIPITOR) 20 MG tablet Take 20 mg by mouth daily. 04/10/24  Yes [provider]  losartan  (COZAAR ) 25 MG tablet Take 1 tablet (25 mg total) by mouth every evening. 05/01/24  Yes Ladona Heinz, MD  metoprolol  succinate (TOPROL -XL) 50 MG 24 hr tablet Take 1 tablet (50 mg total) by mouth daily. Take with or immediately following a meal. 05/01/24  Yes Ladona Heinz, MD  nitroGLYCERIN  (NITROSTAT ) 0.4 MG SL tablet Place 1 tablet (0.4 mg total) under the tongue every 5 (five) minutes as needed for chest pain. 05/01/24  Yes Ladona Heinz, MD  pantoprazole  (PROTONIX ) 40 MG tablet Take 40 mg by mouth daily. 04/10/24  Yes [provider]  rosuvastatin  (CRESTOR ) 20 MG tablet Take 1 tablet (20 mg total) by mouth daily. 05/01/24  Yes Ladona Heinz, MD  traMADol  (ULTRAM ) 50 MG tablet Take 50 mg by mouth 3 (three) times daily as needed (for pain).  05/23/20  Yes [provider]    Physical Exam: Vitals:   05/08/24 1530 05/08/24 1605 05/08/24 1830 05/08/24 1936  BP: 121/83 115/74 126/82   Pulse: 88 84 88   Resp:  16 (!) 22   Temp:    97.9 F (36.6 C)  TempSrc:    Oral  SpO2: 99% 100% 96%   Weight:      Height:       Physical Exam:  General: No acute distress, appears much older than stated age HEENT: Normocephalic, atraumatic, PERRL Cardiovascular: Normal rate and rhythm. Distal pulses  intact. Pulmonary: Normal pulmonary effort, normal breath sounds Gastrointestinal: Nondistended abdomen, soft, non-tender, normoactive bowel sounds Musculoskeletal:Right club foot, no lower ext edema Skin: Skin is warm and dry. Neuro: strength exam deferred, AAOx3. PSYCH: Attentive and cooperative  Data Reviewed:  Results for orders placed or performed during the hospital encounter of 05/08/24 (from the past 24 hours)  Basic metabolic panel     Status: Abnormal   Collection Time: 05/08/24  4:09 PM  Result Value Ref Range   Sodium 131 (L) 135 - 145 mmol/L   Potassium 4.7 3.5 - 5.1 mmol/L   Chloride 99 98 - 111 mmol/L   CO2 25 22 - 32 mmol/L   Glucose, Bld 109 (H) 70 - 99 mg/dL   BUN 11 6 - 20 mg/dL  Creatinine, Ser 0.94 0.61 - 1.24 mg/dL   Calcium  8.5 (L) 8.9 - 10.3 mg/dL   GFR, Estimated >39 >39 mL/min   Anion gap 7 5 - 15  Ethanol     Status: None   Collection Time: 05/08/24  4:09 PM  Result Value Ref Range   Alcohol, Ethyl (B) <15 <15 mg/dL  CBC with Differential     Status: None   Collection Time: 05/08/24  4:09 PM  Result Value Ref Range   WBC 8.2 4.0 - 10.5 K/uL   RBC 4.85 4.22 - 5.81 MIL/uL   Hemoglobin 13.2 13.0 - 17.0 g/dL   HCT 59.4 60.9 - 47.9 %   MCV 83.5 80.0 - 100.0 fL   MCH 27.2 26.0 - 34.0 pg   MCHC 32.6 30.0 - 36.0 g/dL   RDW 85.3 88.4 - 84.4 %   Platelets 356 150 - 400 K/uL   nRBC 0.0 0.0 - 0.2 %   Neutrophils Relative % 78 %   Neutro Abs 6.5 1.7 - 7.7 K/uL   Lymphocytes Relative 10 %   Lymphs Abs 0.8 0.7 - 4.0 K/uL   Monocytes Relative 9 %   Monocytes Absolute 0.7 0.1 - 1.0 K/uL   Eosinophils Relative 1 %   Eosinophils Absolute 0.1 0.0 - 0.5 K/uL   Basophils Relative 1 %   Basophils Absolute 0.1 0.0 - 0.1 K/uL   Immature Granulocytes 1 %   Abs Immature Granulocytes 0.04 0.00 - 0.07 K/uL     Assessment and Plan: Left hip fracture - - N.p.o. after midnight - Pain control - Management per Dr Sharl - Start DVT prophylaxis post op  2. CAD  -the patient denies chest pain or shortness of breath.  He has no sign of decompensated heart failure. The patient actually saw his cardiologist Dr. Ladona a month ago for cardiology clearance for a testicular procedure that is scheduled next month.  Dr. Ladona wanted a repeat echo but otherwise felt the patient was medically optimized  without any further cardiac testing needed for surgery.  3.  Hypertension will continue the patient's beta-blocker and losartan   4.  Hyperlipidemia we will continue the patient's statins.   Advance Care Planning:   Code Status: Full Code   Consults: none  Family Communication: none  Severity of Illness: The appropriate patient status for this patient is INPATIENT. Inpatient status is judged to be reasonable and necessary in order to provide the required intensity of service to ensure the patient's safety. The patient's presenting symptoms, physical exam findings, and initial radiographic and laboratory data in the context of their chronic comorbidities is felt to place them at high risk for further clinical deterioration. Furthermore, it is not anticipated that the patient will be medically stable for discharge from the hospital within 2 midnights of admission.   * I certify that at the point of admission it is my clinical judgment that the patient will require inpatient hospital care spanning beyond 2 midnights from the point of admission due to high intensity of service, high risk for further deterioration and high frequency of surveillance required.*  Author: ARTHEA CHILD, MD 05/08/2024 7:45 PM  For on call review [email or web address].

## 2024-05-08 NOTE — ED Notes (Signed)
 Patient transported to X-ray

## 2024-05-08 NOTE — Consult Note (Signed)
 Cardiology Consultation   Patient ID: Cody Preston MRN: [id number]; DOB: 03/25/1966  Admit date: 05/08/2024 Date of Consult: 05/08/2024  PCP:  Leigh Lung, MD   [facility] Providers Cardiologist:  Gordy Bergamo, MD        Patient Profile: Cody Preston is a 58 y.o. male with a hx of CAD status post stent in the LAD in 2021, hypertension, GERD, chronic back pain, hx of DVT and pulmonary embolism in 1998 who is being seen 05/08/2024 for the evaluation for preoperative evaluation for plan for hip surgery at the request of Dr. Arthea.  He follows with Dr. Bergamo and was last seen by him in 04/02/2024 at this time he reported some chest pain and nitroglycerin  was prescribed. He was then cleared for urological surgery.   History of Present Illness: [name] tells me that he was in his usual state of health when he was at the gas station. Unfortunately he tripped on a mat, then slipped and fell. Landing on his hip. He was brought in to the ED for evaluation , while in the ED he was noted to have left femoral neck fracture.   He requested to be seen by cardiology.   Seen and examined at his bedside in ED bed 8, he offers no complaints. Denies chest pain, shortness of breath. Admits to some back pain and hip pain.   Past Medical History:  Diagnosis Date   Back pain    Coronary artery disease    Hypercholesteremia    Hyperlipidemia    Primary hypertension 09/27/2022    Past Surgical History:  Procedure Laterality Date   AVGG REMOVAL Right 05/20/2020   Procedure: REMOVAL OF STENT RIGHT ULNAR ARTERY AND PRIMARY REPAIR OF ULNAR ARTERY;  Surgeon: Eliza Lonni RAMAN, MD;  Location: [facility] OR;  Service: Vascular;  Laterality: Right;   BACK SURGERY     BACK SURGERY     CLUB FOOT RELEASE     CORONARY ATHERECTOMY N/A 12/18/2019   Procedure: CORONARY ATHERECTOMY;  Surgeon: Elmira Newman PARAS, MD;  Location: [facility] INVASIVE CV LAB;  Service: Cardiovascular;  Laterality: N/A;   CORONARY BALLOON  ANGIOPLASTY N/A 05/20/2020   Procedure: CORONARY BALLOON ANGIOPLASTY;  Surgeon: Bergamo Gordy, MD;  Location: [facility] INVASIVE CV LAB;  Service: Cardiovascular;  Laterality: N/A;   CORONARY STENT INTERVENTION  12/18/2019   CORONARY STENT INTERVENTION N/A 12/18/2019   Procedure: CORONARY STENT INTERVENTION;  Surgeon: Elmira Newman PARAS, MD;  Location: [facility] INVASIVE CV LAB;  Service: Cardiovascular;  Laterality: N/A;   CORONARY STENT INTERVENTION N/A 07/15/2020   Procedure: CORONARY STENT INTERVENTION;  Surgeon: Bergamo Gordy, MD;  Location: [facility] INVASIVE CV LAB;  Service: Cardiovascular;  Laterality: N/A;   CORONARY THROMBECTOMY N/A 04/19/2020   Procedure: Coronary Thrombectomy;  Surgeon: Bergamo Gordy, MD;  Location: [facility] INVASIVE CV LAB;  Service: Cardiovascular;  Laterality: N/A;   CORONARY ULTRASOUND/IVUS N/A 12/18/2019   Procedure: Intravascular Ultrasound/IVUS;  Surgeon: Elmira Newman PARAS, MD;  Location: [facility] INVASIVE CV LAB;  Service: Cardiovascular;  Laterality: N/A;   CORONARY/GRAFT ACUTE MI REVASCULARIZATION N/A 04/19/2020   Procedure: Coronary/Graft Acute MI Revascularization;  Surgeon: Bergamo Gordy, MD;  Location: [facility] INVASIVE CV LAB;  Service: Cardiovascular;  Laterality: N/A;   LEFT HEART CATH AND CORONARY ANGIOGRAPHY N/A 12/18/2019   Procedure: LEFT HEART CATH AND CORONARY ANGIOGRAPHY;  Surgeon: Elmira Newman PARAS, MD;  Location: [facility] INVASIVE CV LAB;  Service: Cardiovascular;  Laterality: N/A;   LEFT HEART CATH AND CORONARY ANGIOGRAPHY N/A 04/19/2020  Procedure: LEFT HEART CATH AND CORONARY ANGIOGRAPHY;  Surgeon: Ladona Heinz, MD;  Location: [facility] INVASIVE CV LAB;  Service: Cardiovascular;  Laterality: N/A;   SHUNT REPLACEMENT     at 77 months of age   23 REMOVAL Right 05/20/2020   REMOVAL CORONARY STENT FROM RIGHT ULNAR ARTER      Scheduled Meds:  [START ON 05/09/2024] aspirin   81 mg Oral Daily   [START ON 05/09/2024] metoprolol  succinate  50 mg Oral Daily   [START ON 05/09/2024] pantoprazole   40 mg Oral  Daily   [START ON 05/09/2024] rosuvastatin   20 mg Oral Daily   Continuous Infusions:  PRN Meds: acetaminophen  **OR** acetaminophen , HYDROmorphone  (DILAUDID ) injection, ondansetron  **OR** ondansetron  (ZOFRAN ) IV, senna-docusate  Allergies:    Allergies  Allergen Reactions   Bee Venom Swelling and Other (See Comments)    Finger became blue and he had to be rushed to a hospital   Wasp Venom Swelling and Other (See Comments)    Skin turns black and blue at site stung, near syncope    Social History:   Social History   Socioeconomic History   Marital status: Single    Spouse name: Not on file   Number of children: 0   Years of education: Not on file   Highest education level: Not on file  Occupational History   Occupation: Retired  Tobacco Use   Smoking status: Never   Smokeless tobacco: Never  Vaping Use   Vaping status: Never Used  Substance and Sexual Activity   Alcohol use: Yes    Alcohol/week: 1.0 standard drink of alcohol    Types: 1 Shots of liquor per week    Comment: occasionally   Drug use: No   Sexual activity: Not on file  Other Topics Concern   Not on file  Social History Narrative   Not on file   Social Drivers of Health   Financial Resource Strain: Not on file  Food Insecurity: Not on file  Transportation Needs: Not on file  Physical Activity: Not on file  Stress: Not on file  Social Connections: Not on file  Intimate Partner Violence: Not on file    Family History:    Family History  Problem Relation Age of Onset   Heart attack Mother    Arthritis Mother    Lung cancer Mother    Alzheimer's disease Father    Allergies Sister    Arthritis Sister    Cancer Sister    Heart attack Brother    Hyperlipidemia Brother      ROS:  Please see the history of present illness.  Back pain All other ROS reviewed and negative.     Physical Exam/Data: Vitals:   05/08/24 1530 05/08/24 1605 05/08/24 1830 05/08/24 1936  BP: 121/83 115/74 126/82    Pulse: 88 84 88   Resp:  16 (!) 22   Temp:    97.9 F (36.6 C)  TempSrc:    Oral  SpO2: 99% 100% 96%   Weight:      Height:       No intake or output data in the 24 hours ending 05/08/24 2050    05/08/2024    3:11 PM 04/02/2024    2:26 PM 07/13/2023   12:56 PM  Last 3 Weights  Weight (lbs) 152 lb 152 lb 157 lb  Weight (kg) 68.947 kg 68.947 kg 71.215 kg     Body mass index is 24.53 kg/m.  General:  Well nourished, well developed, in  no acute distress HEENT: normal Neck: no JVD Vascular: No carotid bruits; Distal pulses 2+ bilaterally Cardiac:  normal S1, S2; RRR; no murmur  Lungs:  clear to auscultation bilaterally, no wheezing, rhonchi or rales  Abd: soft, nontender, no hepatomegaly  Ext: no edema Musculoskeletal:  right foot club Skin: warm and dry  Neuro:  CNs 2-12 intact, no focal abnormalities noted Psych:  Normal affect   EKG:  The EKG was personally reviewed and demonstrates:   Telemetry:  Telemetry was personally reviewed and demonstrates:    Relevant CV Studies:   Laboratory Data: High Sensitivity Troponin:  No results for input(s): TROPONINIHS in the last 720 hours.   Chemistry Recent Labs  Lab 05/08/24 1609  NA 131*  K 4.7  CL 99  CO2 25  GLUCOSE 109*  BUN 11  CREATININE 0.94  CALCIUM  8.5*  GFRNONAA >60  ANIONGAP 7    No results for input(s): PROT, ALBUMIN, AST, ALT, ALKPHOS, BILITOT in the last 168 hours. Lipids No results for input(s): CHOL, TRIG, HDL, LABVLDL, LDLCALC, CHOLHDL in the last 168 hours.  Hematology Recent Labs  Lab 05/08/24 1609  WBC 8.2  RBC 4.85  HGB 13.2  HCT 40.5  MCV 83.5  MCH 27.2  MCHC 32.6  RDW 14.6  PLT 356   Thyroid  No results for input(s): TSH, FREET4 in the last 168 hours.  BNPNo results for input(s): BNP, PROBNP in the last 168 hours.  DDimer No results for input(s): DDIMER in the last 168 hours.  Radiology/Studies:  DG Femur Min 2 Views Left Result Date:  05/08/2024 CLINICAL DATA:  Fall EXAM: LEFT FEMUR 2 VIEWS COMPARISON:  None Available. FINDINGS: There is an acute left femoral neck fracture with superolateral displacement of the distal fracture fragment. There is no dislocation. Peripheral vascular calcifications are present. Soft tissues are otherwise within normal limits. IMPRESSION: Acute left femoral neck fracture. Electronically Signed   By: Greig Pique M.D.   On: 05/08/2024 17:21     Assessment and Plan: CAD - no anginal symptoms at this time. Continue current regimen. Cont with asa and Lipitor Hx of ischemic cardiomyopathy echo 2021 Ef 40-45% - no signs of volume overload.  Hypertension - blood pressure at target, continue losartan  and  Hyperlipidemia - LDL goal < 55 , continue Lipitor  Prediabetes, Hba1c in 2021 5.9.  Left hip fracture - plan for surgery in the am.  He is stable from a cardiovascular standpoint he can proceed with his much need surgery. He is at intermediate risk for major adverse cardiovascular event. Please continue his beta blocker with no interruption.    Risk Assessment/Risk Scores:              For questions or updates, please contact [facility] Please consult [email or web address] for contact info under    Signed, Indy Prestwood, DO  05/08/2024 8:50 PM

## 2024-05-08 NOTE — Progress Notes (Signed)
 Will need operative management left hip fracture.  Consult to come in am.  Npo at [facility], for possible surgery tomorrow.

## 2024-05-08 NOTE — ED Triage Notes (Signed)
 Pt bib ems from gas station; tripped and fell , landed on L side; c/o L hip pain, radiating to L knee; worse with movement; limited movement of LLE; possible hip dislocation; 150 mcg fentanyl  given pta; 18 ga lfa; did not hit head, no loc, no thinners; 120/73, hr 90, rr 16, 97% RA; last dose fentanyl  at 1449; pt lying on L side, states cannot lie on back

## 2024-05-09 ENCOUNTER — Inpatient Hospital Stay (HOSPITAL_COMMUNITY)

## 2024-05-09 ENCOUNTER — Encounter (HOSPITAL_COMMUNITY): Payer: Self-pay | Admitting: Internal Medicine

## 2024-05-09 ENCOUNTER — Other Ambulatory Visit: Payer: Self-pay

## 2024-05-09 ENCOUNTER — Encounter (HOSPITAL_COMMUNITY)
Admission: EM | Disposition: A | Discharge status: Skilled Nursing Facility | Payer: Self-pay | Source: Home / Self Care | Attending: Internal Medicine

## 2024-05-09 DIAGNOSIS — I2510 Atherosclerotic heart disease of native coronary artery without angina pectoris: Secondary | ICD-10-CM | POA: Diagnosis not present

## 2024-05-09 DIAGNOSIS — S72002A Fracture of unspecified part of neck of left femur, initial encounter for closed fracture: Secondary | ICD-10-CM

## 2024-05-09 DIAGNOSIS — I10 Essential (primary) hypertension: Secondary | ICD-10-CM | POA: Diagnosis not present

## 2024-05-09 DIAGNOSIS — I25119 Atherosclerotic heart disease of native coronary artery with unspecified angina pectoris: Secondary | ICD-10-CM | POA: Diagnosis not present

## 2024-05-09 DIAGNOSIS — I255 Ischemic cardiomyopathy: Secondary | ICD-10-CM | POA: Diagnosis not present

## 2024-05-09 HISTORY — PX: INTRAMEDULLARY (IM) NAIL INTERTROCHANTERIC: SHX5875

## 2024-05-09 LAB — BASIC METABOLIC PANEL WITH GFR
Anion gap: 9 (ref 5–15)
BUN: 11 mg/dL (ref 6–20)
CO2: 24 mmol/L (ref 22–32)
Calcium: 8.7 mg/dL — ABNORMAL LOW (ref 8.9–10.3)
Chloride: 95 mmol/L — ABNORMAL LOW (ref 98–111)
Creatinine, Ser: 0.8 mg/dL (ref 0.61–1.24)
GFR, Estimated: >60 mL/min (ref >60)
Glucose, Bld: 121 mg/dL — ABNORMAL HIGH (ref 70–99)
Potassium: 4 mmol/L (ref 3.5–5.1)
Sodium: 128 mmol/L — ABNORMAL LOW (ref 135–145)

## 2024-05-09 LAB — ECHOCARDIOGRAM COMPLETE
Height: 66 in
S' Lateral: 2.5 cm
Single Plane A2C EF: 45.8 %
Weight: 2432 [oz_av]

## 2024-05-09 LAB — CBC
HCT: 38.9 % — ABNORMAL LOW (ref 39.0–52.0)
Hemoglobin: 12.7 g/dL — ABNORMAL LOW (ref 13.0–17.0)
MCH: 26.8 pg (ref 26.0–34.0)
MCHC: 32.6 g/dL (ref 30.0–36.0)
MCV: 82.2 fL (ref 80.0–100.0)
Platelets: 346 K/uL (ref 150–400)
RBC: 4.73 MIL/uL (ref 4.22–5.81)
RDW: 14.4 % (ref 11.5–15.5)
WBC: 12.1 K/uL — ABNORMAL HIGH (ref 4.0–10.5)
nRBC: 0 % (ref 0.0–0.2)

## 2024-05-09 LAB — HIV ANTIBODY (ROUTINE TESTING W REFLEX): HIV Screen 4th Generation wRfx: NONREACTIVE

## 2024-05-09 LAB — MRSA NEXT GEN BY PCR, NASAL: MRSA by PCR Next Gen: DETECTED — AB

## 2024-05-09 SURGERY — FIXATION, FRACTURE, INTERTROCHANTERIC, WITH INTRAMEDULLARY ROD
Anesthesia: General | Laterality: Left

## 2024-05-09 MED ORDER — MIDAZOLAM HCL 2 MG/2ML IJ SOLN
INTRAMUSCULAR | Status: AC
  Filled 2024-05-09: qty 2

## 2024-05-09 MED ORDER — DOCUSATE SODIUM 100 MG PO CAPS
100.0000 mg | ORAL_CAPSULE | Freq: Two times a day (BID) | ORAL | Status: DC
  Administered 2024-05-09 – 2024-05-17 (×14): 100 mg via ORAL
  Filled 2024-05-09 (×15): qty 1

## 2024-05-09 MED ORDER — MIDAZOLAM HCL 2 MG/2ML IJ SOLN
INTRAMUSCULAR | Status: DC | PRN
  Administered 2024-05-09: 1 mg via INTRAVENOUS

## 2024-05-09 MED ORDER — ENOXAPARIN SODIUM 40 MG/0.4ML IJ SOSY
40.0000 mg | PREFILLED_SYRINGE | INTRAMUSCULAR | Status: DC
  Administered 2024-05-10 – 2024-05-17 (×8): 40 mg via SUBCUTANEOUS
  Filled 2024-05-09 (×8): qty 0.4

## 2024-05-09 MED ORDER — MENTHOL 3 MG MT LOZG
1.0000 | LOZENGE | OROMUCOSAL | Status: DC | PRN
Start: 2024-05-09 — End: 2024-05-17

## 2024-05-09 MED ORDER — DEXAMETHASONE SODIUM PHOSPHATE 10 MG/ML IJ SOLN
INTRAMUSCULAR | Status: AC
Start: 2024-05-09 — End: 2024-05-09
  Filled 2024-05-09: qty 1

## 2024-05-09 MED ORDER — ACETAMINOPHEN 500 MG PO TABS: 1000.0000 mg | ORAL_TABLET | Freq: Once | ORAL | Status: DC

## 2024-05-09 MED ORDER — CEFAZOLIN SODIUM-DEXTROSE 2-4 GM/100ML-% IV SOLN
2.0000 g | Freq: Four times a day (QID) | INTRAVENOUS | Status: AC
  Administered 2024-05-09 – 2024-05-10 (×2): 2 g via INTRAVENOUS
  Filled 2024-05-09 (×2): qty 100

## 2024-05-09 MED ORDER — FENTANYL CITRATE (PF) 100 MCG/2ML IJ SOLN
50.0000 ug | INTRAMUSCULAR | Status: DC | PRN
  Administered 2024-05-09: 50 ug via INTRAVENOUS
  Filled 2024-05-09: qty 2

## 2024-05-09 MED ORDER — DEXAMETHASONE SODIUM PHOSPHATE 10 MG/ML IJ SOLN
INTRAMUSCULAR | Status: DC | PRN
  Administered 2024-05-09: 10 mg via INTRAVENOUS

## 2024-05-09 MED ORDER — PHENYLEPHRINE 80 MCG/ML (10ML) SYRINGE FOR IV PUSH (FOR BLOOD PRESSURE SUPPORT)
PREFILLED_SYRINGE | INTRAVENOUS | Status: DC | PRN
  Administered 2024-05-09: 80 ug via INTRAVENOUS

## 2024-05-09 MED ORDER — CEFAZOLIN SODIUM-DEXTROSE 2-4 GM/100ML-% IV SOLN
2.0000 g | INTRAVENOUS | Status: AC
  Administered 2024-05-09: 2 g via INTRAVENOUS
  Filled 2024-05-09: qty 100

## 2024-05-09 MED ORDER — LIDOCAINE 2% (20 MG/ML) 5 ML SYRINGE
INTRAMUSCULAR | Status: AC
  Filled 2024-05-09: qty 5

## 2024-05-09 MED ORDER — FENTANYL CITRATE (PF) 100 MCG/2ML IJ SOLN
25.0000 ug | INTRAMUSCULAR | Status: DC | PRN
  Administered 2024-05-09 (×2): 50 ug via INTRAVENOUS

## 2024-05-09 MED ORDER — MUPIROCIN 2 % EX OINT
TOPICAL_OINTMENT | Freq: Two times a day (BID) | CUTANEOUS | Status: DC
  Administered 2024-05-10 (×2): 1 via NASAL
  Filled 2024-05-09 (×5): qty 22

## 2024-05-09 MED ORDER — ONDANSETRON HCL 4 MG/2ML IJ SOLN
INTRAMUSCULAR | Status: DC | PRN
  Administered 2024-05-09: 4 mg via INTRAVENOUS

## 2024-05-09 MED ORDER — ACETAMINOPHEN 10 MG/ML IV SOLN
INTRAVENOUS | Status: DC | PRN
  Administered 2024-05-09: 1000 mg via INTRAVENOUS

## 2024-05-09 MED ORDER — OXYCODONE HCL 5 MG/5ML PO SOLN: 5.0000 mg | Freq: Once | ORAL | Status: AC | PRN

## 2024-05-09 MED ORDER — CHLORHEXIDINE GLUCONATE 0.12 % MT SOLN: 15.0000 mL | Freq: Once | OROMUCOSAL | Status: AC

## 2024-05-09 MED ORDER — ONDANSETRON HCL 4 MG/2ML IJ SOLN
INTRAMUSCULAR | Status: AC
  Filled 2024-05-09: qty 2

## 2024-05-09 MED ORDER — 0.9 % SODIUM CHLORIDE (POUR BTL) OPTIME
TOPICAL | Status: DC | PRN
  Administered 2024-05-09: 1000 mL

## 2024-05-09 MED ORDER — ORAL CARE MOUTH RINSE: 15.0000 mL | Freq: Once | OROMUCOSAL | Status: AC

## 2024-05-09 MED ORDER — PROPOFOL 10 MG/ML IV BOLUS
INTRAVENOUS | Status: DC | PRN
  Administered 2024-05-09: 100 mg via INTRAVENOUS

## 2024-05-09 MED ORDER — OXYCODONE HCL 5 MG PO TABS
5.0000 mg | ORAL_TABLET | Freq: Once | ORAL | Status: AC | PRN
  Administered 2024-05-09: 5 mg via ORAL

## 2024-05-09 MED ORDER — LACTATED RINGERS IV SOLN: INTRAVENOUS | Status: DC

## 2024-05-09 MED ORDER — PHENYLEPHRINE HCL-NACL 20-0.9 MG/250ML-% IV SOLN
INTRAVENOUS | Status: DC | PRN
  Administered 2024-05-09: 40 ug/min via INTRAVENOUS

## 2024-05-09 MED ORDER — LIDOCAINE 2% (20 MG/ML) 5 ML SYRINGE
INTRAMUSCULAR | Status: DC | PRN
  Administered 2024-05-09: 60 mg via INTRAVENOUS

## 2024-05-09 MED ORDER — PHENOL 1.4 % MT LIQD: 1.0000 | OROMUCOSAL | Status: DC | PRN

## 2024-05-09 MED ORDER — ROCURONIUM BROMIDE 10 MG/ML (PF) SYRINGE
PREFILLED_SYRINGE | INTRAVENOUS | Status: DC | PRN
  Administered 2024-05-09: 50 mg via INTRAVENOUS

## 2024-05-09 MED ORDER — OXYCODONE HCL 5 MG PO TABS
ORAL_TABLET | ORAL | Status: AC
Start: 2024-05-09 — End: 2024-05-09
  Filled 2024-05-09: qty 1

## 2024-05-09 MED ORDER — POVIDONE-IODINE 10 % EX SWAB
2.0000 | Freq: Once | CUTANEOUS | Status: AC
  Administered 2024-05-09: 2 via TOPICAL

## 2024-05-09 MED ORDER — SUGAMMADEX SODIUM 200 MG/2ML IV SOLN: INTRAVENOUS | Status: DC | PRN

## 2024-05-09 MED ORDER — FENTANYL CITRATE (PF) 100 MCG/2ML IJ SOLN
INTRAMUSCULAR | Status: AC
  Filled 2024-05-09: qty 2

## 2024-05-09 MED ORDER — FENTANYL CITRATE (PF) 250 MCG/5ML IJ SOLN
INTRAMUSCULAR | Status: AC
  Filled 2024-05-09: qty 5

## 2024-05-09 MED ORDER — CHLORHEXIDINE GLUCONATE 0.12 % MT SOLN
OROMUCOSAL | Status: AC
  Administered 2024-05-09: 15 mL via OROMUCOSAL
  Filled 2024-05-09: qty 15

## 2024-05-09 MED ORDER — ROCURONIUM BROMIDE 10 MG/ML (PF) SYRINGE
PREFILLED_SYRINGE | INTRAVENOUS | Status: AC
  Filled 2024-05-09: qty 10

## 2024-05-09 MED ORDER — DROPERIDOL 2.5 MG/ML IJ SOLN: 0.6250 mg | Freq: Once | INTRAMUSCULAR | Status: DC | PRN

## 2024-05-09 MED ORDER — TRANEXAMIC ACID-NACL 1000-0.7 MG/100ML-% IV SOLN
1000.0000 mg | Freq: Once | INTRAVENOUS | Status: AC
  Administered 2024-05-09: 1000 mg via INTRAVENOUS
  Filled 2024-05-09: qty 100

## 2024-05-09 MED ORDER — HYDROCODONE-ACETAMINOPHEN 7.5-325 MG PO TABS: 1.0000 | ORAL_TABLET | Freq: Four times a day (QID) | ORAL | 0 refills | Status: DC | PRN

## 2024-05-09 MED ORDER — SUGAMMADEX SODIUM 200 MG/2ML IV SOLN
INTRAVENOUS | Status: DC | PRN
  Administered 2024-05-09: 135.8 mg via INTRAVENOUS

## 2024-05-09 MED ORDER — PROPOFOL 10 MG/ML IV BOLUS
INTRAVENOUS | Status: AC
Start: 2024-05-09 — End: 2024-05-09
  Filled 2024-05-09: qty 20

## 2024-05-09 MED ORDER — ACETAMINOPHEN 10 MG/ML IV SOLN
INTRAVENOUS | Status: AC
  Filled 2024-05-09: qty 100

## 2024-05-09 MED ORDER — CHLORHEXIDINE GLUCONATE 4 % EX SOLN
60.0000 mL | Freq: Once | CUTANEOUS | Status: AC
  Administered 2024-05-09: 4 via TOPICAL
  Filled 2024-05-09: qty 60

## 2024-05-09 MED ORDER — FENTANYL CITRATE (PF) 250 MCG/5ML IJ SOLN
INTRAMUSCULAR | Status: DC | PRN
  Administered 2024-05-09 (×3): 50 ug via INTRAVENOUS

## 2024-05-09 SURGICAL SUPPLY — 37 items
ALCOHOL 70% 16 OZ (MISCELLANEOUS) ×2 IMPLANT
BAG COUNTER SPONGE SURGICOUNT (BAG) ×2 IMPLANT
BIT DRILL 4.0X280 (BIT) IMPLANT
BNDG COHESIVE 6X5 TAN ST LF (GAUZE/BANDAGES/DRESSINGS) ×4 IMPLANT
CANISTER SUCTION 3000ML PPV (SUCTIONS) ×2 IMPLANT
COVER PERINEAL POST (MISCELLANEOUS) ×2 IMPLANT
COVER SURGICAL LIGHT HANDLE (MISCELLANEOUS) ×2 IMPLANT
DRAPE C-ARM 42X72 X-RAY (DRAPES) ×2 IMPLANT
DRAPE HALF SHEET 40X57 (DRAPES) IMPLANT
DRAPE INCISE IOBAN 66X45 STRL (DRAPES) ×2 IMPLANT
DRAPE STERI IOBAN 125X83 (DRAPES) ×2 IMPLANT
DRSG ADAPTIC 3X8 NADH LF (GAUZE/BANDAGES/DRESSINGS) ×2 IMPLANT
DURAPREP 26ML APPLICATOR (WOUND CARE) ×2 IMPLANT
ELECT CAUTERY BLADE 6.4 (BLADE) ×2 IMPLANT
ELECTRODE REM PT RTRN 9FT ADLT (ELECTROSURGICAL) ×2 IMPLANT
GAUZE SPONGE 4X4 12PLY STRL (GAUZE/BANDAGES/DRESSINGS) IMPLANT
GAUZE SPONGE 4X4 12PLY STRL LF (GAUZE/BANDAGES/DRESSINGS) ×2 IMPLANT
GLOVE BIO SURGEON STRL SZ7.5 (GLOVE) ×4 IMPLANT
GLOVE BIOGEL PI IND STRL 8 (GLOVE) ×4 IMPLANT
GOWN STRL REUS W/ TWL LRG LVL3 (GOWN DISPOSABLE) ×2 IMPLANT
GOWN STRL REUS W/ TWL XL LVL3 (GOWN DISPOSABLE) ×4 IMPLANT
KIT BASIN OR (CUSTOM PROCEDURE TRAY) ×2 IMPLANT
KIT TURNOVER KIT B (KITS) ×2 IMPLANT
NAIL TROCH SHORT 10X20 125D (Nail) IMPLANT
NS IRRIG 1000ML POUR BTL (IV SOLUTION) ×2 IMPLANT
PACK GENERAL/GYN (CUSTOM PROCEDURE TRAY) ×2 IMPLANT
PAD ARMBOARD POSITIONER FOAM (MISCELLANEOUS) ×4 IMPLANT
PIN GUIDE THRD AR 3.2X330 (PIN) IMPLANT
SCREW CORT CAPTR 5X34 (Screw) IMPLANT
SCREW TELESCOP LAG 10.5X85 (Screw) IMPLANT
STAPLER SKIN PROX 35W (STAPLE) ×2 IMPLANT
SUT MON AB 2-0 CT1 36 (SUTURE) ×2 IMPLANT
SUT VIC AB 2-0 CT1 TAPERPNT 27 (SUTURE) IMPLANT
TOOL ACTIVATION (INSTRUMENTS) IMPLANT
TOWEL GREEN STERILE (TOWEL DISPOSABLE) ×2 IMPLANT
TOWEL GREEN STERILE FF (TOWEL DISPOSABLE) ×2 IMPLANT
WATER STERILE IRR 1000ML POUR (IV SOLUTION) ×2 IMPLANT

## 2024-05-09 NOTE — Progress Notes (Signed)
 PROGRESS NOTE    Cody Preston  FMW:[id number] DOB: June 27, 1966 DOA: 05/08/2024 PCP: Leigh Lung, MD    Brief Narrative:  58 year old with history of non-STEMI, coronary stent in 2021, hypertension, hyperlipidemia, GERD, chronic low back pain presented with mechanical fall and left hip pain.  He was found to have left femoral neck fracture.  Admitted for orthopedic consultation.  Hemodynamically stable in the ER.  Also seen by cardiology.  Subjective: Patient seen in the morning rounds.  Denies any complaints.  Pain is controlled.  Denies chest pain or palpitations.  His heart rate was slightly elevated before receiving the Toprol -XL in the morning. Assessment & Plan:   Left hip fracture: N.p.o. for anticipated ORIF today Adequate oral and IV opiates for pain control Nonweightbearing SCD for DVT prophylaxis Postop management as per surgery  Coronary artery disease: Currently no evidence of recurrence.  Continued on aspirin , beta-blockers and statins.  Seen by cardiology.  Moderate risk for orthopedic surgery but surgery is most needed.  Hypertension: Stable on beta-blockers.  Hyperlipidemia: On statin.   DVT prophylaxis: SCDs   Code Status: Full code Family Communication: None at bedside Disposition Plan: Status is: Inpatient Remains inpatient appropriate because: Inpatient surgery plan     Consultants:  Orthopedics Cardiology  Procedures:  None  Antimicrobials:  None     Objective: Vitals:   05/09/24 0715 05/09/24 0900 05/09/24 1115 05/09/24 1211  BP: (!) 119/92 111/84  116/87  Pulse: (!) 114 (!) 113  (!) 103  Resp: 14 12  17   Temp: 99.5 F (37.5 C)  97.9 F (36.6 C)   TempSrc: Oral  Oral   SpO2: 99% 98%  99%  Weight:      Height:        Intake/Output Summary (Last 24 hours) at 05/09/2024 1311 Last data filed at 05/09/2024 0135 Gross per 24 hour  Intake --  Output 420 ml  Net -420 ml   Filed Weights   05/08/24 1511  Weight: 68.9 kg     Examination:  General exam: Appears calm and comfortable  Respiratory system: Clear to auscultation. Respiratory effort normal. Cardiovascular system: S1 & S2 heard, RRR. No pedal edema. Gastrointestinal system: Abdomen is nondistended, soft and nontender. No organomegaly or masses felt. Normal bowel sounds heard. Central nervous system: Alert and oriented. No focal neurological deficits. Extremities: Symmetric 5 x 5 power. Left hip, no deformity.  Range of motion not checked with known fracture.  Distal neurovascular status intact.    Data Reviewed: I have personally reviewed following labs and imaging studies  CBC: Recent Labs  Lab 05/08/24 1609 05/09/24 0832  WBC 8.2 12.1*  NEUTROABS 6.5  --   HGB 13.2 12.7*  HCT 40.5 38.9*  MCV 83.5 82.2  PLT 356 346   Basic Metabolic Panel: Recent Labs  Lab 05/08/24 1609 05/09/24 0832  NA 131* 128*  K 4.7 4.0  CL 99 95*  CO2 25 24  GLUCOSE 109* 121*  BUN 11 11  CREATININE 0.94 0.80  CALCIUM  8.5* 8.7*   GFR: Estimated Creatinine Clearance: 93 mL/min (by C-G formula based on SCr of 0.8 mg/dL). Liver Function Tests: No results for input(s): AST, ALT, ALKPHOS, BILITOT, PROT, ALBUMIN in the last 168 hours. No results for input(s): LIPASE, AMYLASE in the last 168 hours. No results for input(s): AMMONIA in the last 168 hours. Coagulation Profile: No results for input(s): INR, PROTIME in the last 168 hours. Cardiac Enzymes: No results for input(s): CKTOTAL, CKMB, CKMBINDEX, TROPONINI in  the last 168 hours. BNP (last 3 results) No results for input(s): PROBNP in the last 8760 hours. HbA1C: No results for input(s): HGBA1C in the last 72 hours. CBG: No results for input(s): GLUCAP in the last 168 hours. Lipid Profile: No results for input(s): CHOL, HDL, LDLCALC, TRIG, CHOLHDL, LDLDIRECT in the last 72 hours. Thyroid  Function Tests: No results for input(s): TSH, T4TOTAL,  FREET4, T3FREE, THYROIDAB in the last 72 hours. Anemia Panel: No results for input(s): VITAMINB12, FOLATE, FERRITIN, TIBC, IRON, RETICCTPCT in the last 72 hours. Sepsis Labs: No results for input(s): PROCALCITON, LATICACIDVEN in the last 168 hours.  No results found for this or any previous visit (from the past 240 hours).       Radiology Studies: CT PELVIS WO CONTRAST Result Date: 05/08/2024 CLINICAL DATA:  Clemens.  Left femoral neck fracture. EXAM: CT PELVIS WITHOUT CONTRAST TECHNIQUE: Multidetector CT imaging of the pelvis was performed following the standard protocol without intravenous contrast. RADIATION DOSE REDUCTION: This exam was performed according to the departmental dose-optimization program which includes automated exposure control, adjustment of the mA and/or kV according to patient size and/or use of iterative reconstruction technique. COMPARISON:  Radiographs 05/08/2024 FINDINGS: There is a complex comminuted intertrochanteric fracture of the left hip with significant varus deformity. This fracture also involves the base of the cervical neck but the cervical neck is otherwise intact. The femoral head is normally located. No acetabular fracture. No right hip fracture. The pubic symphysis and SI joints are intact. No acute pelvic fractures. Remote healed right-sided pubic rami fractures. No significant intrapelvic abnormalities are identified. Age advanced vascular disease. Stable large complex right scrotal lesion possible complex hydrocele or hematoma. This was also seen on the prior ultrasound examination January. IMPRESSION: 1. Complex comminuted intertrochanteric fracture of the left hip with significant varus deformity. 2. No acetabular fracture. 3. No right hip fracture. 4. Remote healed right-sided pubic rami fractures. 5. Stable large complex right scrotal lesion possible complex hydrocele or hematoma. This was also seen on the prior ultrasound examination  January. Electronically Signed   By: MYRTIS Stammer M.D.   On: 05/08/2024 22:51   DG Femur Min 2 Views Left Result Date: 05/08/2024 CLINICAL DATA:  Fall EXAM: LEFT FEMUR 2 VIEWS COMPARISON:  None Available. FINDINGS: There is an acute left femoral neck fracture with superolateral displacement of the distal fracture fragment. There is no dislocation. Peripheral vascular calcifications are present. Soft tissues are otherwise within normal limits. IMPRESSION: Acute left femoral neck fracture. Electronically Signed   By: Greig Pique M.D.   On: 05/08/2024 17:21        Scheduled Meds:  aspirin   81 mg Oral Daily   chlorhexidine   60 mL Topical Once   metoprolol  succinate  50 mg Oral Daily   pantoprazole   40 mg Oral Daily   povidone-iodine   2 Application Topical Once   rosuvastatin   20 mg Oral Daily   Continuous Infusions:   ceFAZolin  (ANCEF ) IV       LOS: 1 day    Time spent: 52 minutes    Renato Applebaum, MD [facility]

## 2024-05-09 NOTE — ED Notes (Signed)
 PT HAS DELIBERATELY TAKEN OFF HIS CARDIAC LEADS, PULSE OX, AND B/P CUFF 6 TIMES DURING THIS SHIFT. PT IS ALERT AND ORIENTED BUT CONTINUES WITH THIS INAPPROPRIATE BEHAVIOR STATING EACH TIME AFTER HE PULLS HIS LEADS OFF, WELL, I GUESS YOU HAVE TO COME BACK AND PUT IT ON ME AGAIN...HEHEHE. PT FURTHER STATED, I THINK I NEED YOU TO HELP ME WITH MY URINAL...HEHEHEHE, DESPITE THE FACT THAT HE HAS FULL USE OF BOTH HIS HANDS AND USED THE URINAL INDEPENDENTLY THE ENTIRE PREVIOUS SHIFT. AT THIS POINT THIS NURSE DID NOT ATTACH PT TO THE MONITOR AGAIN.

## 2024-05-09 NOTE — Anesthesia Postprocedure Evaluation (Signed)
 Anesthesia Post Note  Patient: Cody Preston  Procedure(s) Performed: FIXATION, FRACTURE, INTERTROCHANTERIC, WITH INTRAMEDULLARY ROD (Left)     Patient location during evaluation: PACU Anesthesia Type: General Level of consciousness: oriented, patient cooperative and sedated Pain management: pain level controlled Vital Signs Assessment: post-procedure vital signs reviewed and stable Respiratory status: spontaneous breathing, nonlabored ventilation and respiratory function stable Cardiovascular status: blood pressure returned to baseline and stable Postop Assessment: no apparent nausea or vomiting Anesthetic complications: no   No notable events documented.  Last Vitals:  Vitals:   05/09/24 1940 05/09/24 1955  BP: (!) 114/90 (!) 107/90  Pulse: (!) 105 (!) 106  Resp: 16 15  Temp: 36.7 C 36.6 C  SpO2: 97% 98%    Last Pain:  Vitals:   05/09/24 1955  TempSrc: Oral  PainSc:                  Casi Westerfeld,E. Janille Draughon

## 2024-05-09 NOTE — Anesthesia Preprocedure Evaluation (Addendum)
 Anesthesia Evaluation  Patient identified by MRN, date of birth, ID band Patient awake    Reviewed: Allergy & Precautions, NPO status , Patient's Chart, lab work & pertinent test results, reviewed documented beta blocker date and time   History of Anesthesia Complications Negative for: history of anesthetic complications  Airway Mallampati: III  TM Distance: >3 FB Neck ROM: Full    Dental  (+) Chipped, Missing,    Pulmonary neg pulmonary ROS   Pulmonary exam normal        Cardiovascular hypertension, Pt. on medications and Pt. on home beta blockers + angina (last NTG 1 month ago)  + CAD, + Past MI and + Cardiac Stents (2021)  Normal cardiovascular exam  TTE 05/09/24: EF 50-55%, global hypokinesis, valves ok   Neuro/Psych negative neurological ROS     GI/Hepatic Neg liver ROS,GERD  Medicated and Controlled,,  Endo/Other  negative endocrine ROS    Renal/GU Na 128     Musculoskeletal left femoral neck fracture   Abdominal   Peds  Hematology  (+) Blood dyscrasia (Hgb 12.7), anemia   Anesthesia Other Findings Day of surgery medications reviewed with patient.  Reproductive/Obstetrics                              Anesthesia Physical Anesthesia Plan  ASA: 3  Anesthesia Plan: General   Post-op Pain Management: Ofirmev  IV (intra-op)*   Induction: Intravenous  PONV Risk Score and Plan: 2 and Treatment may vary due to age or medical condition, Ondansetron , Dexamethasone  and Midazolam   Airway Management Planned: Oral ETT  Additional Equipment: None  Intra-op Plan:   Post-operative Plan: Extubation in OR  Informed Consent: I have reviewed the patients History and Physical, chart, labs and discussed the procedure including the risks, benefits and alternatives for the proposed anesthesia with the patient or authorized representative who has indicated his/her understanding and acceptance.      Dental advisory given  Plan Discussed with: CRNA  Anesthesia Plan Comments:          Anesthesia Quick Evaluation

## 2024-05-09 NOTE — Discharge Instructions (Signed)
 Orthopedic surgery discharge instructions:  -Maintain postoperative bandage until follow-up in 2 weeks.  You may shower with this in place but do not submerge underwater.  - Okay to bear full weight to the Left lower extremity as tolerated.  - Follow-up with Dr. Sharl in 2 weeks for wound check and staple removal.

## 2024-05-09 NOTE — Transfer of Care (Signed)
 Immediate Anesthesia Transfer of Care Note  Patient: Cody Preston  Procedure(s) Performed: FIXATION, FRACTURE, INTERTROCHANTERIC, WITH INTRAMEDULLARY ROD (Left)  Patient Location: PACU  Anesthesia Type:General  Level of Consciousness: awake, alert , and oriented  Airway & Oxygen Therapy: Patient Spontanous Breathing  Post-op Assessment: Report given to RN, Post -op Vital signs reviewed and stable, and Patient moving all extremities  Post vital signs: Reviewed and stable  Last Vitals:  Vitals Value Taken Time  BP 143/95 05/09/24 18:47  Temp 37.1 C 05/09/24 18:47  Pulse 101 05/09/24 18:53  Resp 17 05/09/24 18:53  SpO2 100 % 05/09/24 18:53  Vitals shown include unfiled device data.  Last Pain:  Vitals:   05/09/24 1725  TempSrc:   PainSc: 7       Patients Stated Pain Goal: 3 (05/09/24 1701)  Complications: No notable events documented.

## 2024-05-09 NOTE — Brief Op Note (Signed)
 05/09/2024  6:54 PM  PATIENT:  Cody Preston  58 y.o. male  PRE-OPERATIVE DIAGNOSIS: Left hip basicervical fracture  POST-OPERATIVE DIAGNOSIS: Same PROCEDURE:  Procedure(s) with comments: FIXATION, FRACTURE, INTERTROCHANTERIC, WITH INTRAMEDULLARY ROD (Left) - INTRAMEDULLARY NAIL LEFT FEMUR  SURGEON:  Surgeons and Role:    * Sharl Selinda Dover, MD - Primary  PHYSICIAN ASSISTANT: None    ANESTHESIA:   general  EBL:  100 mL   BLOOD ADMINISTERED:none  DRAINS: none   LOCAL MEDICATIONS USED:  NONE  SPECIMEN:  No Specimen  DISPOSITION OF SPECIMEN:  N/A  COUNTS:  YES  TOURNIQUET:  * No tourniquets in log *  DICTATION: .Note written in EPIC  PLAN OF CARE: Admit to inpatient   PATIENT DISPOSITION:  PACU - hemodynamically stable.   Delay start of Pharmacological VTE agent (>24hrs) due to surgical blood loss or risk of bleeding: not applicable

## 2024-05-09 NOTE — Plan of Care (Signed)

## 2024-05-09 NOTE — Progress Notes (Signed)
 Patient arrived to unit and admission history taken. Patients sister Erminio called and given an update.

## 2024-05-09 NOTE — Progress Notes (Signed)
 Echocardiogram 2D Echocardiogram has been performed.  Cody Preston Cody Preston RDCS 05/09/2024, 2:13 PM

## 2024-05-09 NOTE — Anesthesia Procedure Notes (Signed)
 Procedure Name: Intubation Date/Time: 05/09/2024 5:51 PM  Performed by: Jerl Donald LABOR, CRNAPre-anesthesia Checklist: Patient identified, Emergency Drugs available, Suction available and Patient being monitored Patient Re-evaluated:Patient Re-evaluated prior to induction Oxygen Delivery Method: Circle System Utilized Preoxygenation: Pre-oxygenation with 100% oxygen Induction Type: IV induction Ventilation: Oral airway inserted - appropriate to patient size and Two handed mask ventilation required Laryngoscope Size: Glidescope and 3 Grade View: Grade I Tube type: Oral Tube size: 7.0 mm Number of attempts: 1 Airway Equipment and Method: Stylet, Oral airway and Video-laryngoscopy Placement Confirmation: ETT inserted through vocal cords under direct vision, positive ETCO2 and breath sounds checked- equal and bilateral Secured at: 22 cm Tube secured with: Tape Dental Injury: Teeth and Oropharynx as per pre-operative assessment

## 2024-05-09 NOTE — Consult Note (Signed)
 Reason for Consult:Left hip fx Referring Physician: Renato Applebaum Time called: [id number] Time at bedside: 0918   Cody Preston is an 58 y.o. male.  HPI: Jeri tripped on a mat at the gas station and fell. He had immediate left hip pain and could not get up. He was brought to the ED where x-rays showed a left hip fx and orthopedic surgery was consulted. He lives at home alone, does not use any assistive devices to ambulate, and is on disability from a back injury. He suffers frequent falls.  Past Medical History:  Diagnosis Date   Back pain    Coronary artery disease    Hypercholesteremia    Hyperlipidemia    Primary hypertension 09/27/2022    Past Surgical History:  Procedure Laterality Date   AVGG REMOVAL Right 05/20/2020   Procedure: REMOVAL OF STENT RIGHT ULNAR ARTERY AND PRIMARY REPAIR OF ULNAR ARTERY;  Surgeon: Eliza Lonni RAMAN, MD;  Location: [facility] OR;  Service: Vascular;  Laterality: Right;   BACK SURGERY     BACK SURGERY     CLUB FOOT RELEASE     CORONARY ATHERECTOMY N/A 12/18/2019   Procedure: CORONARY ATHERECTOMY;  Surgeon: Elmira Newman PARAS, MD;  Location: [facility];  Service: Cardiovascular;  Laterality: N/A;   CORONARY BALLOON ANGIOPLASTY N/A 05/20/2020   Procedure: CORONARY BALLOON ANGIOPLASTY;  Surgeon: Ladona Heinz, MD;  Location: [facility];  Service: Cardiovascular;  Laterality: N/A;   CORONARY STENT INTERVENTION  12/18/2019   CORONARY STENT INTERVENTION N/A 12/18/2019   Procedure: CORONARY STENT INTERVENTION;  Surgeon: Elmira Newman PARAS, MD;  Location: [facility];  Service: Cardiovascular;  Laterality: N/A;   CORONARY STENT INTERVENTION N/A 07/15/2020   Procedure: CORONARY STENT INTERVENTION;  Surgeon: Ladona Heinz, MD;  Location: [facility];  Service: Cardiovascular;  Laterality: N/A;   CORONARY THROMBECTOMY N/A 04/19/2020   Procedure: Coronary Thrombectomy;  Surgeon: Ladona Heinz, MD;  Location: [facility] INVASIVE CV LAB;  Service: Cardiovascular;   Laterality: N/A;   CORONARY ULTRASOUND/IVUS N/A 12/18/2019   Procedure: Intravascular Ultrasound/IVUS;  Surgeon: Elmira Newman PARAS, MD;  Location: [facility];  Service: Cardiovascular;  Laterality: N/A;   CORONARY/GRAFT ACUTE MI REVASCULARIZATION N/A 04/19/2020   Procedure: Coronary/Graft Acute MI Revascularization;  Surgeon: Ladona Heinz, MD;  Location: [facility] INVASIVE CV LAB;  Service: Cardiovascular;  Laterality: N/A;   LEFT HEART CATH AND CORONARY ANGIOGRAPHY N/A 12/18/2019   Procedure: LEFT HEART CATH AND CORONARY ANGIOGRAPHY;  Surgeon: Elmira Newman PARAS, MD;  Location: [facility];  Service: Cardiovascular;  Laterality: N/A;   LEFT HEART CATH AND CORONARY ANGIOGRAPHY N/A 04/19/2020   Procedure: LEFT HEART CATH AND CORONARY ANGIOGRAPHY;  Surgeon: Ladona Heinz, MD;  Location: [facility];  Service: Cardiovascular;  Laterality: N/A;   SHUNT REPLACEMENT     at 65 months of age   76 REMOVAL Right 05/20/2020   REMOVAL CORONARY STENT FROM RIGHT ULNAR ARTER    Family History  Problem Relation Age of Onset   Heart attack Mother    Arthritis Mother    Lung cancer Mother    Alzheimer's disease Father    Allergies Sister    Arthritis Sister    Cancer Sister    Heart attack Brother    Hyperlipidemia Brother     Social History:  reports that he has never smoked. He has never used smokeless tobacco. He reports current alcohol use of about 1.0 standard drink of alcohol per week.  He reports that he does not use drugs.  Allergies:  Allergies  Allergen Reactions   Bee Venom Swelling and Other (See Comments)    Finger became blue and he had to be rushed to a hospital   Wasp Venom Swelling and Other (See Comments)    Skin turns black and blue at site stung, near syncope    Medications: I have reviewed the patient's current medications.  Results for orders placed or performed during the hospital encounter of 05/08/24 (from the past 48 hours)  Basic metabolic panel      Status: Abnormal   Collection Time: 05/08/24  4:09 PM  Result Value Ref Range   Sodium 131 (L) 135 - 145 mmol/L   Potassium 4.7 3.5 - 5.1 mmol/L    Comment: HEMOLYSIS AT THIS LEVEL MAY AFFECT RESULT   Chloride 99 98 - 111 mmol/L   CO2 25 22 - 32 mmol/L   Glucose, Bld 109 (H) 70 - 99 mg/dL    Comment: Glucose reference range applies only to samples taken after fasting for at least 8 hours.   BUN 11 6 - 20 mg/dL   Creatinine, Ser 9.05 0.61 - 1.24 mg/dL   Calcium  8.5 (L) 8.9 - 10.3 mg/dL   GFR, Estimated >39 >39 mL/min    Comment: (NOTE) Calculated using the CKD-EPI Creatinine Equation (2021)    Anion gap 7 5 - 15    Comment: Performed at [facility], [address] [place], [place] [address]  Ethanol     Status: None   Collection Time: 05/08/24  4:09 PM  Result Value Ref Range   Alcohol, Ethyl (B) <15 <15 mg/dL    Comment: (NOTE) For medical purposes only. Performed at [facility], [address] [place], [place] [address]   CBC with Differential     Status: None   Collection Time: 05/08/24  4:09 PM  Result Value Ref Range   WBC 8.2 4.0 - 10.5 K/uL   RBC 4.85 4.22 - 5.81 MIL/uL   Hemoglobin 13.2 13.0 - 17.0 g/dL   HCT 59.4 60.9 - 47.9 %   MCV 83.5 80.0 - 100.0 fL   MCH 27.2 26.0 - 34.0 pg   MCHC 32.6 30.0 - 36.0 g/dL   RDW 85.3 88.4 - 84.4 %   Platelets 356 150 - 400 K/uL   nRBC 0.0 0.0 - 0.2 %   Neutrophils Relative % 78 %   Neutro Abs 6.5 1.7 - 7.7 K/uL   Lymphocytes Relative 10 %   Lymphs Abs 0.8 0.7 - 4.0 K/uL   Monocytes Relative 9 %   Monocytes Absolute 0.7 0.1 - 1.0 K/uL   Eosinophils Relative 1 %   Eosinophils Absolute 0.1 0.0 - 0.5 K/uL   Basophils Relative 1 %   Basophils Absolute 0.1 0.0 - 0.1 K/uL   Immature Granulocytes 1 %   Abs Immature Granulocytes 0.04 0.00 - 0.07 K/uL    Comment: Performed at [facility], [address], [place], [place] [address]  CBC     Status: Abnormal   Collection Time: 05/09/24  8:32 AM  Result Value  Ref Range   WBC 12.1 (H) 4.0 - 10.5 K/uL   RBC 4.73 4.22 - 5.81 MIL/uL   Hemoglobin 12.7 (L) 13.0 - 17.0 g/dL   HCT 61.0 (L) 60.9 - 47.9 %   MCV 82.2 80.0 - 100.0 fL   MCH 26.8 26.0 - 34.0 pg   MCHC 32.6 30.0 -  36.0 g/dL   RDW 85.5 88.4 - 84.4 %   Platelets 346 150 - 400 K/uL   nRBC 0.0 0.0 - 0.2 %    Comment: Performed at [facility], [address], [place], [place] [address]    CT PELVIS WO CONTRAST Result Date: 05/08/2024 CLINICAL DATA:  Clemens.  Left femoral neck fracture. EXAM: CT PELVIS WITHOUT CONTRAST TECHNIQUE: Multidetector CT imaging of the pelvis was performed following the standard protocol without intravenous contrast. RADIATION DOSE REDUCTION: This exam was performed according to the departmental dose-optimization program which includes automated exposure control, adjustment of the mA and/or kV according to patient size and/or use of iterative reconstruction technique. COMPARISON:  Radiographs 05/08/2024 FINDINGS: There is a complex comminuted intertrochanteric fracture of the left hip with significant varus deformity. This fracture also involves the base of the cervical neck but the cervical neck is otherwise intact. The femoral head is normally located. No acetabular fracture. No right hip fracture. The pubic symphysis and SI joints are intact. No acute pelvic fractures. Remote healed right-sided pubic rami fractures. No significant intrapelvic abnormalities are identified. Age advanced vascular disease. Stable large complex right scrotal lesion possible complex hydrocele or hematoma. This was also seen on the prior ultrasound examination January. IMPRESSION: 1. Complex comminuted intertrochanteric fracture of the left hip with significant varus deformity. 2. No acetabular fracture. 3. No right hip fracture. 4. Remote healed right-sided pubic rami fractures. 5. Stable large complex right scrotal lesion possible complex hydrocele or hematoma. This was also seen on the prior  ultrasound examination January. Electronically Signed   By: MYRTIS Stammer M.D.   On: 05/08/2024 22:51   DG Femur Min 2 Views Left Result Date: 05/08/2024 CLINICAL DATA:  Fall EXAM: LEFT FEMUR 2 VIEWS COMPARISON:  None Available. FINDINGS: There is an acute left femoral neck fracture with superolateral displacement of the distal fracture fragment. There is no dislocation. Peripheral vascular calcifications are present. Soft tissues are otherwise within normal limits. IMPRESSION: Acute left femoral neck fracture. Electronically Signed   By: Greig Pique M.D.   On: 05/08/2024 17:21    Review of Systems  HENT:  Negative for ear discharge, ear pain, hearing loss and tinnitus.   Eyes:  Negative for photophobia and pain.  Respiratory:  Negative for cough and shortness of breath.   Cardiovascular:  Negative for chest pain.  Gastrointestinal:  Negative for abdominal pain, nausea and vomiting.  Genitourinary:  Negative for dysuria, flank pain, frequency and urgency.  Musculoskeletal:  Positive for arthralgias (Left hip). Negative for back pain, myalgias and neck pain.  Neurological:  Negative for dizziness and headaches.  Hematological:  Does not bruise/bleed easily.  Psychiatric/Behavioral:  The patient is not nervous/anxious.    Blood pressure (!) 119/92, pulse (!) 114, temperature 99.5 F (37.5 C), temperature source Oral, resp. rate 14, height 5' 6 (1.676 m), weight 68.9 kg, SpO2 99%. Physical Exam Constitutional:      General: He is not in acute distress.    Appearance: He is well-developed. He is not diaphoretic.  HENT:     Head: Normocephalic and atraumatic.  Eyes:     General: No scleral icterus.       Right eye: No discharge.        Left eye: No discharge.     Conjunctiva/sclera: Conjunctivae normal.  Cardiovascular:     Rate and Rhythm: Normal rate and regular rhythm.  Pulmonary:     Effort: Pulmonary effort is normal. No respiratory distress.  Musculoskeletal:     Cervical  back: Normal range of motion.     Comments: LLE No traumatic wounds, ecchymosis, or rash  Severe TTP hip  No knee or ankle effusion  Knee stable to varus/ valgus and anterior/posterior stress  Sens DPN, SPN, TN intact  Motor EHL, ext, flex, evers 5/5  DP 2+, PT 2+, No significant edema  Skin:    General: Skin is warm and dry.  Neurological:     Mental Status: He is alert.  Psychiatric:        Mood and Affect: Mood normal.        Behavior: Behavior normal.     Assessment/Plan: Left hip fx -- Will schedule for this evening but may be tomorrow. Please keep NPO.    Ozell DOROTHA Ned, PA-C Orthopedic Surgery [phone] 05/09/2024, 9:22 AM

## 2024-05-09 NOTE — Transfer of Care (Deleted)
 Immediate Anesthesia Transfer of Care Note  Patient: Cody Preston  Procedure(s) Performed: FIXATION, FRACTURE, INTERTROCHANTERIC, WITH INTRAMEDULLARY ROD (Left)  Patient Location: PACU  Anesthesia Type:General  Level of Consciousness: awake and patient cooperative  Airway & Oxygen Therapy: Patient Spontanous Breathing and Patient connected to nasal cannula oxygen  Post-op Assessment: Report given to RN, Post -op Vital signs reviewed and stable, and Patient moving all extremities  Post vital signs: Reviewed and stable  Last Vitals:  Vitals Value Taken Time  BP 114/82 05/09/24 17:01  Temp 37.1 C 05/09/24 17:01  Pulse 100 05/09/24 17:01  Resp 15 05/09/24 17:01  SpO2 96 % 05/09/24 17:01    Last Pain:  Vitals:   05/09/24 1701  TempSrc: Oral  PainSc: 7       Patients Stated Pain Goal: 3 (05/09/24 1701)  Complications: No notable events documented.

## 2024-05-09 NOTE — Progress Notes (Addendum)
 Progress Note  Patient Name: Cody Preston Date of Encounter: 05/09/2024 [facility] Cardiologist: Gordy Bergamo, MD   Interval Summary   Patient complaining of pain from hip fracture.  Denies chest pain, shortness of breath, fever, chills, nausea, vomiting, and diaphoresis  Vital Signs Vitals:   05/09/24 0130 05/09/24 0300 05/09/24 0346 05/09/24 0715  BP: 119/82 (!) 124/93  (!) 119/92  Pulse:  (!) 118  (!) 114  Resp:  17  14  Temp:   99.7 F (37.6 C) 99.5 F (37.5 C)  TempSrc:   Oral Oral  SpO2:  97%  99%  Weight:      Height:        Intake/Output Summary (Last 24 hours) at 05/09/2024 0742 Last data filed at 05/09/2024 0135 Gross per 24 hour  Intake --  Output 420 ml  Net -420 ml      05/08/2024    3:11 PM 04/02/2024    2:26 PM 07/13/2023   12:56 PM  Last 3 Weights  Weight (lbs) 152 lb 152 lb 157 lb  Weight (kg) 68.947 kg 68.947 kg 71.215 kg      Telemetry/ECG  Sinus tachycardia with rates in the 100s to 110s- Personally Reviewed  Physical Exam  GEN: No acute distress.   Neck: No JVD Cardiac: Tachycardic with regular rhythm, no murmurs, rubs, or gallops.  Respiratory: Clear to auscultation bilaterally. GI: Soft, nontender, non-distended  MS: No edema.  Left leg tender.  Assessment & Plan  Cody Preston is a 58 y.o. male with a hx of CAD status post stent in the LAD in 2021, hypertension, GERD, chronic back pain, hx of DVT and pulmonary embolism in 1998 who is being seen 05/08/2024 for the evaluation for preoperative evaluation for plan for hip surgery at the request of Dr. Arthea   Left hip fracture  tripped on a mat and fell on his hip.  Will plan for surgery today. He mows 1/4 acre yard with a lawnmower that needs to be pushed.  Is able to go shopping and walk around large stores and carry in his groceries from the car.  Could do more than 4 metabolic equivalents of exertion prior to fall. Was seen by Dr. Sheena yesterday and felt to be stable from a  cardiovascular standpoint he can proceed with his much need surgery despite being at an intermediate risk for major adverse cardiovascular event.    CAD s/p DES to LAD in 2021 Hyperlipidemia no anginal symptoms at this time.  LDL goal less than 55.  Will recheck lipid panel. Continue with aspirin  81 mg daily and Crestor  20 mg daily   Hx of ischemic cardiomyopathy  Echo 2021 Ef 40-45% this was in the setting of the patient's NSTEMI.  On physical exam showed no signs of volume overload.  Recommend limiting IV fluids.  Plan to recheck echo to reevaluate LVEF.   Hypertension  Sinus tachycardia blood pressure slightly elevated most recent BP 119/92, continue to monitor blood pressure.  Heart rates have been tachycardic on telemetry suspect this is likely from pain patient is having from hip fracture. Continue Toprol  XL 50 mg daily.   Prediabetes  Hba1c in 2021 5.9.     For questions or updates, please contact [facility] Please consult [email or web address] for contact info under       Signed, Morse Clause, PA-C   Personally seen and examined. Agree with above.  As stated above and in prior consult note from Dr. Sheena,  he may proceed with hip surgery with moderate overall risk given his prior coronary disease.  Prior stent in the LAD in 2021 had been stable.  Able to complete greater than 4 METS of activity.  Back in 2021 ejection fraction was in the 40 to 45% range in the setting of non-ST elevation myocardial infarction.  Seems stable.  Euvolemic.  No adverse arrhythmias.  May proceed.  We will sign off.  Please let us  know if we can be of further assistance.  Oneil Parchment, MD

## 2024-05-09 NOTE — Op Note (Signed)
 Date of Surgery: 05/09/2024  INDICATIONS: Mr. Cody Preston is a 58 y.o.-year-old male who sustained a left hip fracture. The risks and benefits of the procedure discussed with the patient prior to the procedure and all questions were answered; consent was obtained.  PREOPERATIVE DIAGNOSIS: left hip fracture, basicervical  POSTOPERATIVE DIAGNOSIS: Same   PROCEDURE: Treatment of intertrochanteric, pertrochanteric, subtrochanteric fracture with intramedullary implant. CPT [phone]   SURGEON: Selinda SQUIBB. Sharl, M.D.   ANESTHESIA: general   IV FLUIDS AND URINE: See anesthesia record   ESTIMATED BLOOD LOSS: 100cc  IMPLANTS: Arthrex 10 mm x 200 mm short 125 degree nail within 85 mm proximal compression screw and a 34 mm distal locking screw.  DRAINS: None.   COMPLICATIONS: None.   DESCRIPTION OF PROCEDURE: The patient was brought to the operating room and placed supine on the operating table. The patient's leg had been signed prior to the procedure. The patient had the anesthesia placed by the anesthesiologist. The prep verification and incision time-outs were performed to confirm that this was the correct patient, site, side and location. The patient had an SCD on the opposite lower extremity. The patient did receive antibiotics prior to the incision and was re-dosed during the procedure as needed at indicated intervals. The patient was positioned on the fracture table with the table in traction and internal rotation to reduce the hip. The well leg was placed in a scissor position and all bony prominences were well-padded. The patient had the lower extremity prepped and draped in the standard surgical fashion. The incision was made 4 finger breadths superior to the greater trochanter. A guide pin was inserted into the tip of the greater trochanter under fluoroscopic guidance. An opening reamer was used to gain access to the femoral canal. The nail length was measured and inserted down the femoral canal to  its proper depth. The appropriate version of insertion for the lag screw was found under fluoroscopy. A pin was inserted up the femoral neck through the jig. The length of the lag screw was then measured. The lag screw was inserted as near to center-center in the head as possible. The leg was taken out of traction, then the compression screw was used to compress across the fracture. Compression was visualized on serial xrays.   We next turned our attention to the distal interlocking screw.  This was placed through the drill guide of the nail inserter.  A small incision was made overlying the lateral thigh at the screw site, and a tonsil was used to disect down to bone.  A drill pass was made through the jig and across the nail through both cortices.  This was measured, and the appropriate screw was placed under hand power and found to have good bite.    The wound was copiously irrigated with saline and the subcutaneous layer closed with 2.0 vicryl and the skin was reapproximated with staples. The wounds were cleaned and dried a final time and a sterile dressing was placed. The hip was taken through a range of motion at the end of the case under fluoroscopic imaging to visualize the approach-withdraw phenomenon and confirm implant length in the head. The patient was then awakened from anesthesia and taken to the recovery room in stable condition. All counts were correct at the end of the case.   POSTOPERATIVE PLAN: The patient will be weight bearing as tolerated and will return in 2 weeks for staple removal and the patient will receive DVT prophylaxis based on  other medications, activity level, and risk ratio of bleeding to thrombosis.  Plan for at a minimum 6 weeks of 81 mg aspirin  once per day.   Selinda SHAUNNA Gosling, MD [facility] [phone] 6:56 PM

## 2024-05-10 DIAGNOSIS — I2510 Atherosclerotic heart disease of native coronary artery without angina pectoris: Secondary | ICD-10-CM | POA: Diagnosis not present

## 2024-05-10 LAB — CBC
HCT: 33.7 % — ABNORMAL LOW (ref 39.0–52.0)
Hemoglobin: 11.3 g/dL — ABNORMAL LOW (ref 13.0–17.0)
MCH: 27.4 pg (ref 26.0–34.0)
MCHC: 33.5 g/dL (ref 30.0–36.0)
MCV: 81.8 fL (ref 80.0–100.0)
Platelets: 312 K/uL (ref 150–400)
RBC: 4.12 MIL/uL — ABNORMAL LOW (ref 4.22–5.81)
RDW: 14.5 % (ref 11.5–15.5)
WBC: 12 K/uL — ABNORMAL HIGH (ref 4.0–10.5)
nRBC: 0 % (ref 0.0–0.2)

## 2024-05-10 LAB — BASIC METABOLIC PANEL WITH GFR
Anion gap: 10 (ref 5–15)
BUN: 11 mg/dL (ref 6–20)
CO2: 23 mmol/L (ref 22–32)
Calcium: 8.8 mg/dL — ABNORMAL LOW (ref 8.9–10.3)
Chloride: 94 mmol/L — ABNORMAL LOW (ref 98–111)
Creatinine, Ser: 0.82 mg/dL (ref 0.61–1.24)
GFR, Estimated: >60 mL/min (ref >60)
Glucose, Bld: 156 mg/dL — ABNORMAL HIGH (ref 70–99)
Potassium: 4 mmol/L (ref 3.5–5.1)
Sodium: 127 mmol/L — ABNORMAL LOW (ref 135–145)

## 2024-05-10 LAB — MAGNESIUM: Magnesium: 1.8 mg/dL (ref 1.7–2.4)

## 2024-05-10 LAB — PHOSPHORUS: Phosphorus: 2.7 mg/dL (ref 2.5–4.6)

## 2024-05-10 MED ORDER — OXYCODONE HCL 5 MG PO TABS
5.0000 mg | ORAL_TABLET | ORAL | Status: DC | PRN
  Administered 2024-05-10 – 2024-05-17 (×23): 5 mg via ORAL
  Filled 2024-05-10 (×23): qty 1

## 2024-05-10 MED ORDER — ASPIRIN 81 MG PO CHEW
81.0000 mg | CHEWABLE_TABLET | Freq: Two times a day (BID) | ORAL | Status: DC
  Administered 2024-05-10 – 2024-05-17 (×14): 81 mg via ORAL
  Filled 2024-05-10 (×14): qty 1

## 2024-05-10 NOTE — Progress Notes (Signed)
 Transition of Care [facility]) - CAGE-AID Screening   Patient Details  Name: [name] MRN: [id number] Date of Birth: Nov 12, 1965  Darice CHRISTELLA Rouleau, RN Trauma Response Nurse Phone Number: [phone] 05/10/2024, 3:56 PM    CAGE-AID Screening:    Have You Ever Felt You Ought to Cut Down on Your Drinking or Drug Use?: No Have People Annoyed You By Critizing Your Drinking Or Drug Use?: No Have You Felt Bad Or Guilty About Your Drinking Or Drug Use?: No Have You Ever Had a Drink or Used Drugs First Thing In The Morning to Steady Your Nerves or to Get Rid of a Hangover?: No CAGE-AID Score: 0  Substance Abuse Education Offered: (S) No (Denies alcohol and drug use, services declined)

## 2024-05-10 NOTE — Plan of Care (Signed)
  Problem: Education: Goal: Knowledge of General Education information will improve Description: Including pain rating scale, medication(s)/side effects and non-pharmacologic comfort measures Outcome: Progressing   Problem: Health Behavior/Discharge Planning: Goal: Ability to manage health-related needs will improve Outcome: Progressing   Problem: Clinical Measurements: Goal: Ability to maintain clinical measurements within normal limits will improve Outcome: Progressing Goal: Will remain free from infection Outcome: Progressing Goal: Diagnostic test results will improve Outcome: Progressing Goal: Respiratory complications will improve Outcome: Progressing Goal: Cardiovascular complication will be avoided Outcome: Progressing   Problem: Activity: Goal: Risk for activity intolerance will decrease Outcome: Progressing   Problem: Nutrition: Goal: Adequate nutrition will be maintained Outcome: Progressing   Problem: Coping: Goal: Level of anxiety will decrease Outcome: Progressing   Problem: Elimination: Goal: Will not experience complications related to bowel motility Outcome: Progressing Goal: Will not experience complications related to urinary retention Outcome: Progressing   Problem: Safety: Goal: Ability to remain free from injury will improve Outcome: Progressing   Problem: Skin Integrity: Goal: Risk for impaired skin integrity will decrease Outcome: Progressing   Problem: Education: Goal: Verbalization of understanding the information provided (i.e., activity precautions, restrictions, etc) will improve Outcome: Progressing Goal: Individualized Educational Video(s) Outcome: Progressing   Problem: Clinical Measurements: Goal: Postoperative complications will be avoided or minimized Outcome: Progressing   Problem: Self-Concept: Goal: Ability to maintain and perform role responsibilities to the fullest extent possible will improve Outcome: Progressing    Problem: Pain Management: Goal: Pain level will decrease Outcome: Progressing

## 2024-05-10 NOTE — Progress Notes (Signed)
 PROGRESS NOTE    Cody Preston  FMW:[id number] DOB: 1965/12/19 DOA: 05/08/2024 PCP: Leigh Lung, MD    Brief Narrative:  58 year old with history of non-STEMI, coronary stent in 2021, hypertension, hyperlipidemia, GERD, chronic low back pain presented with mechanical fall and left hip pain.  He was found to have left femoral neck fracture.  Admitted for orthopedic consultation.  Hemodynamically stable in the ER.  Also seen by cardiology.  Subjective: Patient seen and examined.  Denies any complaints.  Patient tells me that he could only get to the edge of the bed but could not walk.  No other overnight events. Telemetry monitor with intermittent sinus tachycardia.  Patient without any chest pain or palpitations.  Assessment & Plan:   Posttraumatic left hip fracture: Status post left IM nail, Dr. Sharl 7/16.   Adequate oral and IV opiates for pain control.  Also use stool softener and laxatives. Weightbearing as tolerated. Aspirin  81 mg twice daily for 6 weeks, patient is already on 81 mg daily. Mobilize with PT OT.  Referred to a skilled nursing facility for rehab.  Coronary artery disease: Currently no evidence of recurrence.  Continued on aspirin , beta-blockers and statins.    Hypertension: Stable on beta-blockers.  Hyperlipidemia: On statin.  Hyponatremia: Cause unknown.  Chronic.  Currently no indication to intervene.  Will monitor.   DVT prophylaxis: enoxaparin  (LOVENOX ) injection 40 mg Start: 05/10/24 0800 SCDs Start: 05/09/24 2040SCDs   Code Status: Full code Family Communication: None at bedside Disposition Plan: Status is: Inpatient Remains inpatient appropriate because: Immediate postop.     Consultants:  Orthopedics Cardiology  Procedures:  ORIF with IM nail, left hip.  Antimicrobials:  None     Objective: Vitals:   05/10/24 0243 05/10/24 0500 05/10/24 0725 05/10/24 1121  BP: 113/77 117/89 105/74 112/60  Pulse: 96 (!) 121 (!) 114 (!) 106  Resp:  16 15 16 16   Temp: 98.2 F (36.8 C) 99.1 F (37.3 C) 98.1 F (36.7 C) 98.4 F (36.9 C)  TempSrc: Oral Oral  Oral  SpO2: 99% 98% 95% 98%  Weight:      Height:        Intake/Output Summary (Last 24 hours) at 05/10/2024 1348 Last data filed at 05/10/2024 1012 Gross per 24 hour  Intake 600 ml  Output 400 ml  Net 200 ml   Filed Weights   05/08/24 1511 05/09/24 1530  Weight: 68.9 kg 67.9 kg    Examination:  General exam: Appears calm and comfortable .  Sitting in couch and leg extended Respiratory system: Clear to auscultation. Respiratory effort normal. Cardiovascular system: S1 & S2 heard, RRR. No pedal edema. Gastrointestinal system: Abdomen is nondistended, soft and nontender. No organomegaly or masses felt. Normal bowel sounds heard. Central nervous system: Alert and oriented. No focal neurological deficits. Extremities: Symmetric 5 x 5 power. Left hip lateral thigh incision clean and dry.  Distal neurovascular status intact.    Data Reviewed: I have personally reviewed following labs and imaging studies  CBC: Recent Labs  Lab 05/08/24 1609 05/09/24 0832 05/10/24 0545  WBC 8.2 12.1* 12.0*  NEUTROABS 6.5  --   --   HGB 13.2 12.7* 11.3*  HCT 40.5 38.9* 33.7*  MCV 83.5 82.2 81.8  PLT 356 346 312   Basic Metabolic Panel: Recent Labs  Lab 05/08/24 1609 05/09/24 0832 05/10/24 0800  NA 131* 128* 127*  K 4.7 4.0 4.0  CL 99 95* 94*  CO2 25 24 23   GLUCOSE 109* 121*  156*  BUN 11 11 11   CREATININE 0.94 0.80 0.82  CALCIUM  8.5* 8.7* 8.8*  MG  --   --  1.8  PHOS  --   --  2.7   GFR: Estimated Creatinine Clearance: 90.8 mL/min (by C-G formula based on SCr of 0.82 mg/dL). Liver Function Tests: No results for input(s): AST, ALT, ALKPHOS, BILITOT, PROT, ALBUMIN in the last 168 hours. No results for input(s): LIPASE, AMYLASE in the last 168 hours. No results for input(s): AMMONIA in the last 168 hours. Coagulation Profile: No results for input(s):  INR, PROTIME in the last 168 hours. Cardiac Enzymes: No results for input(s): CKTOTAL, CKMB, CKMBINDEX, TROPONINI in the last 168 hours. BNP (last 3 results) No results for input(s): PROBNP in the last 8760 hours. HbA1C: No results for input(s): HGBA1C in the last 72 hours. CBG: No results for input(s): GLUCAP in the last 168 hours. Lipid Profile: No results for input(s): CHOL, HDL, LDLCALC, TRIG, CHOLHDL, LDLDIRECT in the last 72 hours. Thyroid  Function Tests: No results for input(s): TSH, T4TOTAL, FREET4, T3FREE, THYROIDAB in the last 72 hours. Anemia Panel: No results for input(s): VITAMINB12, FOLATE, FERRITIN, TIBC, IRON, RETICCTPCT in the last 72 hours. Sepsis Labs: No results for input(s): PROCALCITON, LATICACIDVEN in the last 168 hours.  Recent Results (from the past 240 hours)  MRSA Next Gen by PCR, Nasal     Status: Abnormal   Collection Time: 05/09/24  5:13 PM   Specimen: Nasal Mucosa; Nasal Swab  Result Value Ref Range Status   MRSA by PCR Next Gen DETECTED (A) NOT DETECTED Final    Comment: RESULT CALLED TO, READ BACK BY AND VERIFIED WITH: RN DRISS ZINE [phone] FCP (NOTE) The GeneXpert MRSA Assay (FDA approved for NASAL specimens only), is one component of a comprehensive MRSA colonization surveillance program. It is not intended to diagnose MRSA infection nor to guide or monitor treatment for MRSA infections. Test performance is not FDA approved in patients less than 39 years old. Performed at [facility], [address] [place], [place] [address]          Radiology Studies: DG HIP UNILAT WITH PELVIS 2-3 VIEWS LEFT Result Date: 05/09/2024 CLINICAL DATA:  Known left femoral fracture EXAM: DG HIP (WITH OR WITHOUT PELVIS) 2-3V LEFT COMPARISON:  05/08/2024 FLUOROSCOPY TIME:  Radiation Exposure Index (as provided by the fluoroscopic device): 5.97 mGy If the device does not provide the exposure  index: Fluoroscopy Time:  59 seconds Number of Acquired Images:  5 FINDINGS: Initial images demonstrate medullary rod within the proximal left femur. Fixation screw was then placed traversing the femoral neck. Distal fixation screw in the proximal femoral shaft is noted as well. Fracture fragments are in near anatomic alignment. IMPRESSION: ORIF of proximal left femoral fracture. Electronically Signed   By: Oneil Devonshire M.D.   On: 05/09/2024 21:40   DG C-Arm 1-60 Min-No Report Result Date: 05/09/2024 Fluoroscopy was utilized by the requesting physician.  No radiographic interpretation.   ECHOCARDIOGRAM COMPLETE Result Date: 05/09/2024    ECHOCARDIOGRAM REPORT   Patient Name:   EJAY LASHLEY Date of Exam: 05/09/2024 Medical Rec #:  [id number]   Height:       66.0 in Accession #:    [id number]  Weight:       152.0 lb Date of Birth:  1966-02-07   BSA:          1.780 m Patient Age:    57 years    BP:  119/92 mmHg Patient Gender: M           HR:           109 bpm. Exam Location:  Inpatient Procedure: 2D Echo, Color Doppler and Cardiac Doppler (Both Spectral and Color            Flow Doppler were utilized during procedure). Indications:    I25.5 Ischemic cardiomyopathy, Pre-op  History:        Patient has prior history of Echocardiogram examinations, most                 recent 04/20/2020. CAD; Risk Factors:Hypertension and                 Dyslipidemia.  Sonographer:    Damien Senior RDCS Referring Phys: [id number] ZANE ADAMS  Sonographer Comments: Patient laying towards right side, unable to reposition due to hip fracture IMPRESSIONS  1. Left ventricular ejection fraction, by estimation, is 50 to 55%. The left ventricle has low normal function. The left ventricle demonstrates global hypokinesis. Left ventricular diastolic parameters are indeterminate.  2. Right ventricular systolic function is normal. The right ventricular size is normal.  3. The mitral valve is normal in structure. No evidence of mitral valve  regurgitation. No evidence of mitral stenosis.  4. The aortic valve is normal in structure. Aortic valve regurgitation is not visualized. No aortic stenosis is present.  5. The inferior vena cava is normal in size with greater than 50% respiratory variability, suggesting right atrial pressure of 3 mmHg. FINDINGS  Left Ventricle: Left ventricular ejection fraction, by estimation, is 50 to 55%. The left ventricle has low normal function. The left ventricle demonstrates global hypokinesis. The left ventricular internal cavity size was normal in size. There is no left ventricular hypertrophy. Left ventricular diastolic parameters are indeterminate. Right Ventricle: The right ventricular size is normal. No increase in right ventricular wall thickness. Right ventricular systolic function is normal. Left Atrium: Left atrial size was normal in size. Right Atrium: Right atrial size was normal in size. Pericardium: There is no evidence of pericardial effusion. Mitral Valve: The mitral valve is normal in structure. No evidence of mitral valve regurgitation. No evidence of mitral valve stenosis. Tricuspid Valve: The tricuspid valve is normal in structure. Tricuspid valve regurgitation is not demonstrated. No evidence of tricuspid stenosis. Aortic Valve: The aortic valve is normal in structure. Aortic valve regurgitation is not visualized. No aortic stenosis is present. Pulmonic Valve: The pulmonic valve was normal in structure. Pulmonic valve regurgitation is not visualized. No evidence of pulmonic stenosis. Aorta: The aortic root is normal in size and structure. Venous: The inferior vena cava is normal in size with greater than 50% respiratory variability, suggesting right atrial pressure of 3 mmHg. IAS/Shunts: No atrial level shunt detected by color flow Doppler.  LEFT VENTRICLE PLAX 2D LVIDd:         3.90 cm LVIDs:         2.50 cm LV PW:         0.80 cm LV IVS:        0.80 cm LVOT diam:     2.20 cm LV SV:         57 LV SV  Index:   32 LVOT Area:     3.80 cm  LV Volumes (MOD) LV vol d, MOD A2C: 63.6 ml LV vol s, MOD A2C: 34.5 ml LV SV MOD A2C:     29.1 ml RIGHT VENTRICLE RV S prime:  11.60 cm/s TAPSE (M-mode): 1.6 cm LEFT ATRIUM             Index        RIGHT ATRIUM          Index LA diam:        3.30 cm 1.85 cm/m   RA Area:     8.59 cm LA Vol (A2C):   37.1 ml 20.85 ml/m  RA Volume:   12.90 ml 7.25 ml/m LA Vol (A4C):   38.1 ml 21.41 ml/m LA Biplane Vol: 38.7 ml 21.75 ml/m  AORTIC VALVE LVOT Vmax:   84.60 cm/s LVOT Vmean:  63.300 cm/s LVOT VTI:    0.149 m  AORTA Ao Root diam: 3.30 cm Ao Asc diam:  3.10 cm  SHUNTS Systemic VTI:  0.15 m Systemic Diam: 2.20 cm Kardie Tobb DO Electronically signed by Dub Huntsman DO Signature Date/Time: 05/09/2024/3:06:04 PM    Final    CT PELVIS WO CONTRAST Result Date: 05/08/2024 CLINICAL DATA:  Clemens.  Left femoral neck fracture. EXAM: CT PELVIS WITHOUT CONTRAST TECHNIQUE: Multidetector CT imaging of the pelvis was performed following the standard protocol without intravenous contrast. RADIATION DOSE REDUCTION: This exam was performed according to the departmental dose-optimization program which includes automated exposure control, adjustment of the mA and/or kV according to patient size and/or use of iterative reconstruction technique. COMPARISON:  Radiographs 05/08/2024 FINDINGS: There is a complex comminuted intertrochanteric fracture of the left hip with significant varus deformity. This fracture also involves the base of the cervical neck but the cervical neck is otherwise intact. The femoral head is normally located. No acetabular fracture. No right hip fracture. The pubic symphysis and SI joints are intact. No acute pelvic fractures. Remote healed right-sided pubic rami fractures. No significant intrapelvic abnormalities are identified. Age advanced vascular disease. Stable large complex right scrotal lesion possible complex hydrocele or hematoma. This was also seen on the prior  ultrasound examination January. IMPRESSION: 1. Complex comminuted intertrochanteric fracture of the left hip with significant varus deformity. 2. No acetabular fracture. 3. No right hip fracture. 4. Remote healed right-sided pubic rami fractures. 5. Stable large complex right scrotal lesion possible complex hydrocele or hematoma. This was also seen on the prior ultrasound examination January. Electronically Signed   By: MYRTIS Stammer M.D.   On: 05/08/2024 22:51   DG Femur Min 2 Views Left Result Date: 05/08/2024 CLINICAL DATA:  Fall EXAM: LEFT FEMUR 2 VIEWS COMPARISON:  None Available. FINDINGS: There is an acute left femoral neck fracture with superolateral displacement of the distal fracture fragment. There is no dislocation. Peripheral vascular calcifications are present. Soft tissues are otherwise within normal limits. IMPRESSION: Acute left femoral neck fracture. Electronically Signed   By: Greig Pique M.D.   On: 05/08/2024 17:21        Scheduled Meds:  aspirin   81 mg Oral BID   docusate sodium   100 mg Oral BID   enoxaparin  (LOVENOX ) injection  40 mg Subcutaneous Q24H   metoprolol  succinate  50 mg Oral Daily   mupirocin  ointment   Nasal BID   pantoprazole   40 mg Oral Daily   rosuvastatin   20 mg Oral Daily   Continuous Infusions:     LOS: 2 days    Time spent: 40 minutes    Renato Applebaum, MD [facility]

## 2024-05-10 NOTE — Evaluation (Signed)
 Physical Therapy Evaluation Patient Details Name: Cody Preston MRN: [id number] DOB: 1966/04/30 Today's Date: 05/10/2024  History of Present Illness  Cody Preston is a 58 y.o. male who presented to ED 05/08/24 following ground level fall at a gas station. X-ray revealed a left femoral neck fracture. Pt s/p Lt intramedullary rod and nail 7/16. PMHx: NSTEMI, stent in 2021, HTN, HLD, GERD, and chronic low back pain.   Clinical Impression  Pt admitted with above diagnosis. PTA, pt was independent with functional mobility, ADLs/IADLs, driving, and retired. He reports that he is always falling but was unable to provide a specific number in the past 19mo. Pt is planning to d/c to his sister house where she and her husband can provide 24/7 supervision and assist and there are 2 STE. Pt currently with functional limitations due to the deficits listed below (see PT Problem List). He required minA for bed mobility, CGA-supervision for transfers using RW, and CGA for gait using RW. Pt self-limited weight bearing on LLE d/t pain. He demonstrated improved gait mechanics after donning his tennis shoes. Pt needs to practice stairs next session. Pt will benefit from acute skilled PT to increase his independence and safety with mobility to allow discharge. Recommend HHPT to increase ROM/strength, improve balance, decrease fall risk, and optimize safety within the home environment.      If plan is discharge home, recommend the following: A little help with walking and/or transfers;A little help with bathing/dressing/bathroom;Assistance with cooking/housework;Assist for transportation;Help with stairs or ramp for entrance   Can travel by private vehicle        Equipment Recommendations Rolling walker (2 wheels)  Recommendations for Other Services       Functional Status Assessment Patient has had a recent decline in their functional status and demonstrates the ability to make significant improvements in function in  a reasonable and predictable amount of time.     Precautions / Restrictions Precautions Precautions: Fall Recall of Precautions/Restrictions: Intact Restrictions Weight Bearing Restrictions Per Provider Order: Yes LLE Weight Bearing Per Provider Order: Weight bearing as tolerated      Mobility  Bed Mobility Overal bed mobility: Needs Assistance Bed Mobility: Supine to Sit     Supine to sit: Min assist, HOB elevated, Used rails     General bed mobility comments: Pt sat up on L side of bed with increased time. Assist to bring LLE of EOB and scoot hip fwd til feet flat with use of bed pad. He pulled up trunk with use of bedrails.    Transfers Overall transfer level: Needs assistance Equipment used: Rolling walker (2 wheels) Transfers: Sit to/from Stand, Bed to chair/wheelchair/BSC Sit to Stand: Contact guard assist, Supervision   Step pivot transfers: Contact guard assist       General transfer comment: Pt stood from lowest bed height. Cued proper hand palcement using RW. Powered up with CGA. Pt stood from recliner chair and demonstrated proper hand placement on RW without physical assist to power up. Good eccentric control with sitting.    Ambulation/Gait Ambulation/Gait assistance: Contact guard assist Gait Distance (Feet): 120 Feet Assistive device: Rolling walker (2 wheels) Gait Pattern/deviations: Step-through pattern, Decreased stride length, Decreased weight shift to left, Antalgic, Trunk flexed Gait velocity: decreased Gait velocity interpretation: <1.8 ft/sec, indicate of risk for recurrent falls   General Gait Details: Pt initially ambulated with short, slow, shuffling steps. He asked to don tennis shoes and demonstrated improved gait mechanics with adequate foot clearence. Cued pt to increased  WBing through BUE support on RW to offload LEs. Pt manuevered within room/hallway well. He displayed a fwd lean, cues for proximity to RW.  Stairs             Wheelchair Mobility     Tilt Bed    Modified Rankin (Stroke Patients Only)       Balance Overall balance assessment: Needs assistance, History of Falls Sitting-balance support: Bilateral upper extremity supported, Feet supported Sitting balance-Leahy Scale: Fair Sitting balance - Comments: Pt sat EOB with supervision.   Standing balance support: Bilateral upper extremity supported, During functional activity, Reliant on assistive device for balance Standing balance-Leahy Scale: Poor Standing balance comment: Pt dependent on RW                             Pertinent Vitals/Pain Pain Assessment Pain Assessment: Faces Faces Pain Scale: Hurts even more Pain Location: Lt hip Pain Descriptors / Indicators: Guarding, Grimacing, Discomfort, Sharp Pain Intervention(s): Monitored during session, Limited activity within patient's tolerance, Repositioned    Home Living Family/patient expects to be discharged to:: Private residence Living Arrangements: Other relatives (Sister and her husband) Available Help at Discharge: Family;Available 24 hours/day Type of Home: House Home Access: Stairs to enter Entrance Stairs-Rails: None Entrance Stairs-Number of Steps: 2   Home Layout: One level Home Equipment: Cane - single Tax inspector (4 wheels) Additional Comments: Planning on d/c'ing to his sisters house for assist initially. Above home info is for his sister's place.    Prior Function Prior Level of Function : Independent/Modified Independent;Driving;History of Falls (last six months)             Mobility Comments: Indep without AD. Pt reports he is always falling because his back gives out. Unable to provide a definite number ADLs Comments: Indep ADL/IADLs. Retired. Drives.     Extremity/Trunk Assessment   Upper Extremity Assessment Upper Extremity Assessment: Overall WFL for tasks assessed    Lower Extremity Assessment Lower Extremity  Assessment: LLE deficits/detail (RLE overall WFL for tasks assessed) LLE Deficits / Details: Pt actively guarding against hip/knee PROM. Grossly 3-/5 hip/knee strength. WFL ankle ROM/strength. LLE: Unable to fully assess due to pain LLE Coordination: decreased gross motor       Communication   Communication Communication: No apparent difficulties    Cognition Arousal: Alert Behavior During Therapy: WFL for tasks assessed/performed   PT - Cognitive impairments: No apparent impairments                       PT - Cognition Comments: PT A,Ox4 Following commands: Intact       Cueing Cueing Techniques: Verbal cues, Visual cues, Tactile cues     General Comments      Exercises     Assessment/Plan    PT Assessment Patient needs continued PT services  PT Problem List Decreased strength;Decreased range of motion;Decreased activity tolerance;Decreased balance;Decreased mobility;Decreased knowledge of use of DME;Decreased safety awareness       PT Treatment Interventions DME instruction;Gait training;Stair training;Functional mobility training;Therapeutic activities;Therapeutic exercise;Balance training;Patient/family education    PT Goals (Current goals can be found in the Care Plan section)  Acute Rehab PT Goals Patient Stated Goal: Return Home as soon as possible PT Goal Formulation: With patient Time For Goal Achievement: 05/24/24 Potential to Achieve Goals: Good    Frequency Min 2X/week     Co-evaluation  AM-PAC PT 6 Clicks Mobility  Outcome Measure Help needed turning from your back to your side while in a flat bed without using bedrails?: A Little Help needed moving from lying on your back to sitting on the side of a flat bed without using bedrails?: A Little Help needed moving to and from a bed to a chair (including a wheelchair)?: A Little Help needed standing up from a chair using your arms (e.g., wheelchair or bedside chair)?: A  Little Help needed to walk in hospital room?: A Little Help needed climbing 3-5 steps with a railing? : A Lot 6 Click Score: 17    End of Session Equipment Utilized During Treatment: Gait belt Activity Tolerance: Patient tolerated treatment well Patient left: in chair;with call bell/phone within reach;with chair alarm set Nurse Communication: Mobility status PT Visit Diagnosis: Difficulty in walking, not elsewhere classified (R26.2);Unsteadiness on feet (R26.81);History of falling (Z91.81)    Time: 8651-8581 PT Time Calculation (min) (ACUTE ONLY): 30 min   Charges:   PT Evaluation $PT Eval Moderate Complexity: 1 Mod PT Treatments $Gait Training: 8-22 mins PT General Charges $$ ACUTE PT VISIT: 1 Visit         Randall SAUNDERS, PT, DPT Acute Rehabilitation Services Office: [phone] Secure Chat Preferred  Delon CHRISTELLA Callander 05/10/2024, 2:32 PM

## 2024-05-10 NOTE — Progress Notes (Signed)
 Yellow MEWS protocol was activated due to the patient's tachycardia, with a heart rate of 120 bpm. The patient remains asymptomatic and denies chest pain, dizziness, or shortness of breath. The on-call provider, A. Andrez, NP, and charge RN Darina were notified. Per the provider's order, the patient's scheduled 10:00 AM dose of metoprolol  was administered early. The patient will continue to be closely monitored per Yellow MEWS protocol.    05/10/24 0500  Assess: MEWS Score  Temp 99.1 F (37.3 C)  BP 117/89  MAP (mmHg) 98  Pulse Rate (!) 121  Resp 15  SpO2 98 %  O2 Device Room Air  Assess: MEWS Score  MEWS Temp 0  MEWS Systolic 0  MEWS Pulse 2  MEWS RR 0  MEWS LOC 0  MEWS Score 2  MEWS Score Color Yellow  Assess: if the MEWS score is Yellow or Red  Were vital signs accurate and taken at a resting state? Yes  Does the patient meet 2 or more of the SIRS criteria? No  MEWS guidelines implemented  Yes, yellow  Treat  MEWS Interventions Considered administering scheduled or prn medications/treatments as ordered  Take Vital Signs  Increase Vital Sign Frequency  Yellow: Q2hr x1, continue Q4hrs until patient remains green for 12hrs  Escalate  MEWS: Escalate Yellow: Discuss with charge nurse and consider notifying provider and/or RRT  Notify: Charge Nurse/RN  Name of Charge Nurse/RN Notified [facility]  Provider Notification  Provider Name/Title A. Chavez/ NP  Date Provider Notified 05/10/24  Time Provider Notified 0507  Method of Notification Page  Notification Reason Other (Comment) (tachycardia 120 bpm)  Provider response See new orders  Date of Provider Response 05/10/24  Time of Provider Response 0515  Notify: Rapid Response  Name of Rapid Response RN Notified  (No need for consultation, pt is stable)  Assess: SIRS CRITERIA  SIRS Temperature  0  SIRS Respirations  0  SIRS Pulse 1  SIRS WBC 0  SIRS Score Sum  1

## 2024-05-11 ENCOUNTER — Encounter (HOSPITAL_COMMUNITY): Payer: Self-pay | Admitting: Orthopedic Surgery

## 2024-05-11 DIAGNOSIS — I2510 Atherosclerotic heart disease of native coronary artery without angina pectoris: Secondary | ICD-10-CM | POA: Diagnosis not present

## 2024-05-11 LAB — BASIC METABOLIC PANEL WITH GFR
Anion gap: 11 (ref 5–15)
BUN: 14 mg/dL (ref 6–20)
CO2: 24 mmol/L (ref 22–32)
Calcium: 9.1 mg/dL (ref 8.9–10.3)
Chloride: 94 mmol/L — ABNORMAL LOW (ref 98–111)
Creatinine, Ser: 0.96 mg/dL (ref 0.61–1.24)
GFR, Estimated: >60 mL/min (ref >60)
Glucose, Bld: 147 mg/dL — ABNORMAL HIGH (ref 70–99)
Potassium: 3.8 mmol/L (ref 3.5–5.1)
Sodium: 129 mmol/L — ABNORMAL LOW (ref 135–145)

## 2024-05-11 LAB — CBC WITH DIFFERENTIAL/PLATELET
Abs Immature Granulocytes: 0.05 K/uL (ref 0.00–0.07)
Basophils Absolute: 0 K/uL (ref 0.0–0.1)
Basophils Relative: 0 %
Eosinophils Absolute: 0 K/uL (ref 0.0–0.5)
Eosinophils Relative: 0 %
HCT: 33.8 % — ABNORMAL LOW (ref 39.0–52.0)
Hemoglobin: 11.2 g/dL — ABNORMAL LOW (ref 13.0–17.0)
Immature Granulocytes: 1 %
Lymphocytes Relative: 18 %
Lymphs Abs: 1.8 K/uL (ref 0.7–4.0)
MCH: 27.1 pg (ref 26.0–34.0)
MCHC: 33.1 g/dL (ref 30.0–36.0)
MCV: 81.6 fL (ref 80.0–100.0)
Monocytes Absolute: 1.4 K/uL — ABNORMAL HIGH (ref 0.1–1.0)
Monocytes Relative: 13 %
Neutro Abs: 6.9 K/uL (ref 1.7–7.7)
Neutrophils Relative %: 68 %
Platelets: 316 K/uL (ref 150–400)
RBC: 4.14 MIL/uL — ABNORMAL LOW (ref 4.22–5.81)
RDW: 14.6 % (ref 11.5–15.5)
WBC: 10.2 K/uL (ref 4.0–10.5)
nRBC: 0 % (ref 0.0–0.2)

## 2024-05-11 MED ORDER — SENNOSIDES-DOCUSATE SODIUM 8.6-50 MG PO TABS
1.0000 | ORAL_TABLET | Freq: Two times a day (BID) | ORAL | Status: DC
  Administered 2024-05-11 – 2024-05-17 (×11): 1 via ORAL
  Filled 2024-05-11 (×11): qty 1

## 2024-05-11 MED ORDER — FAMOTIDINE 20 MG PO TABS
20.0000 mg | ORAL_TABLET | Freq: Every day | ORAL | Status: DC
  Administered 2024-05-11 – 2024-05-17 (×7): 20 mg via ORAL
  Filled 2024-05-11 (×7): qty 1

## 2024-05-11 MED ORDER — BISACODYL 10 MG RE SUPP
10.0000 mg | Freq: Once | RECTAL | Status: AC
  Administered 2024-05-11: 10 mg via RECTAL
  Filled 2024-05-11: qty 1

## 2024-05-11 MED ORDER — METOPROLOL SUCCINATE ER 50 MG PO TB24
100.0000 mg | ORAL_TABLET | Freq: Every day | ORAL | Status: DC
  Administered 2024-05-12 – 2024-05-17 (×4): 100 mg via ORAL
  Filled 2024-05-11 (×5): qty 2

## 2024-05-11 MED ORDER — TRAZODONE HCL 50 MG PO TABS
50.0000 mg | ORAL_TABLET | Freq: Every evening | ORAL | Status: DC | PRN
  Administered 2024-05-12 – 2024-05-16 (×5): 50 mg via ORAL
  Filled 2024-05-11 (×5): qty 1

## 2024-05-11 MED ORDER — METOPROLOL SUCCINATE ER 50 MG PO TB24
50.0000 mg | ORAL_TABLET | Freq: Once | ORAL | Status: AC
  Administered 2024-05-11: 50 mg via ORAL
  Filled 2024-05-11: qty 1

## 2024-05-11 NOTE — Progress Notes (Signed)
   05/11/24 0917  Assess: MEWS Score  Temp 98.4 F (36.9 C)  BP 108/76  MAP (mmHg) 85  Pulse Rate (!) 140  Resp 16  SpO2 99 %  O2 Device Room Air  Assess: MEWS Score  MEWS Temp 0  MEWS Systolic 0  MEWS Pulse 3  MEWS RR 0  MEWS LOC 0  MEWS Score 3  MEWS Score Color Yellow  Assess: if the MEWS score is Yellow or Red  Were vital signs accurate and taken at a resting state? Yes  Does the patient meet 2 or more of the SIRS criteria? No  MEWS guidelines implemented  Yes, yellow  Treat  MEWS Interventions Considered administering scheduled or prn medications/treatments as ordered  Take Vital Signs  Increase Vital Sign Frequency  Yellow: Q2hr x1, continue Q4hrs until patient remains green for 12hrs  Escalate  MEWS: Escalate Yellow: Discuss with charge nurse and consider notifying provider and/or RRT  Notify: Charge Nurse/RN  Name of Charge Nurse/RN Notified Investment banker, operational  Provider Notification  Provider Name/Title Ghimire MD  Date Provider Notified 05/11/24  Time Provider Notified 1134  Method of Notification Page (secure chat)  Notification Reason Change in status;Other (Comment) (Yellow MEWS)  Provider response See new orders  Assess: SIRS CRITERIA  SIRS Temperature  0  SIRS Respirations  0  SIRS Pulse 1  SIRS WBC 0  SIRS Score Sum  1

## 2024-05-11 NOTE — Progress Notes (Signed)
 Mobility Specialist Progress Note:   05/11/24 1118  Mobility  Activity Ambulated with assistance in hallway  Level of Assistance Contact guard assist, steadying assist  Assistive Device Front wheel walker  Distance Ambulated (ft) 100 ft  LLE Weight Bearing Per Provider Order WBAT  Activity Response Tolerated well  Mobility Referral Yes  Mobility visit 1 Mobility  Mobility Specialist Start Time (ACUTE ONLY) 1018  Mobility Specialist Stop Time (ACUTE ONLY) 1030  Mobility Specialist Time Calculation (min) (ACUTE ONLY) 12 min   Received pt EOB and agreeable for mobility. No c/o throughout. Returned to room without fault and left pt in chair. RN present.  Lavanda Pollack Mobility Specialist  Please contact via [employer] or  Rehab Office [phone]

## 2024-05-11 NOTE — Progress Notes (Signed)
 s/p Lt intramedullary rod and nail 7/16.Hx of NSTEMI /stent in 2021, HTN, HLD, GERD, and chronic low back pain    05/11/24 1258  TOC Brief Assessment  Insurance and Status Reviewed  Patient has primary care physician Yes  Home environment has been reviewed From home alone. States has supportive family.  Prior level of function: PTA independent with ADL's, no DME usage  Prior/Current Home Services No current home services  Social Drivers of Health Review SDOH reviewed no interventions necessary  Readmission risk has been reviewed No  Transition of care needs transition of care needs identified, TOC will continue to follow   Pt with DME RW @ home. States not interested in home health services 2/2 dog in home will bite. States his family will assist with care if needed once discharged. Pt without transportation issues. Family to provide transportation to home once d/c ready. TOC team following and will assist with needs.SABRASABRA

## 2024-05-11 NOTE — Progress Notes (Signed)
 Physical Therapy Treatment Patient Details Name: Cody Preston MRN: [id number] DOB: 09-Sep-1966 Today's Date: 05/11/2024   History of Present Illness Cody Preston is a 58 y.o. male who presented to ED 05/08/24 following ground level fall at a gas station. X-ray revealed a left femoral neck fracture. Pt s/p Lt intramedullary rod and nail 7/16. PMHx: NSTEMI, stent in 2021, HTN, HLD, GERD, and chronic low back pain.    PT Comments  Pt seated up on EOB on arrival, agreeable to session and demonstrating slow progress towards acute goals as well as poor insight into current deficits and safety. Pt needing min A to power up to stand to RW with good hand placement and demonstrating gait with RW support with grossly CGA for safety without overt LOB. Attempting stair training however pt unable to bring RLE up to step due to LLE weakness. Pt able to shift weight to L but unable to lift R foot to step up, despite good clearance of R during ambulation. Max education given on safety with mobility, however pt stating he wants to call a cab to leave and can just crawl up his 10 steps at home. Plan to continue stair training next date, will continue to follow acutely.    If plan is discharge home, recommend the following: A little help with walking and/or transfers;A little help with bathing/dressing/bathroom;Assistance with cooking/housework;Assist for transportation;Help with stairs or ramp for entrance   Can travel by private vehicle        Equipment Recommendations  Rolling walker (2 wheels)    Recommendations for Other Services       Precautions / Restrictions Precautions Precautions: Fall Recall of Precautions/Restrictions: Intact Restrictions Weight Bearing Restrictions Per Provider Order: Yes LLE Weight Bearing Per Provider Order: Weight bearing as tolerated     Mobility  Bed Mobility Overal bed mobility: Needs Assistance             General bed mobility comments: pt seated up on EOB on  arrival and in chair at end of session    Transfers Overall transfer level: Needs assistance Equipment used: Rolling walker (2 wheels) Transfers: Sit to/from Stand, Bed to chair/wheelchair/BSC Sit to Stand: Min assist           General transfer comment: pt with good hand placement on EOB, need min A to power up to stand    Ambulation/Gait Ambulation/Gait assistance: Contact guard assist Gait Distance (Feet): 100 Feet Assistive device: Rolling walker (2 wheels) Gait Pattern/deviations: Decreased stride length, Decreased weight shift to left, Antalgic, Trunk flexed, Step-to pattern Gait velocity: decreased     General Gait Details: pt with short step-to pattern with narrow BOS. cues for upright posture and to keep hands on RW as pt resting R forearm on RW with fatigue. no overt LOB   Stairs Stairs: Yes Stairs assistance: Total assist Stair Management: Two rails Number of Stairs: 0 General stair comments: pt unable to negotitate step in therapy gym this session, pt with good recall for sequencing, pt resting forearms on rails, stating he feels like he cant get right leg to do what i am telling it, pt shifting weight in place but unwilling to attmempt to step R foot up to step   Wheelchair Mobility     Tilt Bed    Modified Rankin (Stroke Patients Only)       Balance Overall balance assessment: Needs assistance, History of Falls Sitting-balance support: Bilateral upper extremity supported, Feet supported Sitting balance-Leahy Scale: Fair Sitting balance -  Comments: Pt sat EOB with supervision.   Standing balance support: Bilateral upper extremity supported, During functional activity, Reliant on assistive device for balance Standing balance-Leahy Scale: Poor Standing balance comment: Pt dependent on RW                            Communication Communication Communication: No apparent difficulties  Cognition Arousal: Alert Behavior During Therapy: WFL  for tasks assessed/performed   PT - Cognitive impairments: Problem solving, Safety/Judgement                       PT - Cognition Comments: pt with very poor insight into deficits, stating he is going to leave and call a cab to go to his house with 10 STE, unable to ascend single step in gym, pt stating he will jsut crawl. max education given on importance of remaining inpatient and continuing therapies for safety Following commands: Intact      Cueing Cueing Techniques: Verbal cues, Visual cues, Tactile cues  Exercises      General Comments        Pertinent Vitals/Pain Pain Assessment Pain Assessment: Faces Faces Pain Scale: Hurts little more Pain Location: Lt hip Pain Descriptors / Indicators: Guarding, Grimacing, Discomfort, Sharp Pain Intervention(s): Monitored during session, Limited activity within patient's tolerance    Home Living                          Prior Function            PT Goals (current goals can now be found in the care plan section) Acute Rehab PT Goals Patient Stated Goal: Return Home as soon as possible PT Goal Formulation: With patient Time For Goal Achievement: 05/24/24 Progress towards PT goals: Progressing toward goals    Frequency    Min 2X/week      PT Plan      Co-evaluation              AM-PAC PT 6 Clicks Mobility   Outcome Measure  Help needed turning from your back to your side while in a flat bed without using bedrails?: A Little Help needed moving from lying on your back to sitting on the side of a flat bed without using bedrails?: A Little Help needed moving to and from a bed to a chair (including a wheelchair)?: A Little Help needed standing up from a chair using your arms (e.g., wheelchair or bedside chair)?: A Little Help needed to walk in hospital room?: A Little Help needed climbing 3-5 steps with a railing? : Total 6 Click Score: 16    End of Session Equipment Utilized During  Treatment: Gait belt Activity Tolerance: Patient tolerated treatment well Patient left: in chair;with call bell/phone within reach;with chair alarm set;Other (comment) (with breakfast tray set up) Nurse Communication: Mobility status;Other (comment) (pt stating he wants to leave AMA and call cab) PT Visit Diagnosis: Difficulty in walking, not elsewhere classified (R26.2);Unsteadiness on feet (R26.81);History of falling (Z91.81)     Time: 9256-9193 PT Time Calculation (min) (ACUTE ONLY): 23 min  Charges:    $Gait Training: 23-37 mins PT General Charges $$ ACUTE PT VISIT: 1 Visit                     Elaine Middleton R. PTA Acute Rehabilitation Services Office: [phone]   Therisa CHRISTELLA Boor 05/11/2024, 8:21 AM

## 2024-05-11 NOTE — Plan of Care (Signed)
  Problem: Clinical Measurements: Goal: Ability to maintain clinical measurements within normal limits will improve Outcome: Progressing Goal: Will remain free from infection Outcome: Progressing Goal: Diagnostic test results will improve Outcome: Progressing Goal: Respiratory complications will improve Outcome: Progressing Goal: Cardiovascular complication will be avoided Outcome: Progressing   Problem: Activity: Goal: Risk for activity intolerance will decrease Outcome: Progressing   Problem: Pain Managment: Goal: General experience of comfort will improve and/or be controlled Outcome: Progressing

## 2024-05-11 NOTE — Progress Notes (Signed)
 PROGRESS NOTE    Cody Preston  FMW:[id number] DOB: [birth year] DOA: 05/08/2024 PCP: Leigh Lung, MD    Brief Narrative:  58 year old with history of non-STEMI, coronary stent in 2021, hypertension, hyperlipidemia, GERD, chronic low back pain presented with mechanical fall and left hip pain.  He was found to have left femoral neck fracture.  Admitted with orthopedic consultation.  Hemodynamically stable in the ER.  Also seen by cardiology.  Subjective: Patient seen and examined.  He has episodic pain and using oral pain medications.  No bowel movements yet.  Denies any chest pain or palpitations.  Monitor shows sinus tachycardia and as high as 140 even after giving morning dose of metoprolol .  Assessment & Plan:   Posttraumatic left hip fracture: Status post left IM nail, Dr. Sharl 7/16.   Adequate oral and IV opiates for pain control.  Also use stool softener and laxatives. Weightbearing as tolerated. Aspirin  81 mg twice daily for 6 weeks, patient is already on 81 mg daily. Mobilize with PT OT.   Home with home health PT OT.  Anticipate tomorrow.  Coronary artery disease: Currently no evidence of recurrence.  Continued on aspirin , beta-blockers and statins.  Remains tachycardic.  Some of that is aggravated by pain.  Additional Toprol  XL 50 mg now, increase dose of Toprol -XL to 100 mg daily.  Hypertension: Stable on beta-blockers.  Hyperlipidemia: On statin.  Hyponatremia: Chronic.   DVT prophylaxis: enoxaparin  (LOVENOX ) injection 40 mg Start: 05/10/24 0800 SCDs Start: 05/09/24 2040SCDs   Code Status: Full code Family Communication: None at bedside Disposition Plan: Status is: Inpatient Remains inpatient appropriate because: Immediate postop.  Mobility improvement.  Tachycardia.     Consultants:  Orthopedics Cardiology  Procedures:  ORIF with IM nail, left hip.  Antimicrobials:  None     Objective: Vitals:   05/10/24 1935 05/11/24 0417 05/11/24 0917 05/11/24  1137  BP: 120/85 113/80 108/76 107/79  Pulse:   (!) 140 (!) 135  Resp: 15 16 16 16   Temp: 97.6 F (36.4 C) 98.7 F (37.1 C) 98.4 F (36.9 C)   TempSrc: Oral Oral Oral   SpO2: 100% 99% 99% 97%  Weight:      Height:        Intake/Output Summary (Last 24 hours) at 05/11/2024 1314 Last data filed at 05/11/2024 0900 Gross per 24 hour  Intake 840 ml  Output 550 ml  Net 290 ml   Filed Weights   05/08/24 1511 05/09/24 1530  Weight: 68.9 kg 67.9 kg    Examination:  General exam: Appeared in slightly pain and mild distress after walking. Respiratory system: Clear to auscultation. Respiratory effort normal. Cardiovascular system: S1 & S2 heard, RRR. No pedal edema.  Tachycardic. Gastrointestinal system: Abdomen is nondistended, soft and nontender. No organomegaly or masses felt. Normal bowel sounds heard. Central nervous system: Alert and oriented. No focal neurological deficits. Extremities: Symmetric 5 x 5 power. Left hip lateral thigh incision clean and dry.  Distal neurovascular status intact.    Data Reviewed: I have personally reviewed following labs and imaging studies  CBC: Recent Labs  Lab 05/08/24 1609 05/09/24 0832 05/10/24 0545 05/11/24 0858  WBC 8.2 12.1* 12.0* 10.2  NEUTROABS 6.5  --   --  6.9  HGB 13.2 12.7* 11.3* 11.2*  HCT 40.5 38.9* 33.7* 33.8*  MCV 83.5 82.2 81.8 81.6  PLT 356 346 312 316   Basic Metabolic Panel: Recent Labs  Lab 05/08/24 1609 05/09/24 0832 05/10/24 0800 05/11/24 0858  NA 131*  128* 127* 129*  K 4.7 4.0 4.0 3.8  CL 99 95* 94* 94*  CO2 25 24 23 24   GLUCOSE 109* 121* 156* 147*  BUN 11 11 11 14   CREATININE 0.94 0.80 0.82 0.96  CALCIUM  8.5* 8.7* 8.8* 9.1  MG  --   --  1.8  --   PHOS  --   --  2.7  --    GFR: Estimated Creatinine Clearance: 77.5 mL/min (by C-G formula based on SCr of 0.96 mg/dL). Liver Function Tests: No results for input(s): AST, ALT, ALKPHOS, BILITOT, PROT, ALBUMIN in the last 168 hours. No  results for input(s): LIPASE, AMYLASE in the last 168 hours. No results for input(s): AMMONIA in the last 168 hours. Coagulation Profile: No results for input(s): INR, PROTIME in the last 168 hours. Cardiac Enzymes: No results for input(s): CKTOTAL, CKMB, CKMBINDEX, TROPONINI in the last 168 hours. BNP (last 3 results) No results for input(s): PROBNP in the last 8760 hours. HbA1C: No results for input(s): HGBA1C in the last 72 hours. CBG: No results for input(s): GLUCAP in the last 168 hours. Lipid Profile: No results for input(s): CHOL, HDL, LDLCALC, TRIG, CHOLHDL, LDLDIRECT in the last 72 hours. Thyroid  Function Tests: No results for input(s): TSH, T4TOTAL, FREET4, T3FREE, THYROIDAB in the last 72 hours. Anemia Panel: No results for input(s): VITAMINB12, FOLATE, FERRITIN, TIBC, IRON, RETICCTPCT in the last 72 hours. Sepsis Labs: No results for input(s): PROCALCITON, LATICACIDVEN in the last 168 hours.  Recent Results (from the past 240 hours)  MRSA Next Gen by PCR, Nasal     Status: Abnormal   Collection Time: 05/09/24  5:13 PM   Specimen: Nasal Mucosa; Nasal Swab  Result Value Ref Range Status   MRSA by PCR Next Gen DETECTED (A) NOT DETECTED Final    Comment: RESULT CALLED TO, READ BACK BY AND VERIFIED WITH: RN DRISS ZINE [phone] FCP (NOTE) The GeneXpert MRSA Assay (FDA approved for NASAL specimens only), is one component of a comprehensive MRSA colonization surveillance program. It is not intended to diagnose MRSA infection nor to guide or monitor treatment for MRSA infections. Test performance is not FDA approved in patients less than 62 years old. Performed at [facility], [address], [place], [place] [address]          Radiology Studies: DG HIP UNILAT WITH PELVIS 2-3 VIEWS LEFT Result Date: 05/09/2024 CLINICAL DATA:  Known left femoral fracture EXAM: DG HIP (WITH OR WITHOUT PELVIS) 2-3V  LEFT COMPARISON:  05/08/2024 FLUOROSCOPY TIME:  Radiation Exposure Index (as provided by the fluoroscopic device): 5.97 mGy If the device does not provide the exposure index: Fluoroscopy Time:  59 seconds Number of Acquired Images:  5 FINDINGS: Initial images demonstrate medullary rod within the proximal left femur. Fixation screw was then placed traversing the femoral neck. Distal fixation screw in the proximal femoral shaft is noted as well. Fracture fragments are in near anatomic alignment. IMPRESSION: ORIF of proximal left femoral fracture. Electronically Signed   By: Oneil Devonshire M.D.   On: 05/09/2024 21:40   DG C-Arm 1-60 Min-No Report Result Date: 05/09/2024 Fluoroscopy was utilized by the requesting physician.  No radiographic interpretation.   ECHOCARDIOGRAM COMPLETE Result Date: 05/09/2024    ECHOCARDIOGRAM REPORT   Patient Name:   MARLYN RABINE Date of Exam: 05/09/2024 Medical Rec #:  [id number]   Height:       66.0 in Accession #:    [id number]  Weight:  152.0 lb Date of Birth:  1966/06/15   BSA:          1.780 m Patient Age:    57 years    BP:           119/92 mmHg Patient Gender: M           HR:           109 bpm. Exam Location:  Inpatient Procedure: 2D Echo, Color Doppler and Cardiac Doppler (Both Spectral and Color            Flow Doppler were utilized during procedure). Indications:    I25.5 Ischemic cardiomyopathy, Pre-op  History:        Patient has prior history of Echocardiogram examinations, most                 recent 04/20/2020. CAD; Risk Factors:Hypertension and                 Dyslipidemia.  Sonographer:    Damien Senior RDCS Referring Phys: [id number] ZANE ADAMS  Sonographer Comments: Patient laying towards right side, unable to reposition due to hip fracture IMPRESSIONS  1. Left ventricular ejection fraction, by estimation, is 50 to 55%. The left ventricle has low normal function. The left ventricle demonstrates global hypokinesis. Left ventricular diastolic parameters are  indeterminate.  2. Right ventricular systolic function is normal. The right ventricular size is normal.  3. The mitral valve is normal in structure. No evidence of mitral valve regurgitation. No evidence of mitral stenosis.  4. The aortic valve is normal in structure. Aortic valve regurgitation is not visualized. No aortic stenosis is present.  5. The inferior vena cava is normal in size with greater than 50% respiratory variability, suggesting right atrial pressure of 3 mmHg. FINDINGS  Left Ventricle: Left ventricular ejection fraction, by estimation, is 50 to 55%. The left ventricle has low normal function. The left ventricle demonstrates global hypokinesis. The left ventricular internal cavity size was normal in size. There is no left ventricular hypertrophy. Left ventricular diastolic parameters are indeterminate. Right Ventricle: The right ventricular size is normal. No increase in right ventricular wall thickness. Right ventricular systolic function is normal. Left Atrium: Left atrial size was normal in size. Right Atrium: Right atrial size was normal in size. Pericardium: There is no evidence of pericardial effusion. Mitral Valve: The mitral valve is normal in structure. No evidence of mitral valve regurgitation. No evidence of mitral valve stenosis. Tricuspid Valve: The tricuspid valve is normal in structure. Tricuspid valve regurgitation is not demonstrated. No evidence of tricuspid stenosis. Aortic Valve: The aortic valve is normal in structure. Aortic valve regurgitation is not visualized. No aortic stenosis is present. Pulmonic Valve: The pulmonic valve was normal in structure. Pulmonic valve regurgitation is not visualized. No evidence of pulmonic stenosis. Aorta: The aortic root is normal in size and structure. Venous: The inferior vena cava is normal in size with greater than 50% respiratory variability, suggesting right atrial pressure of 3 mmHg. IAS/Shunts: No atrial level shunt detected by color  flow Doppler.  LEFT VENTRICLE PLAX 2D LVIDd:         3.90 cm LVIDs:         2.50 cm LV PW:         0.80 cm LV IVS:        0.80 cm LVOT diam:     2.20 cm LV SV:         57 LV SV Index:   32  LVOT Area:     3.80 cm  LV Volumes (MOD) LV vol d, MOD A2C: 63.6 ml LV vol s, MOD A2C: 34.5 ml LV SV MOD A2C:     29.1 ml RIGHT VENTRICLE RV S prime:     11.60 cm/s TAPSE (M-mode): 1.6 cm LEFT ATRIUM             Index        RIGHT ATRIUM          Index LA diam:        3.30 cm 1.85 cm/m   RA Area:     8.59 cm LA Vol (A2C):   37.1 ml 20.85 ml/m  RA Volume:   12.90 ml 7.25 ml/m LA Vol (A4C):   38.1 ml 21.41 ml/m LA Biplane Vol: 38.7 ml 21.75 ml/m  AORTIC VALVE LVOT Vmax:   84.60 cm/s LVOT Vmean:  63.300 cm/s LVOT VTI:    0.149 m  AORTA Ao Root diam: 3.30 cm Ao Asc diam:  3.10 cm  SHUNTS Systemic VTI:  0.15 m Systemic Diam: 2.20 cm Kardie Tobb DO Electronically signed by Kardie Tobb DO Signature Date/Time: 05/09/2024/3:06:04 PM    Final         Scheduled Meds:  aspirin   81 mg Oral BID   docusate sodium   100 mg Oral BID   enoxaparin  (LOVENOX ) injection  40 mg Subcutaneous Q24H   famotidine  20 mg Oral Daily   [START ON 05/12/2024] metoprolol  succinate  100 mg Oral Daily   mupirocin  ointment   Nasal BID   pantoprazole   40 mg Oral Daily   rosuvastatin   20 mg Oral Daily   senna-docusate  1 tablet Oral BID   Continuous Infusions:     LOS: 3 days    Time spent: 40 minutes    Renato Applebaum, MD [facility]

## 2024-05-12 DIAGNOSIS — I2510 Atherosclerotic heart disease of native coronary artery without angina pectoris: Secondary | ICD-10-CM | POA: Diagnosis not present

## 2024-05-12 LAB — TSH: TSH: 0.534 u[IU]/mL (ref 0.350–4.500)

## 2024-05-12 MED ORDER — BISACODYL 10 MG RE SUPP
10.0000 mg | Freq: Once | RECTAL | Status: AC
  Administered 2024-05-12: 10 mg via RECTAL
  Filled 2024-05-12: qty 1

## 2024-05-12 MED ORDER — POLYETHYLENE GLYCOL 3350 17 G PO PACK
17.0000 g | PACK | Freq: Every day | ORAL | Status: DC
  Administered 2024-05-12: 17 g via ORAL
  Filled 2024-05-12: qty 1

## 2024-05-12 MED ORDER — POLYETHYLENE GLYCOL 3350 17 G PO PACK
17.0000 g | PACK | Freq: Two times a day (BID) | ORAL | Status: DC
  Administered 2024-05-13 – 2024-05-16 (×7): 17 g via ORAL
  Filled 2024-05-12 (×8): qty 1

## 2024-05-12 NOTE — Progress Notes (Signed)
 Physical Therapy Treatment Patient Details Name: Cody Preston MRN: [id number] DOB: Dec 15, 1965 Today's Date: 05/12/2024   History of Present Illness Cody Preston is a 58 y.o. male who presented to ED 05/08/24 following ground level fall at a gas station. X-ray revealed a left femoral neck fracture. Pt s/p Lt intramedullary rod and nail 7/16. PMHx: NSTEMI, stent in 2021, HTN, HLD, GERD, and chronic low back pain.    PT Comments  Pt received in chair, NT present in room making his bed, pt agreeable to therapy session but somewhat self-limiting activity due c/o severe pain, RN notified but had not arrived to give pain meds by end of session. Pt needing up to modA to stand from chair without arm rests and up to maxA for stepping up/down single 7 platform step in room with RW to simulate bil railings. Per chart review, pt has 10 STE home. Pt with unsafe technique for ambulation as he fatigued and needing consistent minA for improved LLE placement/step sequencing as he tends to let go of RW with RUE and reach down toward LLE to push his leg forward with poor hip flexion and limited L ankle dorsiflexion. Recommend pt consider LLE AFO or foot up brace to see if this improves his L foot clearance but pt not willing to consider trying brace. Recommend use of ice PRN for L hip/thigh pain, pt unsure, ice pack left next to him to try at end of session, RN notified pt reporting 10/10 pain after short household distance gait trial. Patient will benefit from continued inpatient follow up therapy, <3 hours/day as he has not yet been able to perform stairs/gait safely with +1 assist and has either 2 STE sister's home or 10 STE his own home.   If plan is discharge home, recommend the following: A little help with bathing/dressing/bathroom;Assistance with cooking/housework;Assist for transportation;Help with stairs or ramp for entrance;A lot of help with walking and/or transfers   Can travel by private vehicle         Equipment Recommendations  Rolling walker (2 wheels) (recommend stair lift or ramp for home)    Recommendations for Other Services       Precautions / Restrictions Precautions Precautions: Fall Recall of Precautions/Restrictions: Intact Precaution/Restrictions Comments: decreased L dorsiflexion and hip flexion, recommended foot up brace to help with this but pt defers Restrictions Weight Bearing Restrictions Per Provider Order: Yes LLE Weight Bearing Per Provider Order: Weight bearing as tolerated     Mobility  Bed Mobility Overal bed mobility: Needs Assistance Bed Mobility: Sit to Sidelying         Sit to sidelying: Mod assist, Used rails General bed mobility comments: Pt states it will take 10 mins but with cues for technique and increased assist, pt able to perform with heavy reliance on bed side rails and BLE assist over EOB    Transfers Overall transfer level: Needs assistance Equipment used: Rolling walker (2 wheels) Transfers: Sit to/from Stand, Bed to chair/wheelchair/BSC Sit to Stand: Min assist, Mod assist           General transfer comment: from chair without arm rests modA, from RW to EOB minA and cues for safety and proximity, pt ignoring some cues.    Ambulation/Gait Ambulation/Gait assistance: Min assist Gait Distance (Feet): 20 Feet Assistive device: Rolling walker (2 wheels) Gait Pattern/deviations: Decreased stride length, Decreased weight shift to left, Antalgic, Trunk flexed, Step-to pattern, Decreased dorsiflexion - left, Decreased stance time - left, Decreased step length - right,  Knee flexed in stance - left, Knee flexed in stance - right, Narrow base of support, Shuffle Gait velocity: decreased, grossly <0.1 m/s     General Gait Details: pt with short step-to pattern with narrow BOS. cues for upright posture and to keep hands on RW and to activate triceps/elbows more to offload L hip/femur as pt resting R forearm on RW with fatigue. no  overt LOB but unsafe technique. MinA for RW management at times and for LLE improved step length.   Stairs Stairs: Yes Stairs assistance: Max assist, +2 safety/equipment Stair Management: Two rails, Forwards, Backwards, Step to pattern Number of Stairs: 1 General stair comments: Single 7 step in room x1 rep, max cues for safety/sequencing. Pt performed LLE foot tap on step prior to attempting R foot step-up. PTA blocked his L knee and assisted to stabilize RW which was to simulate bil rails. Pt then able to step up with RLE but maintains flexed posture over bil rails despite PTA max cues for trunk extension and increased use of UE/elbow muscles to assist.   Wheelchair Mobility     Tilt Bed    Modified Rankin (Stroke Patients Only)       Balance Overall balance assessment: Needs assistance, History of Falls Sitting-balance support: Bilateral upper extremity supported, Feet supported Sitting balance-Leahy Scale: Fair Sitting balance - Comments: Pt sat EOB with supervision.   Standing balance support: Bilateral upper extremity supported, During functional activity, Reliant on assistive device for balance Standing balance-Leahy Scale: Poor Standing balance comment: Pt dependent on RW                            Communication Communication Communication: No apparent difficulties  Cognition Arousal: Alert Behavior During Therapy: Anxious, Flat affect   PT - Cognitive impairments: Problem solving, Safety/Judgement, Sequencing                       PT - Cognition Comments: pt with very poor insight into deficits and not receptive to PTA instruction on safer techniques to allow him to succeed with mobility goals. Following commands: Intact      Cueing Cueing Techniques: Verbal cues, Visual cues, Tactile cues  Exercises Other Exercises Other Exercises: LLE AAROM for LAQ, pt performs ~3 reps then defers more due to pain    General Comments         Pertinent Vitals/Pain Pain Assessment Pain Assessment: 0-10 Pain Score: 10-Worst pain ever Faces Pain Scale: Hurts even more Pain Location: L hip/thigh/knee Pain Descriptors / Indicators: Guarding, Grimacing, Discomfort, Sharp, Moaning Pain Intervention(s): Limited activity within patient's tolerance, Monitored during session, Premedicated before session, Repositioned, Patient requesting pain meds-RN notified, Ice applied (ice offered and left in bed, pt still adjusting his posture and pt encouraged to use on hip for his pain as needed)    Home Living                          Prior Function            PT Goals (current goals can now be found in the care plan section) Acute Rehab PT Goals Patient Stated Goal: Return Home as soon as possible PT Goal Formulation: With patient Time For Goal Achievement: 05/24/24 Progress towards PT goals: Progressing toward goals    Frequency    Min 2X/week      PT Plan  Co-evaluation              AM-PAC PT 6 Clicks Mobility   Outcome Measure  Help needed turning from your back to your side while in a flat bed without using bedrails?: A Little Help needed moving from lying on your back to sitting on the side of a flat bed without using bedrails?: A Little Help needed moving to and from a bed to a chair (including a wheelchair)?: A Little Help needed standing up from a chair using your arms (e.g., wheelchair or bedside chair)?: A Lot Help needed to walk in hospital room?: A Little Help needed climbing 3-5 steps with a railing? : Total 6 Click Score: 15    End of Session Equipment Utilized During Treatment: Gait belt Activity Tolerance: Patient tolerated treatment well;Patient limited by pain Patient left: with call bell/phone within reach;Other (comment);in bed (RN notified PTA can't set bed alarm as he's still scooting toward middle of bed, pt refusing to roll toward middle of bed until he gets pain meds) Nurse  Communication: Mobility status;Other (comment);Patient requests pain meds (pain score, pt needs bed alarm set) PT Visit Diagnosis: Difficulty in walking, not elsewhere classified (R26.2);Unsteadiness on feet (R26.81);History of falling (Z91.81)     Time: 8197-8174 PT Time Calculation (min) (ACUTE ONLY): 23 min  Charges:    $Gait Training: 8-22 mins $Therapeutic Activity: 8-22 mins PT General Charges $$ ACUTE PT VISIT: 1 Visit                     Mariadelosang Wynns P., PTA Acute Rehabilitation Services Secure Chat Preferred 9a-5:30pm Office: [phone]    Connell HERO [facility] 05/12/2024, 7:08 PM

## 2024-05-12 NOTE — Plan of Care (Signed)

## 2024-05-12 NOTE — Progress Notes (Signed)
 PROGRESS NOTE    Cody Preston  FMW:[id number] DOB: [birth year] DOA: 05/08/2024 PCP: Leigh Lung, MD    Brief Narrative:  58 year old with history of non-STEMI, coronary stent in 2021, hypertension, hyperlipidemia, GERD, chronic low back pain presented with mechanical fall and left hip pain.  He was found to have left femoral neck fracture.  Admitted with orthopedic consultation.  Hemodynamically stable in the ER.  Also seen by cardiology.  Subjective:  Patient seen and examined.  No bowel movement yet despite Dulcolax suppository.  He has previous back pain issues and difficulty dealing with constipation. He wanted to go home, I asked him to walk back from bathroom to the chair and he could not tolerate and I had to help him.  He has 9 steps on the front of the house and 3 steps on the back of the house.  Agreed to stay back for more therapies. Does not want physical therapist to come home because his dogs bite.  He is agreeable to outpatient therapy referrals. Heart rate remains 120-130.  I discussed with cardiology who recommended to keep on 100 mg of and monitor and also check for TSH.  Assessment & Plan:   Posttraumatic left hip fracture: Status post left IM nail, Dr. Sharl 7/16.   Adequate oral and IV opiates for pain control.  Also use stool softener and laxatives. Weightbearing as tolerated. Aspirin  81 mg twice daily for 6 weeks, patient is already on 81 mg daily. Mobilize with PT OT.   Not ready to discharge he is not tolerating mobility. Dulcolax suppository unsuccessful, soapsuds enema today.  Increase MiraLAX  to twice daily.  Coronary artery disease: Currently no evidence of recurrence.  Continued on aspirin , beta-blockers and statins.  Remains tachycardic.  Some of that is aggravated by pain.  Additional Toprol  XL 50 mg now, increase dose of Toprol -XL to 100 mg daily.  Will monitor.  Check TSH.  Hypertension: Stable on beta-blockers.  Hyperlipidemia: On  statin.  Hyponatremia: Chronic.   DVT prophylaxis: enoxaparin  (LOVENOX ) injection 40 mg Start: 05/10/24 0800 SCDs Start: 05/09/24 2040SCDs   Code Status: Full code Family Communication: None at bedside Disposition Plan: Status is: Inpatient Remains inpatient appropriate because: Tachycardic.  Needs more mobility before discharging home.   Consultants:  Orthopedics Cardiology  Procedures:  ORIF with IM nail, left hip.  Antimicrobials:  None     Objective: Vitals:   05/11/24 2000 05/12/24 0505 05/12/24 0737 05/12/24 0800  BP: 114/80 107/78 113/77   Pulse: (!) 102 100 (!) 131 (!) 123  Resp: 16 16 19    Temp: 97.8 F (36.6 C) 98.4 F (36.9 C) 98.7 F (37.1 C)   TempSrc: Oral Oral    SpO2: 98% 97% 98%   Weight:      Height:        Intake/Output Summary (Last 24 hours) at 05/12/2024 1113 Last data filed at 05/12/2024 0553 Gross per 24 hour  Intake --  Output 450 ml  Net -450 ml   Filed Weights   05/08/24 1511 05/09/24 1530  Weight: 68.9 kg 67.9 kg    Examination:  General exam: Appeared comfortable when standing, started getting pain and uncomfortable while walking back with walker. Respiratory system: Clear to auscultation. Respiratory effort normal. Cardiovascular system: S1 & S2 heard, RRR. No pedal edema.  Tachycardic. Gastrointestinal system: Abdomen is nondistended, soft and nontender. No organomegaly or masses felt. Normal bowel sounds heard. Central nervous system: Alert and oriented. No focal neurological deficits. Extremities: Symmetric 5 x  5 power. Left hip lateral thigh incision clean and dry.  Distal neurovascular status intact.    Data Reviewed: I have personally reviewed following labs and imaging studies  CBC: Recent Labs  Lab 05/08/24 1609 05/09/24 0832 05/10/24 0545 05/11/24 0858  WBC 8.2 12.1* 12.0* 10.2  NEUTROABS 6.5  --   --  6.9  HGB 13.2 12.7* 11.3* 11.2*  HCT 40.5 38.9* 33.7* 33.8*  MCV 83.5 82.2 81.8 81.6  PLT 356 346  312 316   Basic Metabolic Panel: Recent Labs  Lab 05/08/24 1609 05/09/24 0832 05/10/24 0800 05/11/24 0858  NA 131* 128* 127* 129*  K 4.7 4.0 4.0 3.8  CL 99 95* 94* 94*  CO2 25 24 23 24   GLUCOSE 109* 121* 156* 147*  BUN 11 11 11 14   CREATININE 0.94 0.80 0.82 0.96  CALCIUM  8.5* 8.7* 8.8* 9.1  MG  --   --  1.8  --   PHOS  --   --  2.7  --    GFR: Estimated Creatinine Clearance: 77.5 mL/min (by C-G formula based on SCr of 0.96 mg/dL). Liver Function Tests: No results for input(s): AST, ALT, ALKPHOS, BILITOT, PROT, ALBUMIN in the last 168 hours. No results for input(s): LIPASE, AMYLASE in the last 168 hours. No results for input(s): AMMONIA in the last 168 hours. Coagulation Profile: No results for input(s): INR, PROTIME in the last 168 hours. Cardiac Enzymes: No results for input(s): CKTOTAL, CKMB, CKMBINDEX, TROPONINI in the last 168 hours. BNP (last 3 results) No results for input(s): PROBNP in the last 8760 hours. HbA1C: No results for input(s): HGBA1C in the last 72 hours. CBG: No results for input(s): GLUCAP in the last 168 hours. Lipid Profile: No results for input(s): CHOL, HDL, LDLCALC, TRIG, CHOLHDL, LDLDIRECT in the last 72 hours. Thyroid  Function Tests: No results for input(s): TSH, T4TOTAL, FREET4, T3FREE, THYROIDAB in the last 72 hours. Anemia Panel: No results for input(s): VITAMINB12, FOLATE, FERRITIN, TIBC, IRON, RETICCTPCT in the last 72 hours. Sepsis Labs: No results for input(s): PROCALCITON, LATICACIDVEN in the last 168 hours.  Recent Results (from the past 240 hours)  MRSA Next Gen by PCR, Nasal     Status: Abnormal   Collection Time: 05/09/24  5:13 PM   Specimen: Nasal Mucosa; Nasal Swab  Result Value Ref Range Status   MRSA by PCR Next Gen DETECTED (A) NOT DETECTED Final    Comment: RESULT CALLED TO, READ BACK BY AND VERIFIED WITH: RN DRISS ZINE [id number] FCP (NOTE) The  GeneXpert MRSA Assay (FDA approved for NASAL specimens only), is one component of a comprehensive MRSA colonization surveillance program. It is not intended to diagnose MRSA infection nor to guide or monitor treatment for MRSA infections. Test performance is not FDA approved in patients less than 32 years old. Performed at [facility], [address] [place], [place] [address]          Radiology Studies: No results found.       Scheduled Meds:  aspirin   81 mg Oral BID   docusate sodium   100 mg Oral BID   enoxaparin  (LOVENOX ) injection  40 mg Subcutaneous Q24H   famotidine   20 mg Oral Daily   metoprolol  succinate  100 mg Oral Daily   mupirocin  ointment   Nasal BID   pantoprazole   40 mg Oral Daily   polyethylene glycol  17 g Oral BID   rosuvastatin   20 mg Oral Daily   senna-docusate  1 tablet Oral  BID   Continuous Infusions:     LOS: 4 days    Time spent: 40 minutes    Renato Applebaum, MD [facility]

## 2024-05-13 DIAGNOSIS — I2510 Atherosclerotic heart disease of native coronary artery without angina pectoris: Secondary | ICD-10-CM | POA: Diagnosis not present

## 2024-05-13 NOTE — Progress Notes (Signed)
 Mobility Specialist: Progress Note   05/13/24 1427  Mobility  Activity Ambulated with assistance in hallway  Level of Assistance Contact guard assist, steadying assist  Assistive Device Front wheel walker  Distance Ambulated (ft) 100 ft  LLE Weight Bearing Per Provider Order WBAT  Activity Response Tolerated well  Mobility Referral Yes  Mobility visit 1 Mobility  Mobility Specialist Start Time (ACUTE ONLY) 1145  Mobility Specialist Stop Time (ACUTE ONLY) 1204  Mobility Specialist Time Calculation (min) (ACUTE ONLY) 19 min    Pt received in bed, agreeable to mobility session. Slow moving but no physical assist needed during session. CGA for safety. C/o bil elbow pain from RW use. Returned to room without fault. Left in chair with all needs met, call bell in reach.   Ileana Lute Mobility Specialist Please contact via SecureChat or Rehab office at [phone]

## 2024-05-13 NOTE — Progress Notes (Signed)
 PROGRESS NOTE    Cody Preston  FMW:[id number] DOB: May 08, 1966 DOA: 05/08/2024 PCP: Leigh Lung, MD    Brief Narrative:  58 year old with history of non-STEMI, coronary stent in 2021, hypertension, hyperlipidemia, GERD, chronic low back pain presented with mechanical fall and left hip pain.  He was found to have left femoral neck fracture.  Admitted with orthopedic consultation.  Hemodynamically stable in the ER.  Also seen by cardiology.  Subjective:  Patient seen and examined.  He had a bowel movement after enema yesterday and feels much better now.  Patient is still having difficulty mobilizing around.  He has not used the stairs.  He has stairs to go to home. He does not want to go to a SNF. He wants to go home, by tomorrow he may have some family members that can help him.  Assessment & Plan:   Posttraumatic left hip fracture: Status post left IM nail, Dr. Sharl 7/16.   Adequate oral and IV opiates for pain control.  Also use stool softener and laxatives. Weightbearing as tolerated. Aspirin  81 mg twice daily for 6 weeks, patient is already on 81 mg daily. Mobilize with PT OT.   Not ready to discharge he is not tolerating mobility.  Coronary artery disease: Currently no evidence of recurrence.  Continued on aspirin , beta-blockers and statins.  Remains tachycardic.  Some of that is aggravated by pain.   Toprol  XL dose increased to 100 mg daily.  Intermittently tachycardic but asymptomatic.  Will keep on current regimen.   Hypertension: Stable on beta-blockers.  Hyperlipidemia: On statin.  Hyponatremia: Chronic and stable.   DVT prophylaxis: enoxaparin  (LOVENOX ) injection 40 mg Start: 05/10/24 0800 SCDs Start: 05/09/24 2040SCDs   Code Status: Full code Family Communication: None at bedside Disposition Plan: Status is: Inpatient Remains inpatient appropriate because: Poor mobility.  Needs more mobility before discharging home.   Consultants:   Orthopedics Cardiology  Procedures:  ORIF with IM nail, left hip.  Antimicrobials:  None     Objective: Vitals:   05/12/24 1942 05/13/24 0422 05/13/24 0721 05/13/24 0923  BP: 125/86 116/68 95/74 101/76  Pulse: (!) 110 (!) 121 (!) 108   Resp: 17 18    Temp: 98.2 F (36.8 C) 98.5 F (36.9 C) (!) 97.5 F (36.4 C)   TempSrc: Oral Oral Oral   SpO2: 100% 100% 100%   Weight:      Height:        Intake/Output Summary (Last 24 hours) at 05/13/2024 1305 Last data filed at 05/13/2024 0900 Gross per 24 hour  Intake --  Output 500 ml  Net -500 ml   Filed Weights   05/08/24 1511 05/09/24 1530  Weight: 68.9 kg 67.9 kg    Examination:  General exam: Comfortable sitting in couch except episode of pain and spasm on his left leg. Respiratory system: Clear to auscultation. Respiratory effort normal. Cardiovascular system: S1 & S2 heard, RRR. No pedal edema.  Tachycardic. Gastrointestinal system: Abdomen is nondistended, soft and nontender. No organomegaly or masses felt. Normal bowel sounds heard. Central nervous system: Alert and oriented. No focal neurological deficits. Extremities: Symmetric 5 x 5 power. Left hip lateral thigh incision clean and dry.  Distal neurovascular status intact.    Data Reviewed: I have personally reviewed following labs and imaging studies  CBC: Recent Labs  Lab 05/08/24 1609 05/09/24 0832 05/10/24 0545 05/11/24 0858  WBC 8.2 12.1* 12.0* 10.2  NEUTROABS 6.5  --   --  6.9  HGB 13.2 12.7*  11.3* 11.2*  HCT 40.5 38.9* 33.7* 33.8*  MCV 83.5 82.2 81.8 81.6  PLT 356 346 312 316   Basic Metabolic Panel: Recent Labs  Lab 05/08/24 1609 05/09/24 0832 05/10/24 0800 05/11/24 0858  NA 131* 128* 127* 129*  K 4.7 4.0 4.0 3.8  CL 99 95* 94* 94*  CO2 25 24 23 24   GLUCOSE 109* 121* 156* 147*  BUN 11 11 11 14   CREATININE 0.94 0.80 0.82 0.96  CALCIUM  8.5* 8.7* 8.8* 9.1  MG  --   --  1.8  --   PHOS  --   --  2.7  --    GFR: Estimated  Creatinine Clearance: 77.5 mL/min (by C-G formula based on SCr of 0.96 mg/dL). Liver Function Tests: No results for input(s): AST, ALT, ALKPHOS, BILITOT, PROT, ALBUMIN in the last 168 hours. No results for input(s): LIPASE, AMYLASE in the last 168 hours. No results for input(s): AMMONIA in the last 168 hours. Coagulation Profile: No results for input(s): INR, PROTIME in the last 168 hours. Cardiac Enzymes: No results for input(s): CKTOTAL, CKMB, CKMBINDEX, TROPONINI in the last 168 hours. BNP (last 3 results) No results for input(s): PROBNP in the last 8760 hours. HbA1C: No results for input(s): HGBA1C in the last 72 hours. CBG: No results for input(s): GLUCAP in the last 168 hours. Lipid Profile: No results for input(s): CHOL, HDL, LDLCALC, TRIG, CHOLHDL, LDLDIRECT in the last 72 hours. Thyroid  Function Tests: Recent Labs    05/12/24 1043  TSH 0.534   Anemia Panel: No results for input(s): VITAMINB12, FOLATE, FERRITIN, TIBC, IRON, RETICCTPCT in the last 72 hours. Sepsis Labs: No results for input(s): PROCALCITON, LATICACIDVEN in the last 168 hours.  Recent Results (from the past 240 hours)  MRSA Next Gen by PCR, Nasal     Status: Abnormal   Collection Time: 05/09/24  5:13 PM   Specimen: Nasal Mucosa; Nasal Swab  Result Value Ref Range Status   MRSA by PCR Next Gen DETECTED (A) NOT DETECTED Final    Comment: RESULT CALLED TO, READ BACK BY AND VERIFIED WITH: RN DRISS ZINE [id number] FCP (NOTE) The GeneXpert MRSA Assay (FDA approved for NASAL specimens only), is one component of a comprehensive MRSA colonization surveillance program. It is not intended to diagnose MRSA infection nor to guide or monitor treatment for MRSA infections. Test performance is not FDA approved in patients less than 6 years old. Performed at [facility], [address] [place], [place] [address]          Radiology  Studies: No results found.       Scheduled Meds:  aspirin   81 mg Oral BID   docusate sodium   100 mg Oral BID   enoxaparin  (LOVENOX ) injection  40 mg Subcutaneous Q24H   famotidine   20 mg Oral Daily   metoprolol  succinate  100 mg Oral Daily   mupirocin  ointment   Nasal BID   pantoprazole   40 mg Oral Daily   polyethylene glycol  17 g Oral BID   rosuvastatin   20 mg Oral Daily   senna-docusate  1 tablet Oral BID   Continuous Infusions:     LOS: 5 days    Time spent: 40 minutes    Renato Applebaum, MD [facility]

## 2024-05-14 ENCOUNTER — Inpatient Hospital Stay (HOSPITAL_COMMUNITY)

## 2024-05-14 DIAGNOSIS — S72002D Fracture of unspecified part of neck of left femur, subsequent encounter for closed fracture with routine healing: Secondary | ICD-10-CM

## 2024-05-14 DIAGNOSIS — I2510 Atherosclerotic heart disease of native coronary artery without angina pectoris: Secondary | ICD-10-CM | POA: Diagnosis not present

## 2024-05-14 DIAGNOSIS — I700 Atherosclerosis of aorta: Secondary | ICD-10-CM | POA: Diagnosis not present

## 2024-05-14 DIAGNOSIS — R000 Tachycardia, unspecified: Secondary | ICD-10-CM | POA: Diagnosis not present

## 2024-05-14 DIAGNOSIS — J9811 Atelectasis: Secondary | ICD-10-CM | POA: Diagnosis not present

## 2024-05-14 DIAGNOSIS — S72002S Fracture of unspecified part of neck of left femur, sequela: Secondary | ICD-10-CM | POA: Diagnosis not present

## 2024-05-14 LAB — BASIC METABOLIC PANEL WITH GFR
Anion gap: 9 (ref 5–15)
BUN: 13 mg/dL (ref 6–20)
CO2: 27 mmol/L (ref 22–32)
Calcium: 9 mg/dL (ref 8.9–10.3)
Chloride: 95 mmol/L — ABNORMAL LOW (ref 98–111)
Creatinine, Ser: 0.95 mg/dL (ref 0.61–1.24)
GFR, Estimated: >60 mL/min (ref >60)
Glucose, Bld: 96 mg/dL (ref 70–99)
Potassium: 4 mmol/L (ref 3.5–5.1)
Sodium: 131 mmol/L — ABNORMAL LOW (ref 135–145)

## 2024-05-14 LAB — D-DIMER, QUANTITATIVE: D-Dimer, Quant: 5.04 ug{FEU}/mL — ABNORMAL HIGH (ref 0.00–0.50)

## 2024-05-14 MED ORDER — ASPIRIN 81 MG PO CHEW: 81.0000 mg | CHEWABLE_TABLET | Freq: Two times a day (BID) | ORAL | 0 refills | Status: AC

## 2024-05-14 MED ORDER — POLYETHYLENE GLYCOL 3350 17 G PO PACK
17.0000 g | PACK | Freq: Two times a day (BID) | ORAL | 0 refills | Status: AC
Start: 2024-05-14 — End: ?

## 2024-05-14 MED ORDER — IOHEXOL 350 MG/ML SOLN
75.0000 mL | Freq: Once | INTRAVENOUS | Status: AC | PRN
Start: 2024-05-14 — End: 2024-05-14
  Administered 2024-05-14: 75 mL via INTRAVENOUS

## 2024-05-14 MED ORDER — SENNOSIDES-DOCUSATE SODIUM 8.6-50 MG PO TABS: 1.0000 | ORAL_TABLET | Freq: Two times a day (BID) | ORAL | 0 refills | Status: AC

## 2024-05-14 MED ORDER — METOPROLOL SUCCINATE ER 100 MG PO TB24: 100.0000 mg | ORAL_TABLET | Freq: Every day | ORAL | 0 refills | Status: AC

## 2024-05-14 NOTE — [place] FL2 (Signed)
 [place]  MEDICAID FL2 LEVEL OF CARE FORM     IDENTIFICATION  Patient Name: Cody Preston Birthdate: 29-Jun-1966 Sex: male Admission Date (Current Location): 05/08/2024  [facility] and [place] Number:  Producer, television/film/video and Address:  [facility], [address], [place], [place] [address]      Provider Number:    Attending Physician Name and Address:  Raenelle Coria, MD  Relative Name and Phone Number:       Current Level of Care: Hospital Recommended Level of Care: Skilled Nursing Facility Prior Approval Number:    Date Approved/Denied:   PASRR Number:    Discharge Plan: SNF    Current Diagnoses: Patient Active Problem List   Diagnosis Date Noted   Hip fracture (HCC) 05/08/2024   Primary hypertension 09/27/2022   Hypercholesteremia 01/22/2021   CAD (coronary artery disease) 05/20/2020   Adjustment disorder with mixed disturbance of emotions and conduct 04/21/2020   Non-ST elevation (NSTEMI) myocardial infarction (HCC) 04/20/2020   NSTEMI (non-ST elevated myocardial infarction) (HCC) 04/20/2020   Abnormal stress test 12/18/2019   Post PTCA 12/18/2019   Lung nodule 02/23/2013   Coronary artery disease involving native coronary artery of native heart with angina pectoris (HCC) 01/16/2013   Abnormal cholesterol test 01/16/2013    Orientation RESPIRATION BLADDER Height & Weight     Time, Self, Situation, Place  Normal Continent Weight: 67.9 kg Height:  5' 6 (167.6 cm)  BEHAVIORAL SYMPTOMS/MOOD NEUROLOGICAL BOWEL NUTRITION STATUS      Continent Diet (REFER TO D/C SUMMARY)  AMBULATORY STATUS COMMUNICATION OF NEEDS Skin   Extensive Assist Verbally Normal (s/p INTRAMEDULLARY NAIL LEFT FEMUR, 7/16)                       Personal Care Assistance Level of Assistance  Bathing, Feeding, Dressing Bathing Assistance: Limited assistance Feeding assistance: Independent Dressing Assistance: Limited assistance     Functional Limitations Info   Sight, Hearing, Speech Sight Info: Adequate (wears glasses) Hearing Info: Adequate Speech Info: Adequate    SPECIAL CARE FACTORS FREQUENCY  PT (By licensed PT), OT (By licensed OT)     PT Frequency: 5X/week evaluate and treat OT Frequency: 5X/week evaluate and treat            Contractures Contractures Info: Not present    Additional Factors Info  Code Status, Allergies Code Status Info: Full Code Allergies Info: Bee Venom, Wasp Venom           Current Medications (05/14/2024):  This is the current hospital active medication list Current Facility-Administered Medications  Medication Dose Route Frequency Provider Last Rate Last Admin   acetaminophen  (TYLENOL ) tablet 650 mg  650 mg Oral Q6H PRN Sharl Selinda Dover, MD   650 mg at 05/11/24 0304   Or   acetaminophen  (TYLENOL ) suppository 650 mg  650 mg Rectal Q6H PRN Sharl Selinda Dover, MD       aspirin  chewable tablet 81 mg  81 mg Oral BID Ghimire, Kuber, MD   81 mg at 05/13/24 2135   docusate sodium  (COLACE) capsule 100 mg  100 mg Oral BID Sharl Selinda Dover, MD   100 mg at 05/13/24 2137   enoxaparin  (LOVENOX ) injection 40 mg  40 mg Subcutaneous Q24H Sharl Selinda Dover, MD   40 mg at 05/13/24 9076   famotidine  (PEPCID ) tablet 20 mg  20 mg Oral Daily Ghimire, Kuber, MD   20 mg at 05/13/24 9075   HYDROmorphone  (DILAUDID ) injection  0.5 mg  0.5 mg Intravenous Q3H PRN Sharl Selinda Dover, MD   0.5 mg at 05/14/24 0419   menthol -cetylpyridinium (CEPACOL) lozenge 3 mg  1 lozenge Oral PRN Sharl Selinda Dover, MD       Or   phenol (CHLORASEPTIC) mouth spray 1 spray  1 spray Mouth/Throat PRN Sharl Selinda Dover, MD       metoprolol  succinate (TOPROL -XL) 24 hr tablet 100 mg  100 mg Oral Daily Ghimire, Kuber, MD   100 mg at 05/12/24 9157   mupirocin  ointment (BACTROBAN ) 2 %   Nasal BID Raenelle Coria, MD   Given at 05/13/24 2137   ondansetron  (ZOFRAN ) tablet 4 mg  4 mg Oral Q6H PRN Sharl Selinda Dover, MD   4 mg at  05/12/24 0551   Or   ondansetron  (ZOFRAN ) injection 4 mg  4 mg Intravenous Q6H PRN Sharl Selinda Dover, MD   4 mg at 05/14/24 0526   oxyCODONE  (Oxy IR/ROXICODONE ) immediate release tablet 5 mg  5 mg Oral Q4H PRN Ghimire, Kuber, MD   5 mg at 05/13/24 2255   pantoprazole  (PROTONIX ) EC tablet 40 mg  40 mg Oral Daily Sharl Selinda Dover, MD   40 mg at 05/13/24 9075   polyethylene glycol (MIRALAX  / GLYCOLAX ) packet 17 g  17 g Oral BID Ghimire, Kuber, MD   17 g at 05/13/24 2135   rosuvastatin  (CRESTOR ) tablet 20 mg  20 mg Oral Daily Sharl Selinda Dover, MD   20 mg at 05/13/24 9075   senna-docusate (Senokot-S) tablet 1 tablet  1 tablet Oral BID Ghimire, Kuber, MD   1 tablet at 05/13/24 2136   traZODone  (DESYREL ) tablet 50 mg  50 mg Oral QHS PRN Ghimire, Kuber, MD   50 mg at 05/13/24 2256     Discharge Medications: Please see discharge summary for a list of discharge medications.  Relevant Imaging Results:  Relevant Lab Results:   Additional Information SS# [id number]  Rosalva Jon Bloch, RN

## 2024-05-14 NOTE — Progress Notes (Signed)
 Physical Therapy Treatment Patient Details Name: Cody Preston MRN: [id number] DOB: 25-Dec-1965 Today's Date: 05/14/2024   History of Present Illness Cody Preston is a 58 y.o. male who presented to ED 05/08/24 following ground level fall at a gas station. X-ray revealed a left femoral neck fracture. Pt s/p Lt intramedullary rod and nail 7/16. PMHx: NSTEMI, stent in 2021, HTN, HLD, GERD, and chronic low back pain.    PT Comments  Pt up in chair on arrival, agreeable to session with focus on stair training. Pt continues to require up to mod A to stand from chair without arm rests and max A to attempt stepping up to 4 step. Pt unable to motor plan to step R foot up to step with RW support to simulate bil rails. Pt able to march RLE in place lifting foot high enough to clear step in insolated movement, however pt with poor carryover to functional task despite max cues and max A for steadying assist. Pt continues to demonstrate unsafe techniques throughout mobility, resting forearms on RW and bending down to attempt to mobilize LLE with R hand. Max cues provided for safety throughout session. Pt sister present for session and verbalizing inability to provide needed level of hands on assist pt currently requires. Discussed with supervising PT and updated recommendation as pt will benefit from continued inpatient follow up therapy, <3 hours/day to address deficits and maximize functional independence and decrease caregiver burden.    If plan is discharge home, recommend the following: A little help with bathing/dressing/bathroom;Assistance with cooking/housework;Assist for transportation;Help with stairs or ramp for entrance;A lot of help with walking and/or transfers   Can travel by private vehicle     No  Equipment Recommendations  Rolling walker (2 wheels) (recommend stair lift or ramp for home)    Recommendations for Other Services       Precautions / Restrictions Precautions Precautions:  Fall Recall of Precautions/Restrictions: Intact Precaution/Restrictions Comments: decreased L dorsiflexion and hip flexion, recommended foot up brace to help with this but pt defers Restrictions Weight Bearing Restrictions Per Provider Order: No LLE Weight Bearing Per Provider Order: Weight bearing as tolerated     Mobility  Bed Mobility Overal bed mobility: Needs Assistance             General bed mobility comments: pt up in chair on arrival and at end of session    Transfers Overall transfer level: Needs assistance Equipment used: Rolling walker (2 wheels) Transfers: Sit to/from Stand, Bed to chair/wheelchair/BSC Sit to Stand: Min assist, Mod assist           General transfer comment: from chair without arm rests min- modA, cues for safety and proximity, pt ignoring some cues.    Ambulation/Gait Ambulation/Gait assistance: Min assist Gait Distance (Feet): 15 Feet Assistive device: Rolling walker (2 wheels) Gait Pattern/deviations: Decreased stride length, Decreased weight shift to left, Antalgic, Trunk flexed, Step-to pattern, Decreased dorsiflexion - left, Decreased stance time - left, Decreased step length - right, Knee flexed in stance - left, Knee flexed in stance - right, Narrow base of support, Shuffle Gait velocity: decreased, grossly <0.1 m/s     General Gait Details: pt with short step-to pattern with narrow BOS. cues for upright posture and to keep hands on RW and to activate triceps/elbows more to offload L hip/femur as pt resting R forearm on RW with fatigue. no overt LOB but unsafe technique. MinA for RW management at times and for LLE improved step length.  Stairs   Stairs assistance: Max assist, +2 safety/equipment Stair Management: Two rails, Forwards, Step to pattern Number of Stairs: 0 General stair comments: Single 4 step in room, pt unable to step foot up to step, max cues for upright posture and UE support on RW to simulate bil  rails   Wheelchair Mobility     Tilt Bed    Modified Rankin (Stroke Patients Only)       Balance Overall balance assessment: Needs assistance, History of Falls Sitting-balance support: Bilateral upper extremity supported, Feet supported Sitting balance-Leahy Scale: Fair Sitting balance - Comments: Pt sat EOB with supervision.   Standing balance support: Bilateral upper extremity supported, During functional activity, Reliant on assistive device for balance Standing balance-Leahy Scale: Poor Standing balance comment: Pt dependent on RW                            Communication Communication Communication: No apparent difficulties  Cognition Arousal: Alert Behavior During Therapy: Anxious, Flat affect   PT - Cognitive impairments: Problem solving, Safety/Judgement, Sequencing                       PT - Cognition Comments: pt with very poor insight into deficits and not receptive to PTA instruction on safer techniques to allow him to succeed with mobility goals. Following commands: Intact      Cueing Cueing Techniques: Verbal cues, Visual cues, Tactile cues  Exercises      General Comments        Pertinent Vitals/Pain Pain Assessment Pain Assessment: Faces Faces Pain Scale: Hurts even more Pain Location: L hip/thigh/knee Pain Descriptors / Indicators: Guarding, Grimacing, Discomfort, Sharp, Moaning Pain Intervention(s): Monitored during session, Limited activity within patient's tolerance    Home Living                          Prior Function            PT Goals (current goals can now be found in the care plan section) Acute Rehab PT Goals Patient Stated Goal: Return Home as soon as possible PT Goal Formulation: With patient Time For Goal Achievement: 05/24/24 Progress towards PT goals: Not progressing toward goals - comment    Frequency    Min 2X/week      PT Plan      Co-evaluation              AM-PAC PT  6 Clicks Mobility   Outcome Measure  Help needed turning from your back to your side while in a flat bed without using bedrails?: A Little Help needed moving from lying on your back to sitting on the side of a flat bed without using bedrails?: A Little Help needed moving to and from a bed to a chair (including a wheelchair)?: A Little Help needed standing up from a chair using your arms (e.g., wheelchair or bedside chair)?: A Lot Help needed to walk in hospital room?: A Little Help needed climbing 3-5 steps with a railing? : Total 6 Click Score: 15    End of Session Equipment Utilized During Treatment: Gait belt Activity Tolerance: Patient tolerated treatment well;Patient limited by pain Patient left: with call bell/phone within reach;in chair;with family/visitor present Nurse Communication: Mobility status;Patient requests pain meds PT Visit Diagnosis: Difficulty in walking, not elsewhere classified (R26.2);Unsteadiness on feet (R26.81);History of falling (Z91.81)     Time: 8970-8896 PT  Time Calculation (min) (ACUTE ONLY): 34 min  Charges:    $Gait Training: 23-37 mins PT General Charges $$ ACUTE PT VISIT: 1 Visit                     Ayauna Mcnay R. PTA Acute Rehabilitation Services Office: [phone]   Therisa CHRISTELLA Boor 05/14/2024, 11:08 AM

## 2024-05-14 NOTE — Telephone Encounter (Signed)
 Will update the preop APP pt is currently admitted.

## 2024-05-14 NOTE — TOC Transition Note (Addendum)
 Transition of Care [facility]) - Discharge Note   Patient Details  Name: Cody Preston MRN: [id number] Date of Birth: 21-Apr-1966  [facility] CM/SW Contact:  Rosalva Jon Bloch, RN Phone Number: 05/14/2024, 9:51 AM   Clinical Narrative:    Patient will DC to: home Anticipated DC date: 05/14/2024 Family notified:  yes Transport by: yes       - left hip  fracture       - s/p INTRAMEDULLARY NAIL LEFT FEMUR , 7/16 Per MD patient ready for DC today. RN, patient, patient's sister aware of DC plan.  Pt states family will assist with care if needed once home. Pt states has RW @ home. Agreeable to outpatient therapy. Referral made with [facility] and noted on AVS. Pt without RX med concerns . Pt to pick up meds from local pharmacy.. Sister to provide transportation to home.  05/14/2024 10:42 am Pt now will transition to sister's home ([address] [place], [address]) and would like home health PT services, provider made aware.  05/14/2024 11:25 am  After am PT session, PT is recommending SNF placement, MD made aware and pt is agreeable to SNF placment.   RNCM will sign off for now as intervention is no longer needed. Please consult us  again if new needs arise.    Final next level of care: Home/Self Care Barriers to Discharge: No Barriers Identified   Patient Goals and CMS Choice     Choice offered to / list presented to : Patient      Discharge Placement                       Discharge Plan and Services Additional resources added to the After Visit Summary for     Discharge Planning Services: CM Consult            DME Arranged: Vannie rolling DME Agency: [employer] Date DME Agency Contacted: 05/11/24 Time DME Agency Contacted: [id number] Representative spoke with at DME Agency: [facility] Arranged: PT          Social Drivers of Health (SDOH) Interventions SDOH Screenings   Food Insecurity: No Food Insecurity (05/09/2024)  Housing: Low  Risk  (05/09/2024)  Transportation Needs: No Transportation Needs (05/09/2024)  Utilities: Not At Risk (05/09/2024)  Tobacco Use: Low Risk  (05/09/2024)     Readmission Risk Interventions     No data to display

## 2024-05-14 NOTE — Discharge Summary (Signed)
 Physician Discharge Summary  Cody Preston FMW:[id number] DOB: 1966-07-30 DOA: 05/08/2024  PCP: Leigh Lung, MD  Admit date: 05/08/2024 Discharge date: 05/14/2024  Admitted From: Home Disposition: Home with home health  Recommendations for Outpatient Follow-up:  Follow up with PCP in 1-2 weeks Please obtain BMP/CBC in one week Orthopedics to schedule follow-up  Home Health: PT Equipment/Devices: Rolling walker  Discharge Condition: Stable CODE STATUS: Full code Diet recommendation: Regular diet  Discharge summary: 58 year old with history of non-STEMI, coronary stent in 2021, hypertension, hyperlipidemia, GERD, chronic low back pain presented with mechanical fall and left hip pain.  He was found to have left femoral neck fracture.     Assessment & Plan:   Posttraumatic left hip fracture: Status post left IM nail, Dr. Sharl 7/16.   Surgically stable as per surgery.  Weightbearing as tolerated.  DVT prophylaxis aspirin  81 mg twice daily for 6 weeks then continue his usual dose of aspirin .  Use a stool softener and laxatives to avoid constipation.  He has chronic constipation and perianal nerve damage. Stayed in the hospital to work with PT OT.  Currently decided to go home with family member support and doing home health PT. Orthopedics to schedule follow-up.   Coronary artery disease: Currently no evidence of recurrence.  Continued on aspirin , beta-blockers and statins.  He was persistently tachycardic despite controlling his surgical pain.   Toprol  XL dose increased to 100 mg daily.  Intermittently tachycardic but asymptomatic.  Will keep on current regimen.  Patient was on losartan .  Blood pressures are low normal.  In order to make room for Toprol  XL, discontinuing losartan .   Hypertension: Stable on beta-blockers.  Discontinuing losartan  to make room for beta-blockers.   Hyperlipidemia: On statin.   Hyponatremia: Chronic and stable.  He stable to discharge  home.   Discharge Diagnoses:  Principal Problem:   Hip fracture (HCC) Active Problems:   Lung nodule   CAD (coronary artery disease)   Hypercholesteremia   Primary hypertension    Discharge Instructions  Discharge Instructions     Ambulatory referral to Occupational Therapy   Complete by: As directed    Ambulatory referral to Physical Therapy   Complete by: As directed    Diet - low sodium heart healthy   Complete by: As directed    Increase activity slowly   Complete by: As directed    Leave dressing on - Keep it clean, dry, and intact until clinic visit   Complete by: As directed       Allergies as of 05/14/2024       Reactions   Bee Venom Swelling, Other (See Comments)   Finger became blue and he had to be rushed to a hospital   Wasp Venom Swelling, Other (See Comments)   Skin turns black and blue at site stung, near syncope        Medication List     PAUSE taking these medications    aspirin  81 MG chewable tablet Wait to take this until: June 19, 2024 Chew 1 tablet (81 mg total) by mouth daily. You also have another medication with the same name that you may need to continue taking.       STOP taking these medications    atorvastatin 20 MG tablet Commonly known as: LIPITOR   losartan  25 MG tablet Commonly known as: COZAAR    traMADol  50 MG tablet Commonly known as: ULTRAM        TAKE these medications    acetaminophen  325  MG tablet Commonly known as: TYLENOL  Take 650 mg by mouth every 6 (six) hours as needed.   aspirin  81 MG chewable tablet Chew 1 tablet (81 mg total) by mouth 2 (two) times daily. What changed: Another medication with the same name was paused. Ask your nurse or doctor if you should take this medication.   HYDROcodone -acetaminophen  7.5-325 MG tablet Commonly known as: NORCO Take 1-2 tablets by mouth every 6 (six) hours as needed for moderate pain (pain score 4-6).   metoprolol  succinate 100 MG 24 hr tablet Commonly  known as: TOPROL -XL Take 1 tablet (100 mg total) by mouth daily. Take with or immediately following a meal. What changed:  medication strength how much to take   nitroGLYCERIN  0.4 MG SL tablet Commonly known as: NITROSTAT  Place 1 tablet (0.4 mg total) under the tongue every 5 (five) minutes as needed for chest pain.   pantoprazole  40 MG tablet Commonly known as: PROTONIX  Take 40 mg by mouth daily.   polyethylene glycol 17 g packet Commonly known as: MIRALAX  / GLYCOLAX  Take 17 g by mouth 2 (two) times daily.   rosuvastatin  20 MG tablet Commonly known as: CRESTOR  Take 1 tablet (20 mg total) by mouth daily.   senna-docusate 8.6-50 MG tablet Commonly known as: Senokot-S Take 1 tablet by mouth 2 (two) times daily.               Durable Medical Equipment  (From admission, onward)           Start     Ordered   05/10/24 1821  For home use only DME Walker rolling  Once       Question Answer Comment  Walker: With 5 Inch Wheels   Patient needs a walker to treat with the following condition Fx      05/10/24 1821              Discharge Care Instructions  (From admission, onward)           Start     Ordered   05/14/24 0000  Leave dressing on - Keep it clean, dry, and intact until clinic visit        05/14/24 0850            Follow-up Information     Sharl Selinda Dover, MD Follow up in 2 week(s).   Specialty: Orthopedic Surgery Why: For suture removal, For wound re-check Contact information: [address] [place] [address] [phone]         Leigh Lung, MD Follow up.   Specialty: Family Medicine Contact information: [address] [place] [address] [phone]         [facility] Follow up.   Specialty: Rehabilitation Why: referral made for outpatient PT and OT services, please call and arrange appointment time Contact information: [address] [place]  [place]  [address] [phone]        [facility] Follow up.   Specialty: Home Health Services Why: home health services will be provided by [facility], start of care within 48 hours post discharge Contact information: [address] [place] [address] [phone]                Allergies  Allergen Reactions   Bee Venom Swelling and Other (See Comments)    Finger became blue and he had to be rushed to a hospital   Wasp Venom Swelling and  Other (See Comments)    Skin turns black and blue at site stung, near syncope    Consultations: Orthopedics Cardiology   Procedures/Studies: DG HIP UNILAT WITH PELVIS 2-3 VIEWS LEFT Result Date: 05/09/2024 CLINICAL DATA:  Known left femoral fracture EXAM: DG HIP (WITH OR WITHOUT PELVIS) 2-3V LEFT COMPARISON:  05/08/2024 FLUOROSCOPY TIME:  Radiation Exposure Index (as provided by the fluoroscopic device): 5.97 mGy If the device does not provide the exposure index: Fluoroscopy Time:  59 seconds Number of Acquired Images:  5 FINDINGS: Initial images demonstrate medullary rod within the proximal left femur. Fixation screw was then placed traversing the femoral neck. Distal fixation screw in the proximal femoral shaft is noted as well. Fracture fragments are in near anatomic alignment. IMPRESSION: ORIF of proximal left femoral fracture. Electronically Signed   By: Oneil Devonshire M.D.   On: 05/09/2024 21:40   DG C-Arm 1-60 Min-No Report Result Date: 05/09/2024 Fluoroscopy was utilized by the requesting physician.  No radiographic interpretation.   ECHOCARDIOGRAM COMPLETE Result Date: 05/09/2024    ECHOCARDIOGRAM REPORT   Patient Name:   ROONEY SWAILS Date of Exam: 05/09/2024 Medical Rec #:  [id number]   Height:       66.0 in Accession #:    [id number]  Weight:       152.0 lb Date of Birth:  Jun 12, 1966   BSA:          1.780 m Patient Age:    57 years    BP:           119/92 mmHg Patient Gender: M           HR:            109 bpm. Exam Location:  Inpatient Procedure: 2D Echo, Color Doppler and Cardiac Doppler (Both Spectral and Color            Flow Doppler were utilized during procedure). Indications:    I25.5 Ischemic cardiomyopathy, Pre-op  History:        Patient has prior history of Echocardiogram examinations, most                 recent 04/20/2020. CAD; Risk Factors:Hypertension and                 Dyslipidemia.  Sonographer:    Damien Senior RDCS Referring Phys: [id number] ZANE ADAMS  Sonographer Comments: Patient laying towards right side, unable to reposition due to hip fracture IMPRESSIONS  1. Left ventricular ejection fraction, by estimation, is 50 to 55%. The left ventricle has low normal function. The left ventricle demonstrates global hypokinesis. Left ventricular diastolic parameters are indeterminate.  2. Right ventricular systolic function is normal. The right ventricular size is normal.  3. The mitral valve is normal in structure. No evidence of mitral valve regurgitation. No evidence of mitral stenosis.  4. The aortic valve is normal in structure. Aortic valve regurgitation is not visualized. No aortic stenosis is present.  5. The inferior vena cava is normal in size with greater than 50% respiratory variability, suggesting right atrial pressure of 3 mmHg. FINDINGS  Left Ventricle: Left ventricular ejection fraction, by estimation, is 50 to 55%. The left ventricle has low normal function. The left ventricle demonstrates global hypokinesis. The left ventricular internal cavity size was normal in size. There is no left ventricular hypertrophy. Left ventricular diastolic parameters are indeterminate. Right Ventricle: The right ventricular size is normal. No increase in right ventricular wall thickness. Right ventricular systolic function is normal.  Left Atrium: Left atrial size was normal in size. Right Atrium: Right atrial size was normal in size. Pericardium: There is no evidence of pericardial effusion. Mitral  Valve: The mitral valve is normal in structure. No evidence of mitral valve regurgitation. No evidence of mitral valve stenosis. Tricuspid Valve: The tricuspid valve is normal in structure. Tricuspid valve regurgitation is not demonstrated. No evidence of tricuspid stenosis. Aortic Valve: The aortic valve is normal in structure. Aortic valve regurgitation is not visualized. No aortic stenosis is present. Pulmonic Valve: The pulmonic valve was normal in structure. Pulmonic valve regurgitation is not visualized. No evidence of pulmonic stenosis. Aorta: The aortic root is normal in size and structure. Venous: The inferior vena cava is normal in size with greater than 50% respiratory variability, suggesting right atrial pressure of 3 mmHg. IAS/Shunts: No atrial level shunt detected by color flow Doppler.  LEFT VENTRICLE PLAX 2D LVIDd:         3.90 cm LVIDs:         2.50 cm LV PW:         0.80 cm LV IVS:        0.80 cm LVOT diam:     2.20 cm LV SV:         57 LV SV Index:   32 LVOT Area:     3.80 cm  LV Volumes (MOD) LV vol d, MOD A2C: 63.6 ml LV vol s, MOD A2C: 34.5 ml LV SV MOD A2C:     29.1 ml RIGHT VENTRICLE RV S prime:     11.60 cm/s TAPSE (M-mode): 1.6 cm LEFT ATRIUM             Index        RIGHT ATRIUM          Index LA diam:        3.30 cm 1.85 cm/m   RA Area:     8.59 cm LA Vol (A2C):   37.1 ml 20.85 ml/m  RA Volume:   12.90 ml 7.25 ml/m LA Vol (A4C):   38.1 ml 21.41 ml/m LA Biplane Vol: 38.7 ml 21.75 ml/m  AORTIC VALVE LVOT Vmax:   84.60 cm/s LVOT Vmean:  63.300 cm/s LVOT VTI:    0.149 m  AORTA Ao Root diam: 3.30 cm Ao Asc diam:  3.10 cm  SHUNTS Systemic VTI:  0.15 m Systemic Diam: 2.20 cm Kardie Tobb DO Electronically signed by Dub Huntsman DO Signature Date/Time: 05/09/2024/3:06:04 PM    Final    CT PELVIS WO CONTRAST Result Date: 05/08/2024 CLINICAL DATA:  Clemens.  Left femoral neck fracture. EXAM: CT PELVIS WITHOUT CONTRAST TECHNIQUE: Multidetector CT imaging of the pelvis was performed following the  standard protocol without intravenous contrast. RADIATION DOSE REDUCTION: This exam was performed according to the departmental dose-optimization program which includes automated exposure control, adjustment of the mA and/or kV according to patient size and/or use of iterative reconstruction technique. COMPARISON:  Radiographs 05/08/2024 FINDINGS: There is a complex comminuted intertrochanteric fracture of the left hip with significant varus deformity. This fracture also involves the base of the cervical neck but the cervical neck is otherwise intact. The femoral head is normally located. No acetabular fracture. No right hip fracture. The pubic symphysis and SI joints are intact. No acute pelvic fractures. Remote healed right-sided pubic rami fractures. No significant intrapelvic abnormalities are identified. Age advanced vascular disease. Stable large complex right scrotal lesion possible complex hydrocele or hematoma. This was also seen on the prior  ultrasound examination January. IMPRESSION: 1. Complex comminuted intertrochanteric fracture of the left hip with significant varus deformity. 2. No acetabular fracture. 3. No right hip fracture. 4. Remote healed right-sided pubic rami fractures. 5. Stable large complex right scrotal lesion possible complex hydrocele or hematoma. This was also seen on the prior ultrasound examination January. Electronically Signed   By: MYRTIS Stammer M.D.   On: 05/08/2024 22:51   DG Femur Min 2 Views Left Result Date: 05/08/2024 CLINICAL DATA:  Fall EXAM: LEFT FEMUR 2 VIEWS COMPARISON:  None Available. FINDINGS: There is an acute left femoral neck fracture with superolateral displacement of the distal fracture fragment. There is no dislocation. Peripheral vascular calcifications are present. Soft tissues are otherwise within normal limits. IMPRESSION: Acute left femoral neck fracture. Electronically Signed   By: Greig Pique M.D.   On: 05/08/2024 17:21   (Echo, Carotid, EGD,  Colonoscopy, ERCP)    Subjective: Patient seen and examined.  Episodic pain present.  Eager to go home.  Sister is coming to pick him up.  He thinks he can manage at home and also his sister is going to help him.   Discharge Exam: Vitals:   05/14/24 0600 05/14/24 0802  BP:  101/75  Pulse: (!) 121 (!) 125  Resp:  18  Temp:  97.8 F (36.6 C)  SpO2:  100%   Vitals:   05/13/24 1935 05/14/24 0404 05/14/24 0600 05/14/24 0802  BP: 118/74 135/88  101/75  Pulse:  (!) 140 (!) 121 (!) 125  Resp: 17 17  18   Temp: 99.2 F (37.3 C) 98.4 F (36.9 C)  97.8 F (36.6 C)  TempSrc:  Oral    SpO2: 100% 99%  100%  Weight:      Height:        General: Pt is alert, awake, not in acute distress Cardiovascular: RRR, S1/S2 +, no rubs, no gallops Respiratory: CTA bilaterally, no wheezing, no rhonchi Abdominal: Soft, NT, ND, bowel sounds + Extremities: no edema, no cyanosis Left lateral thigh incision clean and dry.    The results of significant diagnostics from this hospitalization (including imaging, microbiology, ancillary and laboratory) are listed below for reference.     Microbiology: Recent Results (from the past 240 hours)  MRSA Next Gen by PCR, Nasal     Status: Abnormal   Collection Time: 05/09/24  5:13 PM   Specimen: Nasal Mucosa; Nasal Swab  Result Value Ref Range Status   MRSA by PCR Next Gen DETECTED (A) NOT DETECTED Final    Comment: RESULT CALLED TO, READ BACK BY AND VERIFIED WITH: RN DRISS ZINE [id number] FCP (NOTE) The GeneXpert MRSA Assay (FDA approved for NASAL specimens only), is one component of a comprehensive MRSA colonization surveillance program. It is not intended to diagnose MRSA infection nor to guide or monitor treatment for MRSA infections. Test performance is not FDA approved in patients less than 58 years old. Performed at [facility], [address] [place], [place] [address]      Labs: BNP (last 3 results) No results for input(s): BNP  in the last 8760 hours. Basic Metabolic Panel: Recent Labs  Lab 05/08/24 1609 05/09/24 0832 05/10/24 0800 05/11/24 0858  NA 131* 128* 127* 129*  K 4.7 4.0 4.0 3.8  CL 99 95* 94* 94*  CO2 25 24 23 24   GLUCOSE 109* 121* 156* 147*  BUN 11 11 11 14   CREATININE 0.94 0.80 0.82 0.96  CALCIUM  8.5* 8.7* 8.8* 9.1  MG  --   --  1.8  --   PHOS  --   --  2.7  --    Liver Function Tests: No results for input(s): AST, ALT, ALKPHOS, BILITOT, PROT, ALBUMIN in the last 168 hours. No results for input(s): LIPASE, AMYLASE in the last 168 hours. No results for input(s): AMMONIA in the last 168 hours. CBC: Recent Labs  Lab 05/08/24 1609 05/09/24 0832 05/10/24 0545 05/11/24 0858  WBC 8.2 12.1* 12.0* 10.2  NEUTROABS 6.5  --   --  6.9  HGB 13.2 12.7* 11.3* 11.2*  HCT 40.5 38.9* 33.7* 33.8*  MCV 83.5 82.2 81.8 81.6  PLT 356 346 312 316   Cardiac Enzymes: No results for input(s): CKTOTAL, CKMB, CKMBINDEX, TROPONINI in the last 168 hours. BNP: Invalid input(s): POCBNP CBG: No results for input(s): GLUCAP in the last 168 hours. D-Dimer No results for input(s): DDIMER in the last 72 hours. Hgb A1c No results for input(s): HGBA1C in the last 72 hours. Lipid Profile No results for input(s): CHOL, HDL, LDLCALC, TRIG, CHOLHDL, LDLDIRECT in the last 72 hours. Thyroid  function studies Recent Labs    05/12/24 1043  TSH 0.534   Anemia work up No results for input(s): VITAMINB12, FOLATE, FERRITIN, TIBC, IRON, RETICCTPCT in the last 72 hours. Urinalysis No results found for: COLORURINE, APPEARANCEUR, LABSPEC, PHURINE, GLUCOSEU, HGBUR, BILIRUBINUR, KETONESUR, PROTEINUR, UROBILINOGEN, NITRITE, LEUKOCYTESUR Sepsis Labs Recent Labs  Lab 05/08/24 1609 05/09/24 0832 05/10/24 0545 05/11/24 0858  WBC 8.2 12.1* 12.0* 10.2   Microbiology Recent Results (from the past 240 hours)  MRSA Next Gen by PCR, Nasal      Status: Abnormal   Collection Time: 05/09/24  5:13 PM   Specimen: Nasal Mucosa; Nasal Swab  Result Value Ref Range Status   MRSA by PCR Next Gen DETECTED (A) NOT DETECTED Final    Comment: RESULT CALLED TO, READ BACK BY AND VERIFIED WITH: RN DRISS ZINE [id number] FCP (NOTE) The GeneXpert MRSA Assay (FDA approved for NASAL specimens only), is one component of a comprehensive MRSA colonization surveillance program. It is not intended to diagnose MRSA infection nor to guide or monitor treatment for MRSA infections. Test performance is not FDA approved in patients less than 38 years old. Performed at [facility], [address] [place], [place] [address]      Time coordinating discharge: 40 minutes  SIGNED:   Renato Applebaum, MD  [facility] 05/14/2024, 11:05 AM

## 2024-05-14 NOTE — TOC Progression Note (Signed)
 Transition of Care [facility]) - Progression Note    Patient Details  Name: Cody Preston MRN: [id number] Date of Birth: 1966-03-25  [facility] CM/SW Contact  Rosalva Jon Bloch, RN Phone Number: 05/14/2024, 11:30 AM  Clinical Narrative:    RNCM received consult for possible SNF placement at time of discharge. RNCM spoke with patient regarding PT recommendation of SNF placement at time of discharge. Patient reported that he is currently unable to care for self  at home independently given patient's current physical needs and fall risk. Patient expressed understanding of PT recommendation and is agreeable to SNF placement at time of discharge. Patient reports preference for [facility]  . RNCM discussed insurance authorization process and provided Medicare SNF ratings list. Patient expressed being hopeful for rehab and to feel better soon. No further questions reported at this time. RNCM to continue to follow and assist with discharge planning needs.    Expected Discharge Plan: Skilled Nursing Facility Barriers to Discharge: Other (must enter comment) (awaitng SNF bed offer)  Expected Discharge Plan and Services   Discharge Planning Services: CM Consult     Expected Discharge Date: 05/14/24               DME Arranged: Vannie rolling DME Agency: [employer] Date DME Agency Contacted: 05/11/24 Time DME Agency Contacted: 1309 Representative spoke with at DME Agency: [facility] Arranged: PT           Social Determinants of Health (SDOH) Interventions SDOH Screenings   Food Insecurity: No Food Insecurity (05/09/2024)  Housing: Low Risk  (05/09/2024)  Transportation Needs: No Transportation Needs (05/09/2024)  Utilities: Not At Risk (05/09/2024)  Tobacco Use: Low Risk  (05/09/2024)    Readmission Risk Interventions     No data to display

## 2024-05-14 NOTE — Progress Notes (Signed)
 After patient insisted on going home and preparing discharge, he worked with physical therapy with family members at the bedside.  Patient had profound difficulty with mobility and now agreed to go to skilled nursing facility. Case management alerted. Current management remains the same.  Sinus tachycardia: Patient remains persistently send tachycardic despite increasing dose of metoprolol .  Heart rate 154 on mobility. Will check D-dimer, however this is not going to be reliable with recent hip fracture.  Will check CT angiogram of the chest as well as duplexes of lower extremities.  Discontinue discharge orders.

## 2024-05-15 ENCOUNTER — Inpatient Hospital Stay (HOSPITAL_COMMUNITY)

## 2024-05-15 DIAGNOSIS — M79606 Pain in leg, unspecified: Secondary | ICD-10-CM | POA: Diagnosis not present

## 2024-05-15 DIAGNOSIS — I2510 Atherosclerotic heart disease of native coronary artery without angina pectoris: Secondary | ICD-10-CM | POA: Diagnosis not present

## 2024-05-15 MED ORDER — HYDROCODONE-ACETAMINOPHEN 7.5-325 MG PO TABS: 1.0000 | ORAL_TABLET | ORAL | 0 refills | Status: AC | PRN

## 2024-05-15 NOTE — Progress Notes (Signed)
 BLE venous duplex has been completed.   Results can be found under chart review under CV PROC. 05/15/2024 10:18 AM Margaretann Abate RVT, RDMS

## 2024-05-15 NOTE — Progress Notes (Signed)
 PROGRESS NOTE    Cody Preston  FMW:[id number] DOB: 1966/08/25 DOA: 05/08/2024 PCP: Cody Lung, MD    Brief Narrative:  58 year old with history of non-STEMI, coronary stent in 2021, hypertension, hyperlipidemia, GERD, chronic low back pain presented with mechanical fall and left hip pain.  He was found to have left femoral neck fracture.  Admitted with orthopedic consultation.  Hemodynamically stable in the ER.  Also seen by cardiology.  Subjective:  Patient seen and examined.  No overnight events.  Agreed to go to a skilled nursing facility. D-dimer 5, CT angiogram negative.  Duplex is pending.  Assessment & Plan:   Posttraumatic left hip fracture: Status post left IM nail, Dr. Sharl 7/16.   Manage with oral pain medications in order to transition to a SNF. Weightbearing as tolerated. Aspirin  81 mg twice daily for 6 weeks, patient is already on 81 mg daily. Mobilize with PT OT.   Waiting for SNF bed availability.  Coronary artery disease: Currently no evidence of recurrence.  Continued on aspirin , beta-blockers and statins.   Persistent tachycardia and elevated D-dimer: D-dimer 5.  CT angiogram negative.  Duplex is pending.  Already improving today. Toprol  XL dose increased to 100 mg daily.  Will keep on current regimen.   Hypertension: Stable on beta-blockers.  Hyperlipidemia: On statin.  Hyponatremia: Chronic and stable.   DVT prophylaxis: enoxaparin  (LOVENOX ) injection 40 mg Start: 05/10/24 0800 SCDs Start: 05/09/24 2040SCDs   Code Status: Full code Family Communication: None at bedside Disposition Plan: Status is: Inpatient Remains inpatient appropriate because: Poor mobility.  Medically stable to transition to a SNF when bed available.   Consultants:  Orthopedics Cardiology  Procedures:  ORIF with IM nail, left hip.  Antimicrobials:  None     Objective: Vitals:   05/14/24 1559 05/14/24 2004 05/15/24 0451 05/15/24 0727  BP: 104/72 114/80 124/75  106/82  Pulse: (!) 108 (!) 108 (!) 107 96  Resp: 16 18 18 14   Temp: 97.8 F (36.6 C) 98.6 F (37 C) 98.3 F (36.8 C) 98.5 F (36.9 C)  TempSrc: Oral Oral Oral (P) Oral  SpO2: 97% 100% 98% 100%  Weight:      Height:        Intake/Output Summary (Last 24 hours) at 05/15/2024 1122 Last data filed at 05/15/2024 0000 Gross per 24 hour  Intake --  Output 550 ml  Net -550 ml   Filed Weights   05/08/24 1511 05/09/24 1530  Weight: 68.9 kg 67.9 kg    Examination:  General exam: Mild distress due to pain on left hip.  Looks otherwise comfortable. Respiratory system: Clear to auscultation. Respiratory effort normal. Cardiovascular system: S1 & S2 heard, RRR. No pedal edema.  Tachycardic. Gastrointestinal system: Abdomen is nondistended, soft and nontender. No organomegaly or masses felt. Normal bowel sounds heard. Central nervous system: Alert and oriented. No focal neurological deficits. Extremities: Symmetric 5 x 5 power. Left hip lateral thigh incision clean and dry.  Distal neurovascular status intact.    Data Reviewed: I have personally reviewed following labs and imaging studies  CBC: Recent Labs  Lab 05/08/24 1609 05/09/24 0832 05/10/24 0545 05/11/24 0858  WBC 8.2 12.1* 12.0* 10.2  NEUTROABS 6.5  --   --  6.9  HGB 13.2 12.7* 11.3* 11.2*  HCT 40.5 38.9* 33.7* 33.8*  MCV 83.5 82.2 81.8 81.6  PLT 356 346 312 316   Basic Metabolic Panel: Recent Labs  Lab 05/08/24 1609 05/09/24 0832 05/10/24 0800 05/11/24 0858 05/14/24 1813  NA 131* 128* 127* 129* 131*  K 4.7 4.0 4.0 3.8 4.0  CL 99 95* 94* 94* 95*  CO2 25 24 23 24 27   GLUCOSE 109* 121* 156* 147* 96  BUN 11 11 11 14 13   CREATININE 0.94 0.80 0.82 0.96 0.95  CALCIUM  8.5* 8.7* 8.8* 9.1 9.0  MG  --   --  1.8  --   --   PHOS  --   --  2.7  --   --    GFR: Estimated Creatinine Clearance: 78.4 mL/min (by C-G formula based on SCr of 0.95 mg/dL). Liver Function Tests: No results for input(s): AST, ALT,  ALKPHOS, BILITOT, PROT, ALBUMIN in the last 168 hours. No results for input(s): LIPASE, AMYLASE in the last 168 hours. No results for input(s): AMMONIA in the last 168 hours. Coagulation Profile: No results for input(s): INR, PROTIME in the last 168 hours. Cardiac Enzymes: No results for input(s): CKTOTAL, CKMB, CKMBINDEX, TROPONINI in the last 168 hours. BNP (last 3 results) No results for input(s): PROBNP in the last 8760 hours. HbA1C: No results for input(s): HGBA1C in the last 72 hours. CBG: No results for input(s): GLUCAP in the last 168 hours. Lipid Profile: No results for input(s): CHOL, HDL, LDLCALC, TRIG, CHOLHDL, LDLDIRECT in the last 72 hours. Thyroid  Function Tests: No results for input(s): TSH, T4TOTAL, FREET4, T3FREE, THYROIDAB in the last 72 hours.  Anemia Panel: No results for input(s): VITAMINB12, FOLATE, FERRITIN, TIBC, IRON, RETICCTPCT in the last 72 hours. Sepsis Labs: No results for input(s): PROCALCITON, LATICACIDVEN in the last 168 hours.  Recent Results (from the past 240 hours)  MRSA Next Gen by PCR, Nasal     Status: Abnormal   Collection Time: 05/09/24  5:13 PM   Specimen: Nasal Mucosa; Nasal Swab  Result Value Ref Range Status   MRSA by PCR Next Gen DETECTED (A) NOT DETECTED Final    Comment: RESULT CALLED TO, READ BACK BY AND VERIFIED WITH: RN Cody Preston [phone] FCP (NOTE) The GeneXpert MRSA Assay (FDA approved for NASAL specimens only), is one component of a comprehensive MRSA colonization surveillance program. It is not intended to diagnose MRSA infection nor to guide or monitor treatment for MRSA infections. Test performance is not FDA approved in patients less than 29 years old. Performed at [facility], [address] [place], [place] [address]          Radiology Studies: VAS US  LOWER EXTREMITY VENOUS (DVT) Result Date: 05/15/2024  Lower Venous DVT Study  Patient Name:  Cody Preston  Date of Exam:   05/15/2024 Medical Rec #: [id number]    Accession #:    [id number] Date of Birth: 1966/08/18    Patient Gender: M Patient Age:   58 years Exam Location:  [facility] Procedure:      VAS US  LOWER EXTREMITY VENOUS (DVT) Referring Phys: Cody Preston --------------------------------------------------------------------------------  Indications: Pain.  Risk Factors: Trauma Left hip fracture, s/p surgical repair (05/09/2024) Per patient - distant history of DVT post surgical procedure. Limitations: Poor ultrasound/tissue interface and patient immobility. Comparison Study: No previous exams on file Performing Technologist: Jody Hill RVT, RDMS  Examination Guidelines: A complete evaluation includes B-mode imaging, spectral Doppler, color Doppler, and power Doppler as needed of all accessible portions of each vessel. Bilateral testing is considered an integral part of a complete examination. Limited examinations for reoccurring indications may be performed as noted. The reflux portion of the exam is performed with the patient  in reverse Trendelenburg.  +---------+---------------+---------+-----------+----------+-------------------+ RIGHT    CompressibilityPhasicitySpontaneityPropertiesThrombus Aging      +---------+---------------+---------+-----------+----------+-------------------+ CFV      Full           Yes      Yes                                      +---------+---------------+---------+-----------+----------+-------------------+ SFJ      Full                                                             +---------+---------------+---------+-----------+----------+-------------------+ FV Prox  Full           Yes      Yes                                      +---------+---------------+---------+-----------+----------+-------------------+ FV Mid   Full           Yes      Yes                                       +---------+---------------+---------+-----------+----------+-------------------+ FV DistalFull           Yes      Yes                                      +---------+---------------+---------+-----------+----------+-------------------+ PFV      Full                                                             +---------+---------------+---------+-----------+----------+-------------------+ POP      Full           Yes      Yes                                      +---------+---------------+---------+-----------+----------+-------------------+ PTV      Full                                                             +---------+---------------+---------+-----------+----------+-------------------+ PERO     Full                                         Not well visualized +---------+---------------+---------+-----------+----------+-------------------+   +---------+---------------+---------+-----------+----------+--------------+ LEFT     CompressibilityPhasicitySpontaneityPropertiesThrombus Aging +---------+---------------+---------+-----------+----------+--------------+ CFV      Full  Yes      Yes                                 +---------+---------------+---------+-----------+----------+--------------+ SFJ      Full                                                        +---------+---------------+---------+-----------+----------+--------------+ FV Prox  Full           Yes      Yes                                 +---------+---------------+---------+-----------+----------+--------------+ FV Mid   Full           Yes      Yes                                 +---------+---------------+---------+-----------+----------+--------------+ FV DistalFull           Yes      Yes                                 +---------+---------------+---------+-----------+----------+--------------+ PFV      Full                                                         +---------+---------------+---------+-----------+----------+--------------+ POP      Full           Yes      Yes                                 +---------+---------------+---------+-----------+----------+--------------+ PTV      Full                                                        +---------+---------------+---------+-----------+----------+--------------+ PERO     Full                                                        +---------+---------------+---------+-----------+----------+--------------+     *See table(s) above for measurements and observations.    Preliminary    CT Angio Chest Pulmonary Embolism (PE) W or WO Contrast Result Date: 05/14/2024 EXAM: CTA of the Chest with contrast for PE 05/14/2024 09:55:45 PM TECHNIQUE: CTA of the chest was performed after the administration of intravenous contrast. Multiplanar reformatted images are provided for review. MIP images are provided for review. Automated exposure control, iterative reconstruction, and/or weight based adjustment of the mA/kV was utilized to reduce the radiation dose to as  low as reasonably achievable. COMPARISON: CT chest dated 08/30/2014. CLINICAL HISTORY: Pulmonary embolism (PE) suspected, high prob. Patient remains persistently send tachycardic despite increasing dose of metoprolol . FINDINGS: PULMONARY ARTERIES: Pulmonary arteries are adequately opacified for evaluation. No evidence of pulmonary embolism. MEDIASTINUM: Although not tailored for evaluation of the thoracic aorta, there is no evidence of thoracic aneurysm or dissection. Mild thoracic aortic atherosclerosis. Moderate 3-vessel coronary atherosclerosis. LYMPH NODES: No mediastinal, hilar or axillary lymphadenopathy. LUNGS AND PLEURA: Mild dependent atelectasis in the bilateral lower lobes. 5 mm triangular subpleural nodule in the anterior right middle lobe ( image 76 ), unchanged / benign. No follow up is recommended. No focal consolidation or  pulmonary edema. No pleural effusion or pneumothorax. UPPER ABDOMEN: Limited images of the upper abdomen are unremarkable. SOFT TISSUES AND BONES: Moderate superior endplate compression fracture deformity at T10, chronic. No acute soft tissue abnormality. IMPRESSION: 1. No evidence of pulmonary embolism. 2. No acute cardiopulmonary abnormality. Electronically signed by: Pinkie Pebbles MD 05/14/2024 10:02 PM EDT RP Workstation: [id number]         Scheduled Meds:  aspirin   81 mg Oral BID   docusate sodium   100 mg Oral BID   enoxaparin  (LOVENOX ) injection  40 mg Subcutaneous Q24H   famotidine   20 mg Oral Daily   metoprolol  succinate  100 mg Oral Daily   mupirocin  ointment   Nasal BID   pantoprazole   40 mg Oral Daily   polyethylene glycol  17 g Oral BID   rosuvastatin   20 mg Oral Daily   senna-docusate  1 tablet Oral BID   Continuous Infusions:     LOS: 7 days    Time spent: 40 minutes    Cody Applebaum, MD [facility]

## 2024-05-15 NOTE — TOC Progression Note (Signed)
 Transition of Care [facility]) - Progression Note    Patient Details  Name: Cody Preston MRN: [id number] Date of Birth: 06-24-1966  Transition of Care [facility]) CM/SW Contact  Montie LOISE Louder, [place] Phone Number: 05/15/2024, 1:50 PM  Clinical Narrative:     Received pasrr #  [id number]  Expected Discharge Plan: Skilled Nursing Facility Barriers to Discharge: Other (must enter comment) (awaitng SNF bed offer)  Expected Discharge Plan and Services   Discharge Planning Services: CM Consult     Expected Discharge Date: 05/14/24               DME Arranged: Vannie rolling DME Agency: [employer] Date DME Agency Contacted: 05/11/24 Time DME Agency Contacted: 1309 Representative spoke with at DME Agency: [facility] Arranged: PT           Social Determinants of Health (SDOH) Interventions SDOH Screenings   Food Insecurity: No Food Insecurity (05/09/2024)  Housing: Low Risk  (05/09/2024)  Transportation Needs: No Transportation Needs (05/09/2024)  Utilities: Not At Risk (05/09/2024)  Tobacco Use: Low Risk  (05/09/2024)    Readmission Risk Interventions     No data to display

## 2024-05-15 NOTE — TOC Progression Note (Signed)
 Transition of Care [facility]) - Progression Note    Patient Details  Name: Cody Preston MRN: [id number] Date of Birth: 1966-09-14  Transition of Care [facility]) CM/SW Contact  Montie LOISE Louder, [place] Phone Number: 05/15/2024, 4:28 PM  Clinical Narrative:     CSW met with patient at beside- provide bed offers. Patient requested to follow up with [facility] in [place] or [place]. Those are his preferred SNFs because they are close to home. CSW explained [facility] has not offered but will send them a message requesting they review.     Montie Louder, MSW, LCSW Clinical Social Worker    Expected Discharge Plan: Skilled Nursing Facility Barriers to Discharge: Other (must enter comment) (awaitng SNF bed offer)               Expected Discharge Plan and Services   Discharge Planning Services: CM Consult     Expected Discharge Date: 05/14/24               DME Arranged: Vannie rolling DME Agency: [employer] Date DME Agency Contacted: 05/11/24 Time DME Agency Contacted: 1309 Representative spoke with at DME Agency: [facility] Arranged: PT           Social Drivers of Health (SDOH) Interventions SDOH Screenings   Food Insecurity: No Food Insecurity (05/09/2024)  Housing: Low Risk  (05/09/2024)  Transportation Needs: No Transportation Needs (05/09/2024)  Utilities: Not At Risk (05/09/2024)  Tobacco Use: Low Risk  (05/09/2024)    Readmission Risk Interventions     No data to display

## 2024-05-16 DIAGNOSIS — S72002S Fracture of unspecified part of neck of left femur, sequela: Secondary | ICD-10-CM | POA: Diagnosis not present

## 2024-05-16 DIAGNOSIS — I2510 Atherosclerotic heart disease of native coronary artery without angina pectoris: Secondary | ICD-10-CM | POA: Diagnosis not present

## 2024-05-16 LAB — CREATININE, SERUM
Creatinine, Ser: 0.99 mg/dL (ref 0.61–1.24)
GFR, Estimated: >60 mL/min (ref >60)

## 2024-05-16 MED ORDER — BISACODYL 10 MG RE SUPP: 10.0000 mg | Freq: Every day | RECTAL | Status: AC

## 2024-05-16 MED ORDER — BISACODYL 10 MG RE SUPP
10.0000 mg | Freq: Every day | RECTAL | Status: DC
  Administered 2024-05-16 – 2024-05-17 (×2): 10 mg via RECTAL
  Filled 2024-05-16 (×2): qty 1

## 2024-05-16 NOTE — Progress Notes (Signed)
 PROGRESS NOTE    Cody Preston  FMW:[id number] DOB: 11/20/1965 DOA: 05/08/2024 PCP: Leigh Lung, MD    Brief Narrative:  58 year old with history of non-STEMI, coronary stent in 2021, hypertension, hyperlipidemia, GERD, chronic low back pain presented with mechanical fall and left hip pain.  He was found to have left femoral neck fracture.  Admitted with orthopedic consultation.  Hemodynamically stable in the ER.  Also seen by cardiology. Waiting to go to skilled nursing facility.  Subjective:  Patient seen and examined.  He is waiting to go to skilled nursing facility. Patient has denervation injury on his perineal area so he needs suppository and enema to have bowel movements.  He is on a scheduled Dulcolax as well as MiraLAX .  1 dose of suppository again today.  Remains fairly stable.  Pain is controlled.  Assessment & Plan:   Posttraumatic left hip fracture: Status post left IM nail, Dr. Sharl 7/16.   Manage with oral pain medications in order to transition to a SNF. Weightbearing as tolerated. Aspirin  81 mg twice daily for 6 weeks, patient is already on 81 mg daily. Mobilize with PT OT.   Waiting for SNF bed availability.  Coronary artery disease: Currently no evidence of recurrence.  Continued on aspirin , beta-blockers and statins.   Persistent tachycardia and elevated D-dimer: D-dimer 5.  CT angiogram negative.  Duplex negative.  Heart rate improving. Toprol  XL dose increased to 100 mg daily.  Will keep on current regimen.   Hypertension: Stable on beta-blockers.  Hyperlipidemia: On statin.  Hyponatremia: Chronic and stable.   DVT prophylaxis: enoxaparin  (LOVENOX ) injection 40 mg Start: 05/10/24 0800 SCDs Start: 05/09/24 2040SCDs   Code Status: Full code Family Communication: None at bedside Disposition Plan: Status is: Inpatient Remains inpatient appropriate because: Poor mobility.  Medically stable to transition to a SNF when bed available.   Consultants:   Orthopedics Cardiology  Procedures:  ORIF with IM nail, left hip.  Antimicrobials:  None     Objective: Vitals:   05/15/24 1618 05/15/24 1933 05/16/24 0439 05/16/24 0713  BP: 108/69 111/84 129/88 110/80  Pulse: (!) 110 (!) 110 (!) 115 (!) 104  Resp: 14 16 16 16   Temp: 98.1 F (36.7 C) 97.8 F (36.6 C) 98.1 F (36.7 C) 98 F (36.7 C)  TempSrc: Oral Oral Oral Oral  SpO2: 100% 99% 98% 95%  Weight:      Height:       No intake or output data in the 24 hours ending 05/16/24 1126  Filed Weights   05/08/24 1511 05/09/24 1530  Weight: 68.9 kg 67.9 kg    Examination:  General exam: Comfortable and interactive. Respiratory system: Clear to auscultation. Respiratory effort normal. Cardiovascular system: S1 & S2 heard, RRR. No pedal edema.  Tachycardic. Gastrointestinal system: Abdomen is nondistended, soft and nontender. No organomegaly or masses felt. Normal bowel sounds heard. Central nervous system: Alert and oriented. No focal neurological deficits. Extremities: Symmetric 5 x 5 power. Left hip lateral thigh incision clean and dry.  Surrounding ecchymosis present. Distal neurovascular status intact.    Data Reviewed: I have personally reviewed following labs and imaging studies  CBC: Recent Labs  Lab 05/10/24 0545 05/11/24 0858  WBC 12.0* 10.2  NEUTROABS  --  6.9  HGB 11.3* 11.2*  HCT 33.7* 33.8*  MCV 81.8 81.6  PLT 312 316   Basic Metabolic Panel: Recent Labs  Lab 05/10/24 0800 05/11/24 0858 05/14/24 1813 05/16/24 0529  NA 127* 129* 131*  --  K 4.0 3.8 4.0  --   CL 94* 94* 95*  --   CO2 23 24 27   --   GLUCOSE 156* 147* 96  --   BUN 11 14 13   --   CREATININE 0.82 0.96 0.95 0.99  CALCIUM  8.8* 9.1 9.0  --   MG 1.8  --   --   --   PHOS 2.7  --   --   --    GFR: Estimated Creatinine Clearance: 75.2 mL/min (by C-G formula based on SCr of 0.99 mg/dL). Liver Function Tests: No results for input(s): AST, ALT, ALKPHOS, BILITOT, PROT,  ALBUMIN in the last 168 hours. No results for input(s): LIPASE, AMYLASE in the last 168 hours. No results for input(s): AMMONIA in the last 168 hours. Coagulation Profile: No results for input(s): INR, PROTIME in the last 168 hours. Cardiac Enzymes: No results for input(s): CKTOTAL, CKMB, CKMBINDEX, TROPONINI in the last 168 hours. BNP (last 3 results) No results for input(s): PROBNP in the last 8760 hours. HbA1C: No results for input(s): HGBA1C in the last 72 hours. CBG: No results for input(s): GLUCAP in the last 168 hours. Lipid Profile: No results for input(s): CHOL, HDL, LDLCALC, TRIG, CHOLHDL, LDLDIRECT in the last 72 hours. Thyroid  Function Tests: No results for input(s): TSH, T4TOTAL, FREET4, T3FREE, THYROIDAB in the last 72 hours.  Anemia Panel: No results for input(s): VITAMINB12, FOLATE, FERRITIN, TIBC, IRON, RETICCTPCT in the last 72 hours. Sepsis Labs: No results for input(s): PROCALCITON, LATICACIDVEN in the last 168 hours.  Recent Results (from the past 240 hours)  MRSA Next Gen by PCR, Nasal     Status: Abnormal   Collection Time: 05/09/24  5:13 PM   Specimen: Nasal Mucosa; Nasal Swab  Result Value Ref Range Status   MRSA by PCR Next Gen DETECTED (A) NOT DETECTED Final    Comment: RESULT CALLED TO, READ BACK BY AND VERIFIED WITH: RN DRISS ZINE [phone] FCP (NOTE) The GeneXpert MRSA Assay (FDA approved for NASAL specimens only), is one component of a comprehensive MRSA colonization surveillance program. It is not intended to diagnose MRSA infection nor to guide or monitor treatment for MRSA infections. Test performance is not FDA approved in patients less than 80 years old. Performed at [facility], [address] [place], [place] [address]          Radiology Studies: VAS US  LOWER EXTREMITY VENOUS (DVT) Result Date: 05/15/2024  Lower Venous DVT Study Patient Name:  Cody Preston   Date of Exam:   05/15/2024 Medical Rec #: [id number]    Accession #:    [id number] Date of Birth: [birth year]    Patient Gender: M Patient Age:   58 years Exam Location:  [facility] Procedure:      VAS US  LOWER EXTREMITY VENOUS (DVT) Referring Phys: RENATO APPLEBAUM --------------------------------------------------------------------------------  Indications: Pain.  Risk Factors: Trauma Left hip fracture, s/p surgical repair (05/09/2024) Per patient - distant history of DVT post surgical procedure. Limitations: Poor ultrasound/tissue interface and patient immobility. Comparison Study: No previous exams on file Performing Technologist: Jody Hill RVT, RDMS  Examination Guidelines: A complete evaluation includes B-mode imaging, spectral Doppler, color Doppler, and power Doppler as needed of all accessible portions of each vessel. Bilateral testing is considered an integral part of a complete examination. Limited examinations for reoccurring indications may be performed as noted. The reflux portion of the exam is performed with the patient in reverse Trendelenburg.  +---------+---------------+---------+-----------+----------+-------------------+ RIGHT  CompressibilityPhasicitySpontaneityPropertiesThrombus Aging      +---------+---------------+---------+-----------+----------+-------------------+ CFV      Full           Yes      Yes                                      +---------+---------------+---------+-----------+----------+-------------------+ SFJ      Full                                                             +---------+---------------+---------+-----------+----------+-------------------+ FV Prox  Full           Yes      Yes                                      +---------+---------------+---------+-----------+----------+-------------------+ FV Mid   Full           Yes      Yes                                       +---------+---------------+---------+-----------+----------+-------------------+ FV DistalFull           Yes      Yes                                      +---------+---------------+---------+-----------+----------+-------------------+ PFV      Full                                                             +---------+---------------+---------+-----------+----------+-------------------+ POP      Full           Yes      Yes                                      +---------+---------------+---------+-----------+----------+-------------------+ PTV      Full                                                             +---------+---------------+---------+-----------+----------+-------------------+ PERO     Full                                         Not well visualized +---------+---------------+---------+-----------+----------+-------------------+   +---------+---------------+---------+-----------+----------+--------------+ LEFT     CompressibilityPhasicitySpontaneityPropertiesThrombus Aging +---------+---------------+---------+-----------+----------+--------------+ CFV      Full           Yes      Yes                                 +---------+---------------+---------+-----------+----------+--------------+  SFJ      Full                                                        +---------+---------------+---------+-----------+----------+--------------+ FV Prox  Full           Yes      Yes                                 +---------+---------------+---------+-----------+----------+--------------+ FV Mid   Full           Yes      Yes                                 +---------+---------------+---------+-----------+----------+--------------+ FV DistalFull           Yes      Yes                                 +---------+---------------+---------+-----------+----------+--------------+ PFV      Full                                                         +---------+---------------+---------+-----------+----------+--------------+ POP      Full           Yes      Yes                                 +---------+---------------+---------+-----------+----------+--------------+ PTV      Full                                                        +---------+---------------+---------+-----------+----------+--------------+ PERO     Full                                                        +---------+---------------+---------+-----------+----------+--------------+     *See table(s) above for measurements and observations. Electronically signed by Norman Serve on 05/15/2024 at 3:24:29 PM.    Final    CT Angio Chest Pulmonary Embolism (PE) W or WO Contrast Result Date: 05/14/2024 EXAM: CTA of the Chest with contrast for PE 05/14/2024 09:55:45 PM TECHNIQUE: CTA of the chest was performed after the administration of intravenous contrast. Multiplanar reformatted images are provided for review. MIP images are provided for review. Automated exposure control, iterative reconstruction, and/or weight based adjustment of the mA/kV was utilized to reduce the radiation dose to as low as reasonably achievable. COMPARISON: CT chest dated 08/30/2014. CLINICAL HISTORY: Pulmonary embolism (PE) suspected, high prob. Patient remains persistently send tachycardic despite increasing dose of metoprolol . FINDINGS: PULMONARY ARTERIES:  Pulmonary arteries are adequately opacified for evaluation. No evidence of pulmonary embolism. MEDIASTINUM: Although not tailored for evaluation of the thoracic aorta, there is no evidence of thoracic aneurysm or dissection. Mild thoracic aortic atherosclerosis. Moderate 3-vessel coronary atherosclerosis. LYMPH NODES: No mediastinal, hilar or axillary lymphadenopathy. LUNGS AND PLEURA: Mild dependent atelectasis in the bilateral lower lobes. 5 mm triangular subpleural nodule in the anterior right middle lobe ( image 76 ), unchanged /  benign. No follow up is recommended. No focal consolidation or pulmonary edema. No pleural effusion or pneumothorax. UPPER ABDOMEN: Limited images of the upper abdomen are unremarkable. SOFT TISSUES AND BONES: Moderate superior endplate compression fracture deformity at T10, chronic. No acute soft tissue abnormality. IMPRESSION: 1. No evidence of pulmonary embolism. 2. No acute cardiopulmonary abnormality. Electronically signed by: Pinkie Pebbles MD 05/14/2024 10:02 PM EDT RP Workstation: [id number]         Scheduled Meds:  aspirin   81 mg Oral BID   docusate sodium   100 mg Oral BID   enoxaparin  (LOVENOX ) injection  40 mg Subcutaneous Q24H   famotidine   20 mg Oral Daily   metoprolol  succinate  100 mg Oral Daily   mupirocin  ointment   Nasal BID   pantoprazole   40 mg Oral Daily   polyethylene glycol  17 g Oral BID   rosuvastatin   20 mg Oral Daily   senna-docusate  1 tablet Oral BID   Continuous Infusions:     LOS: 8 days    Time spent: 40 minutes    Renato Applebaum, MD [facility]

## 2024-05-16 NOTE — Telephone Encounter (Signed)
   Name: Cody Preston  DOB: [birth year]  MRN: [id number]  Primary Cardiologist: Gordy Bergamo, MD   Preoperative team, please contact this patient and set up a phone call appointment for further preoperative risk assessment. Please obtain consent and complete medication review. Thank you for your help. Was admitted for fall and in now in [facility].  Echo was completed and was essentially normal.   I confirm that guidance regarding antiplatelet and oral anticoagulation therapy has been completed and, if necessary, noted below.  Per office protocol, if patient is without any new symptoms or concerns at the time of their virtual visit, he may hold ASA for 7 days prior to procedure. Please resume ASA as soon as possible postprocedure, at the discretion of the surgeon.    I also confirmed the patient resides in the state of  . As per [facility] Medical Board telemedicine laws, the patient must reside in the state in which the provider is licensed.   [name], NP 05/16/2024, 9:27 AM [facility]

## 2024-05-16 NOTE — Plan of Care (Signed)
  Problem: Education: Goal: Knowledge of medication regimen will be met for pain relief regimen by discharge Outcome: Progressing Goal: Understanding of ways to prevent infection will improve by discharge Outcome: Progressing   Problem: Coping: Goal: Ability to verbalize feelings will improve by discharge Outcome: Progressing Goal: Family members realistic understanding of the patients condition will improve by discharge Outcome: Progressing

## 2024-05-16 NOTE — Progress Notes (Signed)
 Physical Therapy Treatment Patient Details Name: Cody Preston MRN: [id number] DOB: 22-Jul-1966 Today's Date: 05/16/2024   History of Present Illness [name] is a 58 y.o. male who presented to ED 05/08/24 following ground level fall at a gas station. X-ray revealed a left femoral neck fracture. Pt s/p Lt intramedullary rod and nail 7/16. PMHx: NSTEMI, stent in 2021, HTN, HLD, GERD, and chronic low back pain.    PT Comments  Pt resting in bed on arrival, pleasant and agreeable to session with steady progress towards acute goals. Pt continues to require up to min A during gait with RW for support as pt with decreased LE strength, with knees flexed and increased reliance on UE support on RW. Pt able to perform x5 marching with RLE to progress SLS on L to ultimately progress ability to ascend steps. Pt up in chair at end of session with all needs met. Pt continues to benefit from skilled PT services to progress toward functional mobility goals.     If plan is discharge home, recommend the following: A little help with bathing/dressing/bathroom;Assistance with cooking/housework;Assist for transportation;Help with stairs or ramp for entrance;A lot of help with walking and/or transfers   Can travel by private vehicle     No  Equipment Recommendations  Rolling walker (2 wheels) (recommend stair lift or ramp for home)    Recommendations for Other Services       Precautions / Restrictions Precautions Precautions: Fall Recall of Precautions/Restrictions: Intact Precaution/Restrictions Comments: decreased L dorsiflexion and hip flexion, recommended foot up brace to help with this but pt defers Restrictions Weight Bearing Restrictions Per Provider Order: Yes LLE Weight Bearing Per Provider Order: Weight bearing as tolerated     Mobility  Bed Mobility Overal bed mobility: Needs Assistance Bed Mobility: Supine to Sit     Supine to sit: Min assist, HOB elevated, Used rails     General bed  mobility comments: min A to manage LLE to and off EOB    Transfers Overall transfer level: Needs assistance Equipment used: Rolling walker (2 wheels) Transfers: Sit to/from Stand, Bed to chair/wheelchair/BSC Sit to Stand: Contact guard assist           General transfer comment: pt standing from EOB at lowest height, good hand placement    Ambulation/Gait Ambulation/Gait assistance: Min assist Gait Distance (Feet): 105 Feet Assistive device: Rolling walker (2 wheels) Gait Pattern/deviations: Decreased stride length, Decreased weight shift to left, Antalgic, Trunk flexed, Step-to pattern, Decreased dorsiflexion - left, Decreased stance time - left, Decreased step length - right, Knee flexed in stance - left, Knee flexed in stance - right, Narrow base of support, Shuffle Gait velocity: decreased, grossly <0.1 m/s     General Gait Details: pt with short step-to pattern with narrow BOS with bil knees flexed theoughout. cues for upright posture and to keep hands on RW and to activate triceps/elbows more to offload L hip/femur. no overt LOB MinA for RW management at times and for LLE improved step length.   Stairs             Wheelchair Mobility     Tilt Bed    Modified Rankin (Stroke Patients Only)       Balance Overall balance assessment: Needs assistance, History of Falls Sitting-balance support: Bilateral upper extremity supported, Feet supported Sitting balance-Leahy Scale: Fair Sitting balance - Comments: Pt sat EOB with supervision.   Standing balance support: Bilateral upper extremity supported, During functional activity, Reliant on assistive device  for balance Standing balance-Leahy Scale: Poor Standing balance comment: Pt dependent on RW                            Communication Communication Communication: No apparent difficulties  Cognition Arousal: Alert Behavior During Therapy: Anxious, Flat affect   PT - Cognitive impairments: Problem  solving, Safety/Judgement, Sequencing                       PT - Cognition Comments: pt with very poor insight into deficits Following commands: Intact      Cueing Cueing Techniques: Verbal cues, Visual cues, Tactile cues  Exercises Other Exercises Other Exercises: standing marching RLE x5 for improved ability to perform SLS on L to progress stair training    General Comments General comments (skin integrity, edema, etc.): assisted pt to place breakfast order      Pertinent Vitals/Pain Pain Assessment Pain Assessment: Faces Faces Pain Scale: Hurts little more Pain Location: L hip/thigh/knee Pain Descriptors / Indicators: Guarding, Grimacing, Discomfort, Sharp, Moaning Pain Intervention(s): Premedicated before session, Monitored during session, Limited activity within patient's tolerance    Home Living                          Prior Function            PT Goals (current goals can now be found in the care plan section) Acute Rehab PT Goals Patient Stated Goal: Return Home as soon as possible PT Goal Formulation: With patient Time For Goal Achievement: 05/24/24 Progress towards PT goals: Progressing toward goals    Frequency    Min 2X/week      PT Plan      Co-evaluation              AM-PAC PT 6 Clicks Mobility   Outcome Measure  Help needed turning from your back to your side while in a flat bed without using bedrails?: A Little Help needed moving from lying on your back to sitting on the side of a flat bed without using bedrails?: A Little Help needed moving to and from a bed to a chair (including a wheelchair)?: A Little Help needed standing up from a chair using your arms (e.g., wheelchair or bedside chair)?: A Little Help needed to walk in hospital room?: A Little Help needed climbing 3-5 steps with a railing? : Total 6 Click Score: 16    End of Session Equipment Utilized During Treatment: Gait belt Activity Tolerance:  Patient tolerated treatment well;Patient limited by pain Patient left: with call bell/phone within reach;in chair;with chair alarm set Nurse Communication: Mobility status PT Visit Diagnosis: Difficulty in walking, not elsewhere classified (R26.2);Unsteadiness on feet (R26.81);History of falling (Z91.81)     Time: 9099-9076 PT Time Calculation (min) (ACUTE ONLY): 23 min  Charges:    $Gait Training: 23-37 mins PT General Charges $$ ACUTE PT VISIT: 1 Visit                     Jamayia Croker R. PTA Acute Rehabilitation Services Office: [phone]   Therisa CHRISTELLA Boor 05/16/2024, 9:31 AM

## 2024-05-16 NOTE — Discharge Summary (Signed)
 Physician Discharge Summary  Cody Preston FMW:[id number] DOB: 05-06-1966 DOA: 05/08/2024  PCP: Leigh Lung, MD  Admit date: 05/08/2024 Discharge date: 05/17/2024  Admitted From: Home Disposition: SNF   Recommendations for Outpatient Follow-up:  Follow up with PCP in 1-2 weeks Please obtain BMP/CBC in one week Orthopedics to schedule follow-up  Discharge Condition: Stable CODE STATUS: Full code Diet recommendation: Low-salt diet.  Discharge summary: 58 year old with history of non-STEMI, coronary stent in 2021, hypertension, hyperlipidemia, GERD, chronic low back pain presented with mechanical fall and left hip pain.  He was found to have left femoral neck fracture.  Admitted and underwent ORIF left hip. Treated for following conditions.   Assessment & Plan of care:    Posttraumatic left hip fracture: Status post left IM nail, Dr. Sharl 7/16.   Surgically stable as per surgery.  Weightbearing as tolerated.  DVT prophylaxis aspirin  81 mg twice daily for 6 weeks then continue his usual dose of aspirin .  Use a stool softener and laxatives to avoid constipation.  He has chronic constipation and perianal nerve damage. Benefits with dulcolax suppository.  Stayed in the hospital to work with PT OT.   Orthopedics to schedule follow-up.   Coronary artery disease: Currently no evidence of recurrence.  Continued on aspirin , beta-blockers and statins.  He was persistently tachycardic despite controlling his surgical pain.  CT angiogram and duplexes were negative for thromboembolism. Toprol  XL dose increased to 100 mg daily.  Heart rate is well-controlled now. Patient was on losartan .  Blood pressures are low normal.  In order to make room for Toprol  XL, discontinuing losartan .   Hypertension: Stable on beta-blockers.  Discontinuing losartan  to make room for beta-blockers.   Hyperlipidemia: On statin.   Hyponatremia: Chronic and stable.  He stable to discharge to a skilled nursing facility.   Orthopedics to schedule follow-up.   Discharge Diagnoses:  Principal Problem:   Hip fracture (HCC) Active Problems:   Lung nodule   CAD (coronary artery disease)   Hypercholesteremia   Primary hypertension    Discharge Instructions  Discharge Instructions     Ambulatory referral to Occupational Therapy   Complete by: As directed    Ambulatory referral to Physical Therapy   Complete by: As directed    Diet - low sodium heart healthy   Complete by: As directed    Increase activity slowly   Complete by: As directed    Leave dressing on - Keep it clean, dry, and intact until clinic visit   Complete by: As directed       Allergies as of 05/17/2024       Reactions   Bee Venom Swelling, Other (See Comments)   Finger became blue and he had to be rushed to a hospital   Wasp Venom Swelling, Other (See Comments)   Skin turns black and blue at site stung, near syncope        Medication List     PAUSE taking these medications    aspirin  81 MG chewable tablet Wait to take this until: June 19, 2024 Chew 1 tablet (81 mg total) by mouth daily. You also have another medication with the same name that you may need to continue taking.       STOP taking these medications    atorvastatin 20 MG tablet Commonly known as: LIPITOR   losartan  25 MG tablet Commonly known as: COZAAR    traMADol  50 MG tablet Commonly known as: ULTRAM        TAKE these medications  acetaminophen  325 MG tablet Commonly known as: TYLENOL  Take 650 mg by mouth every 6 (six) hours as needed.   aspirin  81 MG chewable tablet Chew 1 tablet (81 mg total) by mouth 2 (two) times daily. What changed: Another medication with the same name was paused. Ask your nurse or doctor if you should take this medication.   bisacodyl  10 MG suppository Commonly known as: DULCOLAX Place 1 suppository (10 mg total) rectally daily.   HYDROcodone -acetaminophen  7.5-325 MG tablet Commonly known as: NORCO Take  1 tablet by mouth every 4 (four) hours as needed for moderate pain (pain score 4-6).   metoprolol  succinate 100 MG 24 hr tablet Commonly known as: TOPROL -XL Take 1 tablet (100 mg total) by mouth daily. Take with or immediately following a meal. What changed:  medication strength how much to take   nitroGLYCERIN  0.4 MG SL tablet Commonly known as: NITROSTAT  Place 1 tablet (0.4 mg total) under the tongue every 5 (five) minutes as needed for chest pain.   pantoprazole  40 MG tablet Commonly known as: PROTONIX  Take 40 mg by mouth daily.   polyethylene glycol 17 g packet Commonly known as: MIRALAX  / GLYCOLAX  Take 17 g by mouth 2 (two) times daily.   rosuvastatin  20 MG tablet Commonly known as: CRESTOR  Take 1 tablet (20 mg total) by mouth daily.   senna-docusate 8.6-50 MG tablet Commonly known as: Senokot-S Take 1 tablet by mouth 2 (two) times daily.               Durable Medical Equipment  (From admission, onward)           Start     Ordered   05/10/24 1821  For home use only DME Walker rolling  Once       Question Answer Comment  Walker: With 5 Inch Wheels   Patient needs a walker to treat with the following condition Fx      05/10/24 1821              Discharge Care Instructions  (From admission, onward)           Start     Ordered   05/14/24 0000  Leave dressing on - Keep it clean, dry, and intact until clinic visit        05/14/24 0850            Contact information for follow-up providers     Sharl Selinda Dover, MD Follow up in 2 week(s).   Specialty: Orthopedic Surgery Why: For suture removal, For wound re-check Contact information: [address] [place] [address] [phone]         Leigh Lung, MD Follow up.   Specialty: Family Medicine Contact information: [address] [place] [address] [phone]         [facility] Follow up.   Specialty:  Rehabilitation Why: referral made for outpatient PT and OT services, please call and arrange appointment time Contact information: [address] [place]  [address] [phone]        [facility] Follow up.   Specialty: Home Health Services Why: home health services will be provided by [facility], start of care within 48 hours post discharge Contact information: [address] [place] [address] [phone]              Contact information for after-discharge care     Destination  [place] [facility] [place], [place] .   Service: Skilled Nursing Contact information: [address] [place]  [address] [phone]                    Allergies  Allergen Reactions   Bee Venom Swelling and Other (See Comments)    Finger became blue and he had to be rushed to a hospital   Wasp Venom Swelling and Other (See Comments)    Skin turns black and blue at site stung, near syncope    Consultations: Orthopedics Cardiology   Procedures/Studies: VAS US  LOWER EXTREMITY VENOUS (DVT) Result Date: 05/15/2024  Lower Venous DVT Study Patient Name:  Cody Preston  Date of Exam:   05/15/2024 Medical Rec #: [id number]    Accession #:    [id number] Date of Birth: Apr 13, 1966    Patient Gender: M Patient Age:   10 years Exam Location:  [facility] Procedure:      VAS US  LOWER EXTREMITY VENOUS (DVT) Referring Phys: RENATO APPLEBAUM --------------------------------------------------------------------------------  Indications: Pain.  Risk Factors: Trauma Left hip fracture, s/p surgical repair (05/09/2024) Per patient - distant history of DVT post surgical procedure. Limitations: Poor ultrasound/tissue interface and patient immobility. Comparison Study: No previous exams on file Performing Technologist: Jody Hill RVT, RDMS  Examination Guidelines: A complete evaluation includes B-mode imaging, spectral Doppler, color  Doppler, and power Doppler as needed of all accessible portions of each vessel. Bilateral testing is considered an integral part of a complete examination. Limited examinations for reoccurring indications may be performed as noted. The reflux portion of the exam is performed with the patient in reverse Trendelenburg.  +---------+---------------+---------+-----------+----------+-------------------+ RIGHT    CompressibilityPhasicitySpontaneityPropertiesThrombus Aging      +---------+---------------+---------+-----------+----------+-------------------+ CFV      Full           Yes      Yes                                      +---------+---------------+---------+-----------+----------+-------------------+ SFJ      Full                                                             +---------+---------------+---------+-----------+----------+-------------------+ FV Prox  Full           Yes      Yes                                      +---------+---------------+---------+-----------+----------+-------------------+ FV Mid   Full           Yes      Yes                                      +---------+---------------+---------+-----------+----------+-------------------+ FV DistalFull           Yes      Yes                                      +---------+---------------+---------+-----------+----------+-------------------+  PFV      Full                                                             +---------+---------------+---------+-----------+----------+-------------------+ POP      Full           Yes      Yes                                      +---------+---------------+---------+-----------+----------+-------------------+ PTV      Full                                                             +---------+---------------+---------+-----------+----------+-------------------+ PERO     Full                                         Not well visualized  +---------+---------------+---------+-----------+----------+-------------------+   +---------+---------------+---------+-----------+----------+--------------+ LEFT     CompressibilityPhasicitySpontaneityPropertiesThrombus Aging +---------+---------------+---------+-----------+----------+--------------+ CFV      Full           Yes      Yes                                 +---------+---------------+---------+-----------+----------+--------------+ SFJ      Full                                                        +---------+---------------+---------+-----------+----------+--------------+ FV Prox  Full           Yes      Yes                                 +---------+---------------+---------+-----------+----------+--------------+ FV Mid   Full           Yes      Yes                                 +---------+---------------+---------+-----------+----------+--------------+ FV DistalFull           Yes      Yes                                 +---------+---------------+---------+-----------+----------+--------------+ PFV      Full                                                        +---------+---------------+---------+-----------+----------+--------------+  POP      Full           Yes      Yes                                 +---------+---------------+---------+-----------+----------+--------------+ PTV      Full                                                        +---------+---------------+---------+-----------+----------+--------------+ PERO     Full                                                        +---------+---------------+---------+-----------+----------+--------------+     *See table(s) above for measurements and observations. Electronically signed by Norman Serve on 05/15/2024 at 3:24:29 PM.    Final    CT Angio Chest Pulmonary Embolism (PE) W or WO Contrast Result Date: 05/14/2024 EXAM: CTA of the Chest with contrast for PE  05/14/2024 09:55:45 PM TECHNIQUE: CTA of the chest was performed after the administration of intravenous contrast. Multiplanar reformatted images are provided for review. MIP images are provided for review. Automated exposure control, iterative reconstruction, and/or weight based adjustment of the mA/kV was utilized to reduce the radiation dose to as low as reasonably achievable. COMPARISON: CT chest dated 08/30/2014. CLINICAL HISTORY: Pulmonary embolism (PE) suspected, high prob. Patient remains persistently send tachycardic despite increasing dose of metoprolol . FINDINGS: PULMONARY ARTERIES: Pulmonary arteries are adequately opacified for evaluation. No evidence of pulmonary embolism. MEDIASTINUM: Although not tailored for evaluation of the thoracic aorta, there is no evidence of thoracic aneurysm or dissection. Mild thoracic aortic atherosclerosis. Moderate 3-vessel coronary atherosclerosis. LYMPH NODES: No mediastinal, hilar or axillary lymphadenopathy. LUNGS AND PLEURA: Mild dependent atelectasis in the bilateral lower lobes. 5 mm triangular subpleural nodule in the anterior right middle lobe ( image 76 ), unchanged / benign. No follow up is recommended. No focal consolidation or pulmonary edema. No pleural effusion or pneumothorax. UPPER ABDOMEN: Limited images of the upper abdomen are unremarkable. SOFT TISSUES AND BONES: Moderate superior endplate compression fracture deformity at T10, chronic. No acute soft tissue abnormality. IMPRESSION: 1. No evidence of pulmonary embolism. 2. No acute cardiopulmonary abnormality. Electronically signed by: Pinkie Pebbles MD 05/14/2024 10:02 PM EDT RP Workstation: [id number]   DG HIP UNILAT WITH PELVIS 2-3 VIEWS LEFT Result Date: 05/09/2024 CLINICAL DATA:  Known left femoral fracture EXAM: DG HIP (WITH OR WITHOUT PELVIS) 2-3V LEFT COMPARISON:  05/08/2024 FLUOROSCOPY TIME:  Radiation Exposure Index (as provided by the fluoroscopic device): 5.97 mGy If the device does  not provide the exposure index: Fluoroscopy Time:  59 seconds Number of Acquired Images:  5 FINDINGS: Initial images demonstrate medullary rod within the proximal left femur. Fixation screw was then placed traversing the femoral neck. Distal fixation screw in the proximal femoral shaft is noted as well. Fracture fragments are in near anatomic alignment. IMPRESSION: ORIF of proximal left femoral fracture. Electronically Signed   By: Oneil Devonshire M.D.   On: 05/09/2024 21:40   DG C-Arm 1-60 Min-No Report Result Date: 05/09/2024 Fluoroscopy was utilized by the  requesting physician.  No radiographic interpretation.   ECHOCARDIOGRAM COMPLETE Result Date: 05/09/2024    ECHOCARDIOGRAM REPORT   Patient Name:   Cody Preston Date of Exam: 05/09/2024 Medical Rec #:  [id number]   Height:       66.0 in Accession #:    [id number]  Weight:       152.0 lb Date of Birth:  [birth year]   BSA:          1.780 m Patient Age:    57 years    BP:           119/92 mmHg Patient Gender: M           HR:           109 bpm. Exam Location:  Inpatient Procedure: 2D Echo, Color Doppler and Cardiac Doppler (Both Spectral and Color            Flow Doppler were utilized during procedure). Indications:    I25.5 Ischemic cardiomyopathy, Pre-op  History:        Patient has prior history of Echocardiogram examinations, most                 recent 04/20/2020. CAD; Risk Factors:Hypertension and                 Dyslipidemia.  Sonographer:    Damien Senior RDCS Referring Phys: [id number] ZANE ADAMS  Sonographer Comments: Patient laying towards right side, unable to reposition due to hip fracture IMPRESSIONS  1. Left ventricular ejection fraction, by estimation, is 50 to 55%. The left ventricle has low normal function. The left ventricle demonstrates global hypokinesis. Left ventricular diastolic parameters are indeterminate.  2. Right ventricular systolic function is normal. The right ventricular size is normal.  3. The mitral valve is normal in structure. No  evidence of mitral valve regurgitation. No evidence of mitral stenosis.  4. The aortic valve is normal in structure. Aortic valve regurgitation is not visualized. No aortic stenosis is present.  5. The inferior vena cava is normal in size with greater than 50% respiratory variability, suggesting right atrial pressure of 3 mmHg. FINDINGS  Left Ventricle: Left ventricular ejection fraction, by estimation, is 50 to 55%. The left ventricle has low normal function. The left ventricle demonstrates global hypokinesis. The left ventricular internal cavity size was normal in size. There is no left ventricular hypertrophy. Left ventricular diastolic parameters are indeterminate. Right Ventricle: The right ventricular size is normal. No increase in right ventricular wall thickness. Right ventricular systolic function is normal. Left Atrium: Left atrial size was normal in size. Right Atrium: Right atrial size was normal in size. Pericardium: There is no evidence of pericardial effusion. Mitral Valve: The mitral valve is normal in structure. No evidence of mitral valve regurgitation. No evidence of mitral valve stenosis. Tricuspid Valve: The tricuspid valve is normal in structure. Tricuspid valve regurgitation is not demonstrated. No evidence of tricuspid stenosis. Aortic Valve: The aortic valve is normal in structure. Aortic valve regurgitation is not visualized. No aortic stenosis is present. Pulmonic Valve: The pulmonic valve was normal in structure. Pulmonic valve regurgitation is not visualized. No evidence of pulmonic stenosis. Aorta: The aortic root is normal in size and structure. Venous: The inferior vena cava is normal in size with greater than 50% respiratory variability, suggesting right atrial pressure of 3 mmHg. IAS/Shunts: No atrial level shunt detected by color flow Doppler.  LEFT VENTRICLE PLAX 2D LVIDd:         3.90  cm LVIDs:         2.50 cm LV PW:         0.80 cm LV IVS:        0.80 cm LVOT diam:     2.20 cm  LV SV:         57 LV SV Index:   32 LVOT Area:     3.80 cm  LV Volumes (MOD) LV vol d, MOD A2C: 63.6 ml LV vol s, MOD A2C: 34.5 ml LV SV MOD A2C:     29.1 ml RIGHT VENTRICLE RV S prime:     11.60 cm/s TAPSE (M-mode): 1.6 cm LEFT ATRIUM             Index        RIGHT ATRIUM          Index LA diam:        3.30 cm 1.85 cm/m   RA Area:     8.59 cm LA Vol (A2C):   37.1 ml 20.85 ml/m  RA Volume:   12.90 ml 7.25 ml/m LA Vol (A4C):   38.1 ml 21.41 ml/m LA Biplane Vol: 38.7 ml 21.75 ml/m  AORTIC VALVE LVOT Vmax:   84.60 cm/s LVOT Vmean:  63.300 cm/s LVOT VTI:    0.149 m  AORTA Ao Root diam: 3.30 cm Ao Asc diam:  3.10 cm  SHUNTS Systemic VTI:  0.15 m Systemic Diam: 2.20 cm Kardie Tobb DO Electronically signed by Dub Huntsman DO Signature Date/Time: 05/09/2024/3:06:04 PM    Final    CT PELVIS WO CONTRAST Result Date: 05/08/2024 CLINICAL DATA:  [place]lem[place].  Left femoral neck fracture. EXAM: CT PELVIS WITHOUT CONTRAST TECHNIQUE: Multidetector CT imaging of the pelvis was performed following the standard protocol without intravenous contrast. RADIATION DOSE REDUCTION: This exam was performed according to the departmental dose-optimization program which includes automated exposure control, adjustment of the mA and/or kV according to patient size and/or use of iterative reconstruction technique. COMPARISON:  Radiographs 05/08/2024 FINDINGS: There is a complex comminuted intertrochanteric fracture of the left hip with significant varus deformity. This fracture also involves the base of the cervical neck but the cervical neck is otherwise intact. The femoral head is normally located. No acetabular fracture. No right hip fracture. The pubic symphysis and SI joints are intact. No acute pelvic fractures. Remote healed right-sided pubic rami fractures. No significant intrapelvic abnormalities are identified. Age advanced vascular disease. Stable large complex right scrotal lesion possible complex hydrocele or hematoma. This was also  seen on the prior ultrasound examination January. IMPRESSION: 1. Complex comminuted intertrochanteric fracture of the left hip with significant varus deformity. 2. No acetabular fracture. 3. No right hip fracture. 4. Remote healed right-sided pubic rami fractures. 5. Stable large complex right scrotal lesion possible complex hydrocele or hematoma. This was also seen on the prior ultrasound examination January. Electronically Signed   By: MYRTIS Stammer M.D.   On: 05/08/2024 22:51   DG Femur Min 2 Views Left Result Date: 05/08/2024 CLINICAL DATA:  Fall EXAM: LEFT FEMUR 2 VIEWS COMPARISON:  None Available. FINDINGS: There is an acute left femoral neck fracture with superolateral displacement of the distal fracture fragment. There is no dislocation. Peripheral vascular calcifications are present. Soft tissues are otherwise within normal limits. IMPRESSION: Acute left femoral neck fracture. Electronically Signed   By: Greig Pique M.D.   On: 05/08/2024 17:21   (Echo, Carotid, EGD, Colonoscopy, ERCP)    Subjective: Patient seen and examined.  Patient is  very frustrated and wants to get out of the hospital as soon as possible.  Pain is controlled with oral medications.  Bowel movements are regulated only after Dulcolax suppository.   Discharge Exam: Vitals:   05/17/24 0346 05/17/24 0800  BP: 114/85 120/76  Pulse: 92 (!) 103  Resp: 16 16  Temp: 97.9 F (36.6 C) 97.8 F (36.6 C)  SpO2: 98% 98%   Vitals:   05/16/24 1525 05/16/24 1938 05/17/24 0346 05/17/24 0800  BP: 109/81 117/79 114/85 120/76  Pulse: 91 (!) 104 92 (!) 103  Resp: 15 16 16 16   Temp: 98.2 F (36.8 C) (!) 97.3 F (36.3 C) 97.9 F (36.6 C) 97.8 F (36.6 C)  TempSrc: Oral Oral Oral Oral  SpO2: 98% 100% 98% 98%  Weight:      Height:        General: Pt is alert, awake, not in acute distress Cardiovascular: RRR, S1/S2 +, no rubs, no gallops Respiratory: CTA bilaterally, no wheezing, no rhonchi Abdominal: Soft, NT, ND, bowel  sounds + Extremities: no edema, no cyanosis Left lateral thigh incision clean and dry. Ecchymosis present.     The results of significant diagnostics from this hospitalization (including imaging, microbiology, ancillary and laboratory) are listed below for reference.     Microbiology: Recent Results (from the past 240 hours)  MRSA Next Gen by PCR, Nasal     Status: Abnormal   Collection Time: 05/09/24  5:13 PM   Specimen: Nasal Mucosa; Nasal Swab  Result Value Ref Range Status   MRSA by PCR Next Gen DETECTED (A) NOT DETECTED Final    Comment: RESULT CALLED TO, READ BACK BY AND VERIFIED WITH: RN DRISS ZINE [id number] FCP (NOTE) The GeneXpert MRSA Assay (FDA approved for NASAL specimens only), is one component of a comprehensive MRSA colonization surveillance program. It is not intended to diagnose MRSA infection nor to guide or monitor treatment for MRSA infections. Test performance is not FDA approved in patients less than 34 years old. Performed at [facility], [address], [place], [place] [address]      Labs: BNP (last 3 results) No results for input(s): BNP in the last 8760 hours. Basic Metabolic Panel: Recent Labs  Lab 05/11/24 0858 05/14/24 1813 05/16/24 0529  NA 129* 131*  --   K 3.8 4.0  --   CL 94* 95*  --   CO2 24 27  --   GLUCOSE 147* 96  --   BUN 14 13  --   CREATININE 0.96 0.95 0.99  CALCIUM  9.1 9.0  --    Liver Function Tests: No results for input(s): AST, ALT, ALKPHOS, BILITOT, PROT, ALBUMIN in the last 168 hours. No results for input(s): LIPASE, AMYLASE in the last 168 hours. No results for input(s): AMMONIA in the last 168 hours. CBC: Recent Labs  Lab 05/11/24 0858  WBC 10.2  NEUTROABS 6.9  HGB 11.2*  HCT 33.8*  MCV 81.6  PLT 316   Cardiac Enzymes: No results for input(s): CKTOTAL, CKMB, CKMBINDEX, TROPONINI in the last 168 hours. BNP: Invalid input(s): POCBNP CBG: No results for input(s):  GLUCAP in the last 168 hours. D-Dimer Recent Labs    05/14/24 1408  DDIMER 5.04*   Hgb A1c No results for input(s): HGBA1C in the last 72 hours. Lipid Profile No results for input(s): CHOL, HDL, LDLCALC, TRIG, CHOLHDL, LDLDIRECT in the last 72 hours. Thyroid  function studies No results for input(s): TSH, T4TOTAL, T3FREE, THYROIDAB in the last 72  hours.  Invalid input(s): FREET3  Anemia work up No results for input(s): VITAMINB12, FOLATE, FERRITIN, TIBC, IRON, RETICCTPCT in the last 72 hours. Urinalysis No results found for: COLORURINE, APPEARANCEUR, LABSPEC, PHURINE, GLUCOSEU, HGBUR, BILIRUBINUR, KETONESUR, PROTEINUR, UROBILINOGEN, NITRITE, LEUKOCYTESUR Sepsis Labs Recent Labs  Lab 05/11/24 0858  WBC 10.2   Microbiology Recent Results (from the past 240 hours)  MRSA Next Gen by PCR, Nasal     Status: Abnormal   Collection Time: 05/09/24  5:13 PM   Specimen: Nasal Mucosa; Nasal Swab  Result Value Ref Range Status   MRSA by PCR Next Gen DETECTED (A) NOT DETECTED Final    Comment: RESULT CALLED TO, READ BACK BY AND VERIFIED WITH: RN DRISS ZINE [id number] FCP (NOTE) The GeneXpert MRSA Assay (FDA approved for NASAL specimens only), is one component of a comprehensive MRSA colonization surveillance program. It is not intended to diagnose MRSA infection nor to guide or monitor treatment for MRSA infections. Test performance is not FDA approved in patients less than 67 years old. Performed at [facility], [address] [place], [place] [address]      Time coordinating discharge: 40 minutes  SIGNED:   Renato Applebaum, MD  [facility] 05/17/2024, 11:16 AM

## 2024-05-16 NOTE — TOC Progression Note (Signed)
 Transition of Care [facility]) - Progression Note    Patient Details  Name: Cody Preston MRN: [id number] Date of Birth: December 28, 1965  Transition of Care [facility]) CM/SW Contact  Montie LOISE Louder, [place] Phone Number: 05/16/2024, 2:43 PM  Clinical Narrative:     [name] is pending, Mayme Barrows PI#[id number]   Expected Discharge Plan: Skilled Nursing Facility Barriers to Discharge: Other (must enter comment) (awaitng SNF bed offer)               Expected Discharge Plan and Services   Discharge Planning Services: CM Consult     Expected Discharge Date: 05/14/24               DME Arranged: Vannie rolling DME Agency: [employer] Date DME Agency Contacted: 05/11/24 Time DME Agency Contacted: 1309 Representative spoke with at DME Agency: [facility] Arranged: PT           Social Drivers of Health (SDOH) Interventions SDOH Screenings   Food Insecurity: No Food Insecurity (05/09/2024)  Housing: Low Risk  (05/09/2024)  Transportation Needs: No Transportation Needs (05/09/2024)  Utilities: Not At Risk (05/09/2024)  Tobacco Use: Low Risk  (05/09/2024)    Readmission Risk Interventions     No data to display

## 2024-05-16 NOTE — Telephone Encounter (Signed)
 I s/w pt and he tells me he is still in the hospital with broken leg. He will then be going to rehab for PT. Pt states he is going to popst pone his surgery with Dr. Nicholaus III for now. I assured the pt that I will update all parties involved of our conversation today. I did also inform the pt that when he and Dr. Nicholaus are ready to reschedule his surgery Dr. Nicholaus office will need to send new clearance request.   In the meantime I will remove from the preop call back pool. We will re-address when new request sent to our office when pt is ready to reschedule with Dr. Nicholaus.

## 2024-05-17 DIAGNOSIS — I499 Cardiac arrhythmia, unspecified: Secondary | ICD-10-CM | POA: Diagnosis not present

## 2024-05-17 DIAGNOSIS — I10 Essential (primary) hypertension: Secondary | ICD-10-CM | POA: Diagnosis not present

## 2024-05-17 DIAGNOSIS — Z741 Need for assistance with personal care: Secondary | ICD-10-CM | POA: Diagnosis not present

## 2024-05-17 DIAGNOSIS — Z4789 Encounter for other orthopedic aftercare: Secondary | ICD-10-CM | POA: Diagnosis not present

## 2024-05-17 DIAGNOSIS — S72002A Fracture of unspecified part of neck of left femur, initial encounter for closed fracture: Secondary | ICD-10-CM | POA: Diagnosis not present

## 2024-05-17 DIAGNOSIS — M6281 Muscle weakness (generalized): Secondary | ICD-10-CM | POA: Diagnosis not present

## 2024-05-17 DIAGNOSIS — Z7401 Bed confinement status: Secondary | ICD-10-CM | POA: Diagnosis not present

## 2024-05-17 DIAGNOSIS — I25119 Atherosclerotic heart disease of native coronary artery with unspecified angina pectoris: Secondary | ICD-10-CM | POA: Diagnosis not present

## 2024-05-17 DIAGNOSIS — I214 Non-ST elevation (NSTEMI) myocardial infarction: Secondary | ICD-10-CM | POA: Diagnosis not present

## 2024-05-17 DIAGNOSIS — R2681 Unsteadiness on feet: Secondary | ICD-10-CM | POA: Diagnosis not present

## 2024-05-17 DIAGNOSIS — Z743 Need for continuous supervision: Secondary | ICD-10-CM | POA: Diagnosis not present

## 2024-05-17 DIAGNOSIS — S72009D Fracture of unspecified part of neck of unspecified femur, subsequent encounter for closed fracture with routine healing: Secondary | ICD-10-CM

## 2024-05-17 DIAGNOSIS — I2510 Atherosclerotic heart disease of native coronary artery without angina pectoris: Secondary | ICD-10-CM | POA: Diagnosis not present

## 2024-05-17 DIAGNOSIS — E7800 Pure hypercholesterolemia, unspecified: Secondary | ICD-10-CM | POA: Diagnosis not present

## 2024-05-17 DIAGNOSIS — S72002S Fracture of unspecified part of neck of left femur, sequela: Secondary | ICD-10-CM | POA: Diagnosis not present

## 2024-05-17 DIAGNOSIS — E785 Hyperlipidemia, unspecified: Secondary | ICD-10-CM | POA: Diagnosis not present

## 2024-05-17 NOTE — Progress Notes (Signed)
 Mobility Specialist Progress Note:    05/17/24 1058  Mobility  Activity Ambulated with assistance in hallway  Level of Assistance Contact guard assist, steadying assist  Assistive Device Front wheel walker  Distance Ambulated (ft) 80 ft (x2)  LLE Weight Bearing Per Provider Order WBAT  Activity Response Tolerated well  Mobility Referral Yes  Mobility visit 1 Mobility  Mobility Specialist Start Time (ACUTE ONLY) 1030  Mobility Specialist Stop Time (ACUTE ONLY) 1043  Mobility Specialist Time Calculation (min) (ACUTE ONLY) 13 min   Received pt in chair and agreeable for mobility. Pt had a short gait distance, but required no physical assistance. C/o LLE pain, otherwise tolerated well. Returned pt to room and left in chair. Personal belongings and call light within reach. Visitors in room.  Lavanda Pollack Mobility Specialist  Please contact via [employer] or  Rehab Office [phone]

## 2024-05-17 NOTE — TOC Transition Note (Signed)
 Transition of Care [facility]) - Discharge Note   Patient Details  Name: Cody Preston MRN: [id number] Date of Birth: 1966-03-16  [facility]) CM/SW Contact:  Gwenn Julien Norris, LCSW Phone Number: 05/17/2024, 11:33 AM   Clinical Narrative:  Pt for dc to [facility] today. Spoke to Tanya in admissions who confirmed auth received ([id number], valid 7/24-7/28) and they are prepared to admit pt to room 124. Pt and [name] aware of dc and report agreeable. RN provided with number for report and PTAR arranged for transport. SW  signing off at dc.   Julien Gwenn, MSW, LCSW [phone] (coverage)      Final next level of care: Skilled Nursing Facility Barriers to Discharge: Barriers Resolved   Patient Goals and CMS Choice     Choice offered to / list presented to : Patient      Discharge Placement              Patient chooses bed at: Other - please specify in the comment section below: [facility]) Patient to be transferred to facility by: PTAR Name of family member notified: Pt to update family Patient and family notified of of transfer: 05/17/24  Discharge Plan and Services Additional resources added to the After Visit Summary for     Discharge Planning Services: CM Consult            DME Arranged: Vannie rolling DME Agency: [employer] Date DME Agency Contacted: 05/11/24 Time DME Agency Contacted: [phone] Representative spoke with at DME Agency: [facility] Arranged: PT          Social Drivers of Health (SDOH) Interventions SDOH Screenings   Food Insecurity: No Food Insecurity (05/09/2024)  Housing: Low Risk  (05/09/2024)  Transportation Needs: No Transportation Needs (05/09/2024)  Utilities: Not At Risk (05/09/2024)  Tobacco Use: Low Risk  (05/09/2024)     Readmission Risk Interventions     No data to display

## 2024-05-17 NOTE — Plan of Care (Signed)
  Problem: Education: Goal: Knowledge of medication regimen will be met for pain relief regimen by discharge Outcome: Progressing Goal: Understanding of ways to prevent infection will improve by discharge Outcome: Progressing   Problem: Coping: Goal: Ability to verbalize feelings will improve by discharge Outcome: Progressing Goal: Family members realistic understanding of the patients condition will improve by discharge Outcome: Progressing

## 2024-05-17 NOTE — TOC Progression Note (Signed)
 Transition of Care [facility]) - Progression Note    Patient Details  Name: Cody Preston MRN: [id number] Date of Birth: Nov 04, 1965  Transition of Care [facility]) CM/SW Contact  Montie LOISE Louder, [place] Phone Number: 05/17/2024, 9:51 AM  Clinical Narrative:     11:20 am Met with patient- informed [facility] unable to offer placement. Patient states somewhere in [place] was fine. CSW reviewed bed offers again with patient. He accepted bed offer with [facility]. CSW was given permission to contact his sister.  11:28 am - spoke with patient's sister, Erminio- informed of recommendations and SNF choice- [facility].   11:30 am- [facility]/Tonya- confirmed bed availability   CMA started insurance auth reference #[id number]   TOC will continue to follow and assist with discharge planning.  Montie Louder, MSW, LCSW Clinical Social Worker      Expected Discharge Plan: Skilled Nursing Facility Barriers to Discharge: Other (must enter comment) (awaitng SNF bed offer)               Expected Discharge Plan and Services   Discharge Planning Services: CM Consult     Expected Discharge Date: 05/14/24               DME Arranged: Vannie rolling DME Agency: [employer] Date DME Agency Contacted: 05/11/24 Time DME Agency Contacted: [id number] Representative spoke with at DME Agency: [facility] Arranged: PT           Social Drivers of Health (SDOH) Interventions SDOH Screenings   Food Insecurity: No Food Insecurity (05/09/2024)  Housing: Low Risk  (05/09/2024)  Transportation Needs: No Transportation Needs (05/09/2024)  Utilities: Not At Risk (05/09/2024)  Tobacco Use: Low Risk  (05/09/2024)    Readmission Risk Interventions     No data to display

## 2024-05-17 NOTE — Progress Notes (Signed)
 This nurse attempted to call report to [facility]. I was told that the nurse would be calling back due to being busy at the moment. This nurse left a call back number for her to return my call. Pt transported to [facility] via EMS.

## 2024-05-22 DIAGNOSIS — E785 Hyperlipidemia, unspecified: Secondary | ICD-10-CM | POA: Diagnosis not present

## 2024-05-22 DIAGNOSIS — I2510 Atherosclerotic heart disease of native coronary artery without angina pectoris: Secondary | ICD-10-CM | POA: Diagnosis not present

## 2024-05-22 DIAGNOSIS — S72002A Fracture of unspecified part of neck of left femur, initial encounter for closed fracture: Secondary | ICD-10-CM | POA: Diagnosis not present

## 2024-05-22 DIAGNOSIS — I10 Essential (primary) hypertension: Secondary | ICD-10-CM | POA: Diagnosis not present

## 2024-05-23 DIAGNOSIS — S72002S Fracture of unspecified part of neck of left femur, sequela: Secondary | ICD-10-CM | POA: Diagnosis not present

## 2024-05-23 DIAGNOSIS — Z4789 Encounter for other orthopedic aftercare: Secondary | ICD-10-CM | POA: Diagnosis not present

## 2024-05-24 DIAGNOSIS — Z4789 Encounter for other orthopedic aftercare: Secondary | ICD-10-CM | POA: Diagnosis not present

## 2024-05-28 DIAGNOSIS — I119 Hypertensive heart disease without heart failure: Secondary | ICD-10-CM | POA: Diagnosis not present

## 2024-05-28 DIAGNOSIS — I25110 Atherosclerotic heart disease of native coronary artery with unstable angina pectoris: Secondary | ICD-10-CM | POA: Diagnosis not present

## 2024-05-28 DIAGNOSIS — S72142D Displaced intertrochanteric fracture of left femur, subsequent encounter for closed fracture with routine healing: Secondary | ICD-10-CM | POA: Diagnosis not present

## 2024-05-28 DIAGNOSIS — R000 Tachycardia, unspecified: Secondary | ICD-10-CM | POA: Diagnosis not present

## 2024-05-28 DIAGNOSIS — I10 Essential (primary) hypertension: Secondary | ICD-10-CM | POA: Diagnosis not present

## 2024-05-28 DIAGNOSIS — E7800 Pure hypercholesterolemia, unspecified: Secondary | ICD-10-CM | POA: Diagnosis not present

## 2024-05-28 DIAGNOSIS — I25118 Atherosclerotic heart disease of native coronary artery with other forms of angina pectoris: Secondary | ICD-10-CM | POA: Diagnosis not present

## 2024-06-25 DIAGNOSIS — Z4789 Encounter for other orthopedic aftercare: Secondary | ICD-10-CM | POA: Diagnosis not present

## 2024-06-25 DIAGNOSIS — S72002S Fracture of unspecified part of neck of left femur, sequela: Secondary | ICD-10-CM | POA: Diagnosis not present

## 2024-07-02 DIAGNOSIS — R000 Tachycardia, unspecified: Secondary | ICD-10-CM | POA: Diagnosis not present

## 2024-07-02 DIAGNOSIS — S71002D Unspecified open wound, left hip, subsequent encounter: Secondary | ICD-10-CM | POA: Diagnosis not present

## 2024-07-02 DIAGNOSIS — M25552 Pain in left hip: Secondary | ICD-10-CM | POA: Diagnosis not present

## 2024-07-03 ENCOUNTER — Other Ambulatory Visit: Payer: Self-pay

## 2024-07-03 DIAGNOSIS — I25118 Atherosclerotic heart disease of native coronary artery with other forms of angina pectoris: Secondary | ICD-10-CM

## 2024-07-05 MED ORDER — NITROGLYCERIN 0.4 MG SL SUBL: 0.4000 mg | SUBLINGUAL_TABLET | SUBLINGUAL | 1 refills | Status: AC | PRN

## 2024-07-10 DIAGNOSIS — Z4789 Encounter for other orthopedic aftercare: Secondary | ICD-10-CM | POA: Diagnosis not present

## 2024-07-10 DIAGNOSIS — M25562 Pain in left knee: Secondary | ICD-10-CM | POA: Diagnosis not present

## 2024-08-23 DIAGNOSIS — Z4789 Encounter for other orthopedic aftercare: Secondary | ICD-10-CM | POA: Diagnosis not present

## 2024-08-23 DIAGNOSIS — S72002S Fracture of unspecified part of neck of left femur, sequela: Secondary | ICD-10-CM | POA: Diagnosis not present

## 2024-11-13 ENCOUNTER — Other Ambulatory Visit: Payer: Self-pay | Admitting: Cardiology
